# Patient Record
Sex: Female | Born: 1965 | Race: Black or African American | Hispanic: Yes | Smoking: Current every day smoker
Health system: Southern US, Community
[De-identification: ages and names within clinical notes are randomized; demographics above are authoritative.]

## PROBLEM LIST (undated history)

## (undated) DIAGNOSIS — M51369 Other intervertebral disc degeneration, lumbar region without mention of lumbar back pain or lower extremity pain: Secondary | ICD-10-CM

## (undated) DIAGNOSIS — I10 Essential (primary) hypertension: Secondary | ICD-10-CM

## (undated) DIAGNOSIS — R7303 Prediabetes: Secondary | ICD-10-CM

## (undated) DIAGNOSIS — F419 Anxiety disorder, unspecified: Secondary | ICD-10-CM

## (undated) DIAGNOSIS — F319 Bipolar disorder, unspecified: Secondary | ICD-10-CM

## (undated) DIAGNOSIS — F4310 Post-traumatic stress disorder, unspecified: Secondary | ICD-10-CM

## (undated) DIAGNOSIS — F32A Depression, unspecified: Secondary | ICD-10-CM

## (undated) DIAGNOSIS — G8929 Other chronic pain: Secondary | ICD-10-CM

## (undated) DIAGNOSIS — M5136 Other intervertebral disc degeneration, lumbar region: Secondary | ICD-10-CM

## (undated) HISTORY — DX: Depression, unspecified: F32.A

## (undated) HISTORY — DX: Prediabetes: R73.03

## (undated) HISTORY — DX: Other chronic pain: G89.29

## (undated) HISTORY — DX: Bipolar disorder, unspecified: F31.9

## (undated) HISTORY — PX: OTHER SURGICAL HISTORY: SHX169

## (undated) HISTORY — DX: Anxiety disorder, unspecified: F41.9

## (undated) HISTORY — PX: COLONOSCOPY: SHX174

## (undated) HISTORY — PX: CARPAL TUNNEL RELEASE: SHX101

## (undated) HISTORY — PX: PARTIAL HYSTERECTOMY: SHX80

## (undated) HISTORY — DX: Post-traumatic stress disorder, unspecified: F43.10

---

## 2004-12-19 ENCOUNTER — Ambulatory Visit: Payer: Self-pay | Admitting: Family Medicine

## 2005-06-27 ENCOUNTER — Ambulatory Visit (HOSPITAL_COMMUNITY): Admission: RE | Admit: 2005-06-27 | Discharge: 2005-06-27 | Payer: Self-pay | Admitting: General Surgery

## 2007-02-05 ENCOUNTER — Inpatient Hospital Stay (HOSPITAL_COMMUNITY): Admission: EM | Admit: 2007-02-05 | Discharge: 2007-02-07 | Payer: Self-pay | Admitting: Emergency Medicine

## 2007-02-05 ENCOUNTER — Encounter (INDEPENDENT_AMBULATORY_CARE_PROVIDER_SITE_OTHER): Payer: Self-pay | Admitting: Family Medicine

## 2007-02-17 ENCOUNTER — Ambulatory Visit: Payer: Self-pay | Admitting: Family Medicine

## 2007-02-17 DIAGNOSIS — D509 Iron deficiency anemia, unspecified: Secondary | ICD-10-CM | POA: Insufficient documentation

## 2007-02-17 DIAGNOSIS — M79609 Pain in unspecified limb: Secondary | ICD-10-CM | POA: Insufficient documentation

## 2007-02-17 DIAGNOSIS — I10 Essential (primary) hypertension: Secondary | ICD-10-CM | POA: Insufficient documentation

## 2007-02-17 DIAGNOSIS — F329 Major depressive disorder, single episode, unspecified: Secondary | ICD-10-CM | POA: Insufficient documentation

## 2007-02-17 LAB — CONVERTED CEMR LAB: Hemoglobin: 10.2 g/dL

## 2007-02-19 ENCOUNTER — Encounter: Payer: Self-pay | Admitting: Family Medicine

## 2007-03-04 ENCOUNTER — Ambulatory Visit: Payer: Self-pay | Admitting: Family Medicine

## 2007-03-04 DIAGNOSIS — F17200 Nicotine dependence, unspecified, uncomplicated: Secondary | ICD-10-CM | POA: Insufficient documentation

## 2007-03-04 LAB — CONVERTED CEMR LAB
Bilirubin Urine: NEGATIVE
Blood in Urine, dipstick: NEGATIVE
Glucose, Urine, Semiquant: NEGATIVE
Nitrite: NEGATIVE
Protein, U semiquant: 30
Specific Gravity, Urine: >=1.03
Urobilinogen, UA: 0.2
pH: 6

## 2007-03-05 ENCOUNTER — Telehealth (INDEPENDENT_AMBULATORY_CARE_PROVIDER_SITE_OTHER): Payer: Self-pay | Admitting: *Deleted

## 2007-03-05 ENCOUNTER — Encounter (INDEPENDENT_AMBULATORY_CARE_PROVIDER_SITE_OTHER): Payer: Self-pay | Admitting: Family Medicine

## 2007-03-05 LAB — CONVERTED CEMR LAB
ALT: 16 units/L (ref 0–35)
AST: 15 units/L (ref 0–37)
Albumin: 4.6 g/dL (ref 3.5–5.2)
Alkaline Phosphatase: 70 units/L (ref 39–117)
BUN: 10 mg/dL (ref 6–23)
Basophils Absolute: 0 10*3/uL (ref 0.0–0.1)
Basophils Relative: 0 % (ref 0–1)
CO2: 20 meq/L (ref 19–32)
Calcium: 9.5 mg/dL (ref 8.4–10.5)
Chloride: 107 meq/L (ref 96–112)
Cholesterol: 138 mg/dL (ref 0–200)
Creatinine, Ser: 0.89 mg/dL (ref 0.40–1.20)
Eosinophils Absolute: 0 10*3/uL (ref 0.0–0.7)
Eosinophils Relative: 1 % (ref 0–5)
Glucose, Bld: 101 mg/dL — ABNORMAL HIGH (ref 70–99)
HCT: 36.4 % (ref 36.0–46.0)
HDL: 35 mg/dL — ABNORMAL LOW (ref >39)
Hemoglobin: 10.9 g/dL — ABNORMAL LOW (ref 12.0–15.0)
LDL Cholesterol: 93 mg/dL (ref 0–99)
Lymphocytes Relative: 39 % (ref 12–46)
Lymphs Abs: 2 10*3/uL (ref 0.7–4.0)
MCHC: 29.9 g/dL — ABNORMAL LOW (ref 30.0–36.0)
MCV: 68.5 fL — ABNORMAL LOW (ref 78.0–100.0)
Monocytes Absolute: 0.2 10*3/uL (ref 0.1–1.0)
Monocytes Relative: 4 % (ref 3–12)
Neutro Abs: 2.9 10*3/uL (ref 1.7–7.7)
Neutrophils Relative %: 56 % (ref 43–77)
Platelets: 627 10*3/uL — ABNORMAL HIGH (ref 150–400)
Potassium: 3.9 meq/L (ref 3.5–5.3)
RBC: 5.31 M/uL — ABNORMAL HIGH (ref 3.87–5.11)
Sodium: 139 meq/L (ref 135–145)
TSH: 0.452 microintl units/mL (ref 0.350–5.50)
Total Bilirubin: 1 mg/dL (ref 0.3–1.2)
Total CHOL/HDL Ratio: 3.9
Total Protein: 7.5 g/dL (ref 6.0–8.3)
Triglycerides: 48 mg/dL (ref <150)
VLDL: 10 mg/dL (ref 0–40)
WBC: 5.2 10*3/uL (ref 4.0–10.5)

## 2007-03-10 ENCOUNTER — Encounter (INDEPENDENT_AMBULATORY_CARE_PROVIDER_SITE_OTHER): Payer: Self-pay | Admitting: Family Medicine

## 2007-03-10 ENCOUNTER — Encounter (HOSPITAL_COMMUNITY): Admission: RE | Admit: 2007-03-10 | Discharge: 2007-04-09 | Payer: Self-pay | Admitting: Oncology

## 2007-03-10 ENCOUNTER — Ambulatory Visit (HOSPITAL_COMMUNITY): Payer: Self-pay | Admitting: Oncology

## 2007-03-27 ENCOUNTER — Ambulatory Visit (HOSPITAL_COMMUNITY): Admission: RE | Admit: 2007-03-27 | Discharge: 2007-03-27 | Payer: Self-pay | Admitting: Oncology

## 2007-04-02 ENCOUNTER — Ambulatory Visit: Payer: Self-pay | Admitting: Family Medicine

## 2007-04-02 ENCOUNTER — Telehealth (INDEPENDENT_AMBULATORY_CARE_PROVIDER_SITE_OTHER): Payer: Self-pay | Admitting: *Deleted

## 2007-04-02 LAB — CONVERTED CEMR LAB

## 2007-04-08 ENCOUNTER — Encounter (INDEPENDENT_AMBULATORY_CARE_PROVIDER_SITE_OTHER): Payer: Self-pay | Admitting: Family Medicine

## 2007-04-13 ENCOUNTER — Encounter (HOSPITAL_COMMUNITY): Admission: RE | Admit: 2007-04-13 | Discharge: 2007-05-13 | Payer: Self-pay | Admitting: Oncology

## 2007-05-04 ENCOUNTER — Telehealth (INDEPENDENT_AMBULATORY_CARE_PROVIDER_SITE_OTHER): Payer: Self-pay | Admitting: Family Medicine

## 2007-05-04 ENCOUNTER — Emergency Department (HOSPITAL_COMMUNITY): Admission: EM | Admit: 2007-05-04 | Discharge: 2007-05-04 | Payer: Self-pay | Admitting: Emergency Medicine

## 2007-05-07 ENCOUNTER — Ambulatory Visit: Payer: Self-pay | Admitting: Family Medicine

## 2007-05-08 ENCOUNTER — Encounter (INDEPENDENT_AMBULATORY_CARE_PROVIDER_SITE_OTHER): Payer: Self-pay | Admitting: Family Medicine

## 2007-05-13 ENCOUNTER — Inpatient Hospital Stay (HOSPITAL_COMMUNITY): Admission: RE | Admit: 2007-05-13 | Discharge: 2007-05-15 | Payer: Self-pay | Admitting: Obstetrics & Gynecology

## 2007-05-13 ENCOUNTER — Encounter: Payer: Self-pay | Admitting: Obstetrics & Gynecology

## 2007-05-21 ENCOUNTER — Telehealth (INDEPENDENT_AMBULATORY_CARE_PROVIDER_SITE_OTHER): Payer: Self-pay | Admitting: *Deleted

## 2007-05-22 ENCOUNTER — Encounter (HOSPITAL_COMMUNITY): Admission: RE | Admit: 2007-05-22 | Discharge: 2007-06-21 | Payer: Self-pay | Admitting: Oncology

## 2007-05-22 ENCOUNTER — Ambulatory Visit (HOSPITAL_COMMUNITY): Payer: Self-pay | Admitting: Oncology

## 2007-05-25 ENCOUNTER — Encounter (INDEPENDENT_AMBULATORY_CARE_PROVIDER_SITE_OTHER): Payer: Self-pay | Admitting: Family Medicine

## 2007-06-04 ENCOUNTER — Emergency Department (HOSPITAL_COMMUNITY): Admission: EM | Admit: 2007-06-04 | Discharge: 2007-06-05 | Payer: Self-pay | Admitting: Emergency Medicine

## 2007-06-05 ENCOUNTER — Ambulatory Visit: Payer: Self-pay | Admitting: Family Medicine

## 2007-06-05 DIAGNOSIS — F410 Panic disorder [episodic paroxysmal anxiety] without agoraphobia: Secondary | ICD-10-CM | POA: Insufficient documentation

## 2007-06-08 ENCOUNTER — Telehealth (INDEPENDENT_AMBULATORY_CARE_PROVIDER_SITE_OTHER): Payer: Self-pay | Admitting: Family Medicine

## 2007-06-15 ENCOUNTER — Telehealth (INDEPENDENT_AMBULATORY_CARE_PROVIDER_SITE_OTHER): Payer: Self-pay | Admitting: *Deleted

## 2007-06-15 ENCOUNTER — Ambulatory Visit: Payer: Self-pay | Admitting: Family Medicine

## 2007-06-26 ENCOUNTER — Encounter
Admission: RE | Admit: 2007-06-26 | Discharge: 2007-07-01 | Payer: Self-pay | Admitting: Physical Medicine and Rehabilitation

## 2007-06-29 ENCOUNTER — Ambulatory Visit: Payer: Self-pay | Admitting: Family Medicine

## 2007-06-29 DIAGNOSIS — F1210 Cannabis abuse, uncomplicated: Secondary | ICD-10-CM | POA: Insufficient documentation

## 2007-06-30 ENCOUNTER — Encounter (INDEPENDENT_AMBULATORY_CARE_PROVIDER_SITE_OTHER): Payer: Self-pay | Admitting: Family Medicine

## 2007-06-30 ENCOUNTER — Telehealth (INDEPENDENT_AMBULATORY_CARE_PROVIDER_SITE_OTHER): Payer: Self-pay | Admitting: *Deleted

## 2007-06-30 LAB — CONVERTED CEMR LAB
BUN: 11 mg/dL (ref 6–23)
CO2: 20 meq/L (ref 19–32)
Calcium: 9 mg/dL (ref 8.4–10.5)
Chloride: 111 meq/L (ref 96–112)
Creatinine, Ser: 0.81 mg/dL (ref 0.40–1.20)
Glucose, Bld: 92 mg/dL (ref 70–99)
Potassium: 3.7 meq/L (ref 3.5–5.3)
Sodium: 141 meq/L (ref 135–145)

## 2007-07-01 ENCOUNTER — Encounter (INDEPENDENT_AMBULATORY_CARE_PROVIDER_SITE_OTHER): Payer: Self-pay | Admitting: Family Medicine

## 2007-07-01 ENCOUNTER — Telehealth (INDEPENDENT_AMBULATORY_CARE_PROVIDER_SITE_OTHER): Payer: Self-pay | Admitting: Family Medicine

## 2007-07-01 ENCOUNTER — Ambulatory Visit (HOSPITAL_COMMUNITY): Admission: RE | Admit: 2007-07-01 | Discharge: 2007-07-01 | Payer: Self-pay | Admitting: Obstetrics and Gynecology

## 2007-07-01 ENCOUNTER — Ambulatory Visit: Payer: Self-pay | Admitting: Physical Medicine and Rehabilitation

## 2007-07-09 ENCOUNTER — Encounter (INDEPENDENT_AMBULATORY_CARE_PROVIDER_SITE_OTHER): Payer: Self-pay | Admitting: Family Medicine

## 2007-07-10 ENCOUNTER — Telehealth (INDEPENDENT_AMBULATORY_CARE_PROVIDER_SITE_OTHER): Payer: Self-pay | Admitting: Family Medicine

## 2007-08-18 ENCOUNTER — Encounter (INDEPENDENT_AMBULATORY_CARE_PROVIDER_SITE_OTHER): Payer: Self-pay | Admitting: Family Medicine

## 2008-04-02 ENCOUNTER — Emergency Department (HOSPITAL_COMMUNITY): Admission: EM | Admit: 2008-04-02 | Discharge: 2008-04-02 | Payer: Self-pay | Admitting: Emergency Medicine

## 2008-04-11 ENCOUNTER — Ambulatory Visit (HOSPITAL_BASED_OUTPATIENT_CLINIC_OR_DEPARTMENT_OTHER): Admission: RE | Admit: 2008-04-11 | Discharge: 2008-04-11 | Payer: Self-pay | Admitting: Otolaryngology

## 2008-05-23 ENCOUNTER — Ambulatory Visit (HOSPITAL_COMMUNITY): Payer: Self-pay | Admitting: Oncology

## 2008-10-06 ENCOUNTER — Inpatient Hospital Stay (HOSPITAL_COMMUNITY): Admission: RE | Admit: 2008-10-06 | Discharge: 2008-10-07 | Payer: Self-pay | Admitting: Orthopaedic Surgery

## 2008-11-08 ENCOUNTER — Encounter (HOSPITAL_COMMUNITY): Admission: RE | Admit: 2008-11-08 | Discharge: 2008-11-17 | Payer: Self-pay | Admitting: Orthopaedic Surgery

## 2008-11-22 ENCOUNTER — Encounter (HOSPITAL_COMMUNITY): Admission: RE | Admit: 2008-11-22 | Discharge: 2008-12-22 | Payer: Self-pay | Admitting: Orthopaedic Surgery

## 2008-12-06 ENCOUNTER — Emergency Department (HOSPITAL_COMMUNITY): Admission: EM | Admit: 2008-12-06 | Discharge: 2008-12-06 | Payer: Self-pay | Admitting: Emergency Medicine

## 2008-12-23 ENCOUNTER — Encounter (HOSPITAL_COMMUNITY): Admission: RE | Admit: 2008-12-23 | Discharge: 2009-01-22 | Payer: Self-pay | Admitting: Orthopaedic Surgery

## 2009-01-24 ENCOUNTER — Ambulatory Visit (HOSPITAL_COMMUNITY): Admission: RE | Admit: 2009-01-24 | Discharge: 2009-01-24 | Payer: Self-pay

## 2009-01-24 ENCOUNTER — Encounter (HOSPITAL_COMMUNITY): Admission: RE | Admit: 2009-01-24 | Discharge: 2009-02-17 | Payer: Self-pay | Admitting: Orthopaedic Surgery

## 2009-02-21 ENCOUNTER — Encounter (HOSPITAL_COMMUNITY): Admission: RE | Admit: 2009-02-21 | Discharge: 2009-03-23 | Payer: Self-pay | Admitting: Orthopaedic Surgery

## 2009-03-12 ENCOUNTER — Emergency Department (HOSPITAL_COMMUNITY): Admission: EM | Admit: 2009-03-12 | Discharge: 2009-03-12 | Payer: Self-pay | Admitting: Emergency Medicine

## 2009-03-28 ENCOUNTER — Encounter (HOSPITAL_COMMUNITY): Admission: RE | Admit: 2009-03-28 | Discharge: 2009-04-27 | Payer: Self-pay | Admitting: Orthopaedic Surgery

## 2009-04-28 ENCOUNTER — Encounter (HOSPITAL_COMMUNITY): Admission: RE | Admit: 2009-04-28 | Discharge: 2009-05-28 | Payer: Self-pay | Admitting: Orthopaedic Surgery

## 2009-07-26 ENCOUNTER — Ambulatory Visit (HOSPITAL_COMMUNITY)
Admission: RE | Admit: 2009-07-26 | Discharge: 2009-07-26 | Payer: Self-pay | Admitting: Physical Medicine and Rehabilitation

## 2009-08-07 ENCOUNTER — Emergency Department (HOSPITAL_COMMUNITY): Admission: EM | Admit: 2009-08-07 | Discharge: 2009-08-07 | Payer: Self-pay | Admitting: Emergency Medicine

## 2009-09-17 ENCOUNTER — Emergency Department (HOSPITAL_COMMUNITY): Admission: EM | Admit: 2009-09-17 | Discharge: 2009-09-17 | Payer: Self-pay | Admitting: Emergency Medicine

## 2009-11-28 ENCOUNTER — Encounter
Admission: RE | Admit: 2009-11-28 | Discharge: 2009-11-28 | Payer: Self-pay | Admitting: Physical Medicine and Rehabilitation

## 2010-03-20 NOTE — Letter (Signed)
Summary: Letter lab/xray  Letter lab/xray   Imported By: Curtis Sites 07/02/2007 09:47:33  _____________________________________________________________________  External Attachment:    Type:   Image     Comment:   External Document

## 2010-03-20 NOTE — Progress Notes (Signed)
Summary: PRESCRIPTION  Phone Note Call from Patient   Caller: Patient Summary of Call: PATIENT HAS LOST HER PRESCRIPTION FOR LABETALOL HCL 400MG , SHE SAID HE SWITCHED HER TO 400MG  HER LAST VISIT. SHE SAID YOU COULD CALL IT INTO [facility] [place] THERE NUMBER IS [id number].  HER NUMBER IS [id number]. Initial call taken by: Curtis Sites,  May 21, 2007 11:24 AM  Follow-up for Phone Call        script called into pharmacy. Follow-up by: Sherilyn Banker,  May 21, 2007 11:45 AM
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
## 2010-03-20 NOTE — Progress Notes (Signed)
Summary: Going to [place], not sure when will be back  Phone Note Call from Patient   Reason for Call: Talk to Nurse Summary of Call: patient called asking for Selena Batten,  I advised her that she wasn't here and she said she needed to cancel her appt for June 30th and any future appts because she was in [place]. Her mother is in the hospital and might need to go to a nursing home and didn't know when she would be coming back home. She said she had enough medication to last her but she was going to see her other dr and see if he would give her a Rx for her Exforge because we only gave her samples of it. I advised her that she would need to let us know when she gets back so I can request that note from that dr so we can have it for when she follows up with dr Erby Pian. Initial call taken by: Donneta Romberg,  Jul 10, 2007 9:05 AM  Follow-up for Phone Call        Noted Follow-up by: Franchot Heidelberg MD,  Jul 10, 2007 2:13 PM
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
## 2010-03-20 NOTE — Letter (Signed)
Summary: Discharge Summary  Discharge Summary   Imported By: Donneta Romberg 02/20/2007 15:39:31  _____________________________________________________________________  External Attachment:    Type:   Image     Comment:   External Document

## 2010-03-20 NOTE — Consult Note (Signed)
Summary: Pain Clinic  Pain Clinic   Imported By: Altamease Oiler 07/29/2007 10:47:17  _____________________________________________________________________  External Attachment:    Type:   Image     Comment:   External Document
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
## 2010-03-20 NOTE — Progress Notes (Signed)
Summary: called to check on pt  Phone Note Outgoing Call   Summary of Call: Caled to check on pt. She states that she is feeling much better and she was very appreciative that we worked her in last fri. She will keep her appt on Apr 27th. Initial call taken by: Sherilyn Banker,  June 08, 2007 10:32 AM  Follow-up for Phone Call        Noted. Follow-up by: Franchot Heidelberg MD,  June 08, 2007 10:38 AM
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
## 2010-03-20 NOTE — Progress Notes (Signed)
Summary: [facility] referral  Phone Note Outgoing Call   Call placed by: Sonny Dandy,  June 15, 2007 11:08 AM Summary of Call: records faxed to [facility], message left for patient to return call on answering machine .................................................................Marland KitchenMarland KitchenSonny Dandy  June 15, 2007 11:09 AM  referral info given to patient Sonny Dandy  June 17, 2007 11:19 AM  appointment set for 08/04/07 at 1200pm [facility]  June 18, 2007 8:04 AM  Initial call taken by: Sonny Dandy,  June 18, 2007 8:04 AM

## 2010-03-20 NOTE — Letter (Signed)
Summary: rpc chart  rpc chart   Imported By: Curtis Sites 11/22/2009 17:00:15  _____________________________________________________________________  External Attachment:    Type:   Image     Comment:   External Document
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
## 2010-03-20 NOTE — Letter (Signed)
Summary: Med list  [facility]  [address]   [place], [place] [address]   Phone: [phone]  Fax: [phone]    Patient Information  For [name]         Your current medications include:  1)  EXFORGE 10-320 MG  TABS (AMLODIPINE BESYLATE-VALSARTAN) One daily 2)  LABETALOL HCL 200 MG  TABS (LABETALOL HCL) Two two times a day 3)  KLONOPIN 1 MG  TABS (CLONAZEPAM) two times a day 4)  ZOLOFT 50 MG  TABS (SERTRALINE HCL) One at bedtime 5)  NEURONTIN 600 MG  TABS (GABAPENTIN) three times a day     Please contact us if any concerns.  Thank you    CF

## 2010-03-20 NOTE — Letter (Signed)
[facility]   Imported By: Curtis Sites 06/05/2007 16:32:17  _____________________________________________________________________  External Attachment:    Type:   Image     Comment:   External Document

## 2010-03-20 NOTE — Progress Notes (Signed)
Summary: Cancer Center Note  Cancer Center Note   Imported By: Lutricia Horsfall 03/19/2007 15:50:06  _____________________________________________________________________  External Attachment:    Type:   Image     Comment:   External Document
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
## 2010-03-20 NOTE — Letter (Signed)
Summary: dss  dss   Imported By: Curtis Sites 07/29/2007 13:37:27  _____________________________________________________________________  External Attachment:    Type:   Image     Comment:   External Document

## 2010-03-20 NOTE — Progress Notes (Signed)
Summary: Pain Clinic referral  Phone Note Outgoing Call   Call placed by: Sonny Dandy,  April 02, 2007 9:52 AM Summary of Call: records faxed to [facility] in [place], patient notified. .................................................................Marland KitchenMarland KitchenBritt Bottom Long  April 02, 2007 9:53 AM  checked with pain clinic and no appointment has been set as yet .................................................................Marland KitchenMarland KitchenSonny Dandy  April 09, 2007 3:42 PM   called and still in the review process! .................................................................Marland KitchenMarland KitchenBritt Bottom Long  April 14, 2007 1:18 PM  called and asked about referral and it is still in review! .................................................................Marland KitchenMarland KitchenSonny Dandy  April 23, 2007 3:44 PM   Follow-up for Phone Call        recieved appointment for patient 04/29/07 at 830am, patient notified. Follow-up by: Sonny Dandy,  April 24, 2007 1:07 PM

## 2010-03-20 NOTE — Letter (Signed)
Summary: [facility]  [facility]   Imported By: Curtis Sites 06/09/2007 11:48:19  _____________________________________________________________________  External Attachment:    Type:   Image     Comment:   External Document

## 2010-03-20 NOTE — Progress Notes (Signed)
Summary: 06/29/07 lab results letter mailed  Phone Note Outgoing Call   Call placed by: Sonny Dandy,  Jun 30, 2007 8:49 AM Summary of Call: message left for patient to return call on answering machine Sonny Dandy  Jun 30, 2007 8:49 AM  called, no answer, letter mailed Sonny Dandy  Jul 01, 2007 8:02 AM  Initial call taken by: Sonny Dandy,  Jul 01, 2007 8:02 AM

## 2010-03-23 NOTE — Letter (Signed)
Summary: Second Missed Appointment  Second Missed Appointment   Imported By: Lutricia Horsfall 09/03/2007 15:28:30  _____________________________________________________________________  External Attachment:    Type:   Image     Comment:   External Document

## 2010-05-05 LAB — URINALYSIS, ROUTINE W REFLEX MICROSCOPIC
Bilirubin Urine: NEGATIVE
Glucose, UA: NEGATIVE mg/dL
Hgb urine dipstick: NEGATIVE
Ketones, ur: NEGATIVE mg/dL
Nitrite: NEGATIVE
Protein, ur: NEGATIVE mg/dL
Specific Gravity, Urine: 1.02 (ref 1.005–1.030)
Urobilinogen, UA: 0.2 mg/dL (ref 0.0–1.0)
pH: 7 (ref 5.0–8.0)

## 2010-05-05 LAB — BASIC METABOLIC PANEL
BUN: 10 mg/dL (ref 6–23)
CO2: 25 mEq/L (ref 19–32)
Calcium: 9.3 mg/dL (ref 8.4–10.5)
Chloride: 107 mEq/L (ref 96–112)
Creatinine, Ser: 0.72 mg/dL (ref 0.4–1.2)
GFR calc Af Amer: >60 mL/min (ref >60)
GFR calc non Af Amer: >60 mL/min (ref >60)
Glucose, Bld: 115 mg/dL — ABNORMAL HIGH (ref 70–99)
Potassium: 4.1 mEq/L (ref 3.5–5.1)
Sodium: 136 mEq/L (ref 135–145)

## 2010-05-05 LAB — CBC
HCT: 42.3 % (ref 36.0–46.0)
Hemoglobin: 13.8 g/dL (ref 12.0–15.0)
MCH: 28.2 pg (ref 26.0–34.0)
MCHC: 32.7 g/dL (ref 30.0–36.0)
MCV: 86.2 fL (ref 78.0–100.0)
Platelets: 383 10*3/uL (ref 150–400)
RBC: 4.9 MIL/uL (ref 3.87–5.11)
RDW: 15.8 % — ABNORMAL HIGH (ref 11.5–15.5)
WBC: 4.9 10*3/uL (ref 4.0–10.5)

## 2010-05-05 LAB — DIFFERENTIAL
Basophils Absolute: 0 10*3/uL (ref 0.0–0.1)
Basophils Relative: 0 % (ref 0–1)
Eosinophils Absolute: 0 10*3/uL (ref 0.0–0.7)
Eosinophils Relative: 1 % (ref 0–5)
Lymphocytes Relative: 32 % (ref 12–46)
Lymphs Abs: 1.6 10*3/uL (ref 0.7–4.0)
Monocytes Absolute: 0.3 10*3/uL (ref 0.1–1.0)
Monocytes Relative: 6 % (ref 3–12)
Neutro Abs: 3 10*3/uL (ref 1.7–7.7)
Neutrophils Relative %: 61 % (ref 43–77)

## 2010-05-06 LAB — COMPREHENSIVE METABOLIC PANEL
ALT: 24 U/L (ref 0–35)
AST: 19 U/L (ref 0–37)
Albumin: 4.2 g/dL (ref 3.5–5.2)
Alkaline Phosphatase: 73 U/L (ref 39–117)
BUN: 7 mg/dL (ref 6–23)
CO2: 23 mEq/L (ref 19–32)
Calcium: 8.8 mg/dL (ref 8.4–10.5)
Chloride: 103 mEq/L (ref 96–112)
Creatinine, Ser: 0.75 mg/dL (ref 0.4–1.2)
GFR calc Af Amer: >60 mL/min (ref >60)
GFR calc non Af Amer: >60 mL/min (ref >60)
Glucose, Bld: 111 mg/dL — ABNORMAL HIGH (ref 70–99)
Potassium: 3.6 mEq/L (ref 3.5–5.1)
Sodium: 137 mEq/L (ref 135–145)
Total Bilirubin: 0.6 mg/dL (ref 0.3–1.2)
Total Protein: 7.4 g/dL (ref 6.0–8.3)

## 2010-05-06 LAB — CBC
HCT: 44.8 % (ref 36.0–46.0)
HCT: 47.4 % — ABNORMAL HIGH (ref 36.0–46.0)
Hemoglobin: 14.3 g/dL (ref 12.0–15.0)
Hemoglobin: 15.5 g/dL — ABNORMAL HIGH (ref 12.0–15.0)
MCHC: 32 g/dL (ref 30.0–36.0)
MCHC: 32.7 g/dL (ref 30.0–36.0)
MCV: 84.8 fL (ref 78.0–100.0)
MCV: 86.5 fL (ref 78.0–100.0)
Platelets: 346 10*3/uL (ref 150–400)
Platelets: 361 10*3/uL (ref 150–400)
RBC: 5.28 MIL/uL — ABNORMAL HIGH (ref 3.87–5.11)
RBC: 5.48 MIL/uL — ABNORMAL HIGH (ref 3.87–5.11)
RDW: 16.4 % — ABNORMAL HIGH (ref 11.5–15.5)
RDW: 15 % (ref 11.5–15.5)
WBC: 4.1 10*3/uL (ref 4.0–10.5)
WBC: 4.3 10*3/uL (ref 4.0–10.5)

## 2010-05-06 LAB — URINALYSIS, ROUTINE W REFLEX MICROSCOPIC
Bilirubin Urine: NEGATIVE
Glucose, UA: NEGATIVE mg/dL
Glucose, UA: NEGATIVE mg/dL
Hgb urine dipstick: NEGATIVE
Hgb urine dipstick: NEGATIVE
Ketones, ur: NEGATIVE mg/dL
Nitrite: NEGATIVE
Nitrite: NEGATIVE
Protein, ur: NEGATIVE mg/dL
Protein, ur: NEGATIVE mg/dL
Specific Gravity, Urine: 1.025 (ref 1.005–1.030)
Specific Gravity, Urine: >1.03 — ABNORMAL HIGH (ref 1.005–1.030)
Urobilinogen, UA: 0.2 mg/dL (ref 0.0–1.0)
Urobilinogen, UA: 1 mg/dL (ref 0.0–1.0)
pH: 5.5 (ref 5.0–8.0)
pH: 5.5 (ref 5.0–8.0)

## 2010-05-06 LAB — POCT CARDIAC MARKERS
CKMB, poc: <1 ng/mL — ABNORMAL LOW (ref 1.0–8.0)
Myoglobin, poc: 139 ng/mL (ref 12–200)
Troponin i, poc: <0.05 ng/mL (ref 0.00–0.09)

## 2010-05-06 LAB — DIFFERENTIAL
Basophils Absolute: 0 10*3/uL (ref 0.0–0.1)
Basophils Absolute: 0 10*3/uL (ref 0.0–0.1)
Basophils Relative: 0 % (ref 0–1)
Basophils Relative: 1 % (ref 0–1)
Eosinophils Absolute: 0 10*3/uL (ref 0.0–0.7)
Eosinophils Absolute: 0 10*3/uL (ref 0.0–0.7)
Eosinophils Relative: 1 % (ref 0–5)
Eosinophils Relative: 0 % (ref 0–5)
Lymphocytes Relative: 46 % (ref 12–46)
Lymphocytes Relative: 50 % — ABNORMAL HIGH (ref 12–46)
Lymphs Abs: 1.9 10*3/uL (ref 0.7–4.0)
Lymphs Abs: 2.1 10*3/uL (ref 0.7–4.0)
Monocytes Absolute: 0.2 10*3/uL (ref 0.1–1.0)
Monocytes Absolute: 0.3 10*3/uL (ref 0.1–1.0)
Monocytes Relative: 5 % (ref 3–12)
Monocytes Relative: 8 % (ref 3–12)
Neutro Abs: 1.9 10*3/uL (ref 1.7–7.7)
Neutro Abs: 1.8 10*3/uL (ref 1.7–7.7)
Neutrophils Relative %: 47 % (ref 43–77)
Neutrophils Relative %: 42 % — ABNORMAL LOW (ref 43–77)

## 2010-05-06 LAB — BASIC METABOLIC PANEL
BUN: 25 mg/dL — ABNORMAL HIGH (ref 6–23)
CO2: 28 mEq/L (ref 19–32)
Calcium: 9.9 mg/dL (ref 8.4–10.5)
Chloride: 98 mEq/L (ref 96–112)
Creatinine, Ser: 1.64 mg/dL — ABNORMAL HIGH (ref 0.4–1.2)
GFR calc Af Amer: 41 mL/min — ABNORMAL LOW (ref >60)
GFR calc non Af Amer: 34 mL/min — ABNORMAL LOW (ref >60)
Glucose, Bld: 110 mg/dL — ABNORMAL HIGH (ref 70–99)
Potassium: 3.4 mEq/L — ABNORMAL LOW (ref 3.5–5.1)
Sodium: 138 mEq/L (ref 135–145)

## 2010-05-24 LAB — URINALYSIS, ROUTINE W REFLEX MICROSCOPIC
Bilirubin Urine: NEGATIVE
Glucose, UA: NEGATIVE mg/dL
Hgb urine dipstick: NEGATIVE
Ketones, ur: NEGATIVE mg/dL
Nitrite: NEGATIVE
Protein, ur: NEGATIVE mg/dL
Specific Gravity, Urine: 1.015 (ref 1.005–1.030)
Urobilinogen, UA: 0.2 mg/dL (ref 0.0–1.0)
pH: 7.5 (ref 5.0–8.0)

## 2010-05-24 LAB — CBC
HCT: 41.1 % (ref 36.0–46.0)
Hemoglobin: 13.7 g/dL (ref 12.0–15.0)
MCHC: 33.4 g/dL (ref 30.0–36.0)
MCV: 86.3 fL (ref 78.0–100.0)
Platelets: 357 10*3/uL (ref 150–400)
RBC: 4.76 MIL/uL (ref 3.87–5.11)
RDW: 15.3 % (ref 11.5–15.5)
WBC: 4.3 10*3/uL (ref 4.0–10.5)

## 2010-05-24 LAB — WET PREP, GENITAL
Trich, Wet Prep: NONE SEEN
WBC, Wet Prep HPF POC: NONE SEEN
Yeast Wet Prep HPF POC: NONE SEEN

## 2010-05-24 LAB — BASIC METABOLIC PANEL
BUN: 7 mg/dL (ref 6–23)
CO2: 27 mEq/L (ref 19–32)
Calcium: 9.1 mg/dL (ref 8.4–10.5)
Chloride: 104 mEq/L (ref 96–112)
Creatinine, Ser: 0.67 mg/dL (ref 0.4–1.2)
GFR calc Af Amer: >60 mL/min (ref >60)
GFR calc non Af Amer: >60 mL/min (ref >60)
Glucose, Bld: 102 mg/dL — ABNORMAL HIGH (ref 70–99)
Potassium: 3.5 mEq/L (ref 3.5–5.1)
Sodium: 136 mEq/L (ref 135–145)

## 2010-05-24 LAB — DIFFERENTIAL
Basophils Absolute: 0 10*3/uL (ref 0.0–0.1)
Basophils Relative: 0 % (ref 0–1)
Eosinophils Absolute: 0 10*3/uL (ref 0.0–0.7)
Eosinophils Relative: 1 % (ref 0–5)
Lymphocytes Relative: 44 % (ref 12–46)
Lymphs Abs: 1.9 10*3/uL (ref 0.7–4.0)
Monocytes Absolute: 0.3 10*3/uL (ref 0.1–1.0)
Monocytes Relative: 6 % (ref 3–12)
Neutro Abs: 2.1 10*3/uL (ref 1.7–7.7)
Neutrophils Relative %: 49 % (ref 43–77)

## 2010-05-26 LAB — DIFFERENTIAL
Basophils Absolute: 0 10*3/uL (ref 0.0–0.1)
Basophils Relative: 0 % (ref 0–1)
Eosinophils Absolute: 0 10*3/uL (ref 0.0–0.7)
Eosinophils Relative: 1 % (ref 0–5)
Lymphocytes Relative: 32 % (ref 12–46)
Lymphs Abs: 1.3 10*3/uL (ref 0.7–4.0)
Monocytes Absolute: 0.3 10*3/uL (ref 0.1–1.0)
Monocytes Relative: 6 % (ref 3–12)
Neutro Abs: 2.4 10*3/uL (ref 1.7–7.7)
Neutrophils Relative %: 60 % (ref 43–77)

## 2010-05-26 LAB — COMPREHENSIVE METABOLIC PANEL
ALT: 26 U/L (ref 0–35)
AST: 26 U/L (ref 0–37)
Albumin: 3.9 g/dL (ref 3.5–5.2)
Alkaline Phosphatase: 74 U/L (ref 39–117)
BUN: 15 mg/dL (ref 6–23)
CO2: 26 mEq/L (ref 19–32)
Calcium: 9.1 mg/dL (ref 8.4–10.5)
Chloride: 106 mEq/L (ref 96–112)
Creatinine, Ser: 0.78 mg/dL (ref 0.4–1.2)
GFR calc Af Amer: >60 mL/min (ref >60)
GFR calc non Af Amer: >60 mL/min (ref >60)
Glucose, Bld: 114 mg/dL — ABNORMAL HIGH (ref 70–99)
Potassium: 3.9 mEq/L (ref 3.5–5.1)
Sodium: 137 mEq/L (ref 135–145)
Total Bilirubin: 0.7 mg/dL (ref 0.3–1.2)
Total Protein: 6.4 g/dL (ref 6.0–8.3)

## 2010-05-26 LAB — URINALYSIS, ROUTINE W REFLEX MICROSCOPIC
Bilirubin Urine: NEGATIVE
Glucose, UA: NEGATIVE mg/dL
Hgb urine dipstick: NEGATIVE
Ketones, ur: NEGATIVE mg/dL
Nitrite: NEGATIVE
Protein, ur: NEGATIVE mg/dL
Specific Gravity, Urine: 1.03 (ref 1.005–1.030)
Urobilinogen, UA: 0.2 mg/dL (ref 0.0–1.0)
pH: 6 (ref 5.0–8.0)

## 2010-05-26 LAB — CBC
HCT: 39.5 % (ref 36.0–46.0)
Hemoglobin: 13.3 g/dL (ref 12.0–15.0)
MCHC: 33.7 g/dL (ref 30.0–36.0)
MCV: 87.7 fL (ref 78.0–100.0)
Platelets: 276 10*3/uL (ref 150–400)
RBC: 4.51 MIL/uL (ref 3.87–5.11)
RDW: 16.2 % — ABNORMAL HIGH (ref 11.5–15.5)
WBC: 4.1 10*3/uL (ref 4.0–10.5)

## 2010-06-05 LAB — POCT I-STAT, CHEM 8
BUN: 13 mg/dL (ref 6–23)
Calcium, Ion: 1.03 mmol/L — ABNORMAL LOW (ref 1.12–1.32)
Chloride: 109 mEq/L (ref 96–112)
Creatinine, Ser: 0.8 mg/dL (ref 0.4–1.2)
Glucose, Bld: 120 mg/dL — ABNORMAL HIGH (ref 70–99)
HCT: 46 % (ref 36.0–46.0)
Hemoglobin: 15.6 g/dL — ABNORMAL HIGH (ref 12.0–15.0)
Potassium: 3.8 mEq/L (ref 3.5–5.1)
Sodium: 141 mEq/L (ref 135–145)
TCO2: 22 mmol/L (ref 0–100)

## 2010-06-05 LAB — RAPID STREP SCREEN (MED CTR MEBANE ONLY): Streptococcus, Group A Screen (Direct): NEGATIVE

## 2010-06-06 ENCOUNTER — Emergency Department (HOSPITAL_COMMUNITY): Payer: Medicare Other

## 2010-06-06 ENCOUNTER — Emergency Department (HOSPITAL_COMMUNITY)
Admission: EM | Admit: 2010-06-06 | Discharge: 2010-06-06 | Disposition: A | Discharge status: Home / Self Care | Payer: Medicare Other | Attending: Emergency Medicine | Admitting: Emergency Medicine

## 2010-06-06 ENCOUNTER — Encounter (HOSPITAL_COMMUNITY): Payer: Self-pay | Admitting: Radiology

## 2010-06-06 DIAGNOSIS — IMO0002 Reserved for concepts with insufficient information to code with codable children: Secondary | ICD-10-CM | POA: Insufficient documentation

## 2010-06-06 DIAGNOSIS — I10 Essential (primary) hypertension: Secondary | ICD-10-CM | POA: Insufficient documentation

## 2010-06-06 DIAGNOSIS — G43909 Migraine, unspecified, not intractable, without status migrainosus: Secondary | ICD-10-CM | POA: Insufficient documentation

## 2010-06-06 DIAGNOSIS — J40 Bronchitis, not specified as acute or chronic: Secondary | ICD-10-CM | POA: Insufficient documentation

## 2010-06-06 DIAGNOSIS — F410 Panic disorder [episodic paroxysmal anxiety] without agoraphobia: Secondary | ICD-10-CM | POA: Insufficient documentation

## 2010-06-06 DIAGNOSIS — R0602 Shortness of breath: Secondary | ICD-10-CM | POA: Insufficient documentation

## 2010-06-06 DIAGNOSIS — I739 Peripheral vascular disease, unspecified: Secondary | ICD-10-CM | POA: Insufficient documentation

## 2010-06-06 DIAGNOSIS — G589 Mononeuropathy, unspecified: Secondary | ICD-10-CM | POA: Insufficient documentation

## 2010-06-06 DIAGNOSIS — Z79899 Other long term (current) drug therapy: Secondary | ICD-10-CM | POA: Insufficient documentation

## 2010-06-06 HISTORY — DX: Essential (primary) hypertension: I10
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
## 2010-07-03 NOTE — Discharge Summary (Signed)
[name], [name]                ACCOUNT NO.:  [id number]   MEDICAL RECORD NO.:  [id number]          PATIENT TYPE:  INP   LOCATION:  A303                          FACILITY:  [facility]   PHYSICIAN:  Marcello Moores, MD   DATE OF BIRTH:  08-20-1965   DATE OF ADMISSION:  02/05/2007  DATE OF DISCHARGE:  12/20/2008LH                               DISCHARGE SUMMARY   PRIMARY MEDICAL DOCTOR:  Unassigned.   DISCHARGE DIAGNOSES:  1. Symptomatic anemia, status post transfusion, improved.  2. Macrocytic anemia secondary to heavy irregular periods.  Status      post transfusion and was evaluated by the gynecologist, Dr.      Emelda Fear, and will have follow-up with him in 2 months.  Stool      guaiac negative 2 times.  3. Hypertension, fairly controlled.  4. Low back pain, on medications.  5. Anemia.  She will have follow-up with Dr. Mariel Sleet as well, after      2 days in his specialty clinic.   HOME MEDICATIONS:  She will be discharged with:  1. Norvasc 10 mg p.o. daily.  2. Ferrous sulfate 325 mg p.o. t.i.d.  3. Neurontin 100 mg p.o. b.i.d.  4. Vicodin 1 tablet p.o. every 6 hours p.r.n.   HOSPITAL COURSE:  [name] is a 45 year old female patient who moved  from [place] recently to this area and came with generalized weakness,  dizziness and shortness of breath and was found to have a hemoglobin  level is 6.1 and was admitted with symptomatic anemia.  Gave history of  very heavy irregular periods and stated that she was taking also  injection of iron in [place], and stool guaiac was negative and blood  peripheral smear was not revealing anything and hemoglobin  electrophoresis was sent.  She will follow it with specialty clinic, and  she was evaluated by the gynecologist Dr. Emelda Fear also while in the  hospital, and he gave her Depo Provera. and he will follow her in 2  months in his office.  The patient has got 2 units of blood, and her  hemoglobin now is 8.4 and she is symptomatic  and she is asking to go  home, and she will be discharged to have followup with primary doctor in  the area.  She will be advised, and she will have follow-up with Dr.  Mariel Sleet in his office, and she will have follow-up with Dr. Emelda Fear,  gynecologist, in brain in 2 months as well.   EXAMINATION:  VITAL SIGNS:  Today her vital signs:  Temperature 98,  pulse 84, respiratory rate is 20 and blood pressure is 150 x 88.  HEENT:  She has slight pallor, nonicteric conjunctivae.  CHEST:  She has  good air entry bilaterally.  CVS:  S1-S2 well heard.  ABDOMEN:  Soft in  area of tenderness.  EXTREMITIES:  No pedal edema.  CNS:  She is alert and well-oriented.   LABORATORY:  Her labs today:  Hemoglobin is 8.4 and hematocrit 27.4 and  cardiac enzymes within normal range.  Three serial cardiac enzymes  within normal range.  Urinalysis is negative for any infection.  Iron  studies:  Iron level is less than 1, and total iron binding capacity is  non-calculatable, and ferritin level is less than 1 and 40 and folic  acid within normal range and vitamin B12 also within normal range MCV is  55, otherwise the chemistry was within normal range during admission,  and D-dimer was a little bit elevated, 0.6, and chest angiogram was done  which was negative for PE or any other infection or pulmonary edema.   DISCHARGE/PLAN:  1. She will be discharged with the above medications and with strong      advise to have a have primary care in the area.  2. Follow up with gynecology Dr. Emelda Fear in 2 months.  Told to have      follow-up with Dr. Mariel Sleet in 2 to 3 days in the specialty clinic      and to have followup there, hemoglobin, electrophoresis as well.      She understood, and she is ready to go home.      Marcello Moores, MD  Electronically Signed     MT/MEDQ  D:  02/07/2007  T:  02/08/2007  Job:  [id number]

## 2010-07-03 NOTE — Discharge Summary (Signed)
[name], [name]                ACCOUNT NO.:  [id number]   MEDICAL RECORD NO.:  [id number]          PATIENT TYPE:  INP   LOCATION:  A202                          FACILITY:  [facility]   PHYSICIAN:  J. Darreld Mclean, M.D. DATE OF BIRTH:  May 18, 1965   DATE OF ADMISSION:  10/06/2008  DATE OF DISCHARGE:  08/20/2010LH                               DISCHARGE SUMMARY   DISCHARGE DIAGNOSIS:  Rotator cuff tear right.   PROCEDURE PERFORMED:  Repair of rotator cuff right __________.   DISCHARGE STATUS:  Improved.   PROGNOSIS:  Good.   DISPOSITION:  Home.   DISCHARGE FOLLOWUP:  He will be seen in my office in 10 days to 2 weeks.  Office visit has been set up.   DISCHARGE MEDICATIONS:  1. Percocet 7.5/325 every 4 hours p.r.n. pain.  2. Resume her labetalol 200 mg b.i.d.  3. Norvasc 10 mg daily.  4. Zoloft 100 mg daily.  5. Lorazepam 1 mg daily.  6. Naprosyn 500 mg b.i.d.  7. Soma 350 mg 3 times as needed.  8. Topamax 50 mg as needed.   BRIEF HISTORY:  The patient underwent the above-mentioned procedure.  She tolerated it well.  Pain has been well-controlled.  Her wound looks  good at time of discharge.  Neurovascularly is intact.  She was given  instructions to have the head of the bed slightly elevated or have  several pillows under her.  She is to use her shoulder immobilizer.  Keep the wound dry.  I will see her in the office as scheduled.  If any  difficulties, contact me through the office hospital beeper system.   She did well.  Her labs stayed stable.                                            ______________________________  J. Darreld Mclean, M.D.    JWK/MEDQ  D:  10/07/2008  T:  10/07/2008  Job:  [id number]

## 2010-07-03 NOTE — Discharge Summary (Signed)
[name], [name]                ACCOUNT NO.:  [id number]   MEDICAL RECORD NO.:  [id number]          PATIENT TYPE:  INP   LOCATION:  A311                          FACILITY:  [facility]   PHYSICIAN:  Lazaro Arms, M.D.   DATE OF BIRTH:  22-Dec-1965   DATE OF ADMISSION:  05/13/2007  DATE OF DISCHARGE:  03/27/2009LH                               DISCHARGE SUMMARY   DISCHARGE DIAGNOSES:  1. Status post abdominal hysterectomy.  2. Unremarkable postoperative course.   PROCEDURES:  Abdominal hysterectomy.   Please refer to the history and physical as well as the operative report  details from the hospital.   HOSPITAL COURSE:  The patient was admitted postoperatively.  Her  intraoperative course was remarkable.  She tolerated clear liquids and  then progressed to regular diet.  Her abdominal exam was benign.  Her  incisions were clean, dry, and intact.  Her vital signs were stable, and  she was afebrile throughout the hospital course.  Her postoperative day  #1 hemoglobin was 11.8, hematocrit 35.3, postop day #2 was 11.3 and 33.8  with normal white count.  She was discharged to home on the morning of  postoperative day #2 in good and stable condition to follow up in the  office next week for evaluation.  She was given instruction precautions  for return prior to that time with Percocet and Motrin for pain.      Lazaro Arms, M.D.  Electronically Signed     LHE/MEDQ  D:  06/10/2007  T:  06/10/2007  Job:  [id number]

## 2010-07-03 NOTE — Op Note (Signed)
[name], [name]                ACCOUNT NO.:  [id number]   MEDICAL RECORD NO.:  [id number]          PATIENT TYPE:  INP   LOCATION:  A311                          FACILITY:  [facility]   PHYSICIAN:  Lazaro Arms, M.D.   DATE OF BIRTH:  1966-01-03   DATE OF PROCEDURE:  05/13/2007  DATE OF DISCHARGE:                               OPERATIVE REPORT   PREOPERATIVE DIAGNOSES:  1. Enlarged fibroid uterus, 14-week size.  2. Menometrorrhagia.  3. Dysmenorrhea.   POSTOPERATIVE DIAGNOSES:  1. Enlarged fibroid uterus, 14-week size.  2. Menometrorrhagia.  3. Dysmenorrhea.   PROCEDURE:  Abdominal hysterectomy.   SURGEON:  Lazaro Arms, M.D.   ANESTHESIA:  General endotracheal anesthesia.   FINDINGS:  The patient had a 14-week size fibroid uterus.  Multiple  fibroids were noted.  Her ovaries were normal.   DESCRIPTION OF OPERATION:  The patient was taken to the operating room,  placed in supine position where she underwent general endotracheal  anesthesia.  The vagina was prepped and a Foley catheter was placed.  The abdomen was then prepped and draped in usual sterile fashion.  A  small Pfannenstiel skin incision was made and carried down sharply to  the rectus fascia which was scored in midline, extended laterally.  The  fascia was taken off the muscles superiorly and inferiorly without  difficulty.  The muscles were divided.  The peritoneal cavity was  entered.  An Alexis self-retaining wound retractor was placed into the  peritoneal cavity and the upper abdomen was packed away.  The uterine  cornu were grasped.  The left round ligament was suture ligated and cut.  The utero-ovarian ligament on the left was isolated, clamped, cut and  double suture ligated.  The right round ligament was suture ligated and  cut.  The utero-ovarian ligament on the right was clamped, cut and  double suture ligated.  The vesicouterine serosal flap was created and  pushed down off the lower uterine  segment.  The uterine vessels were  clamped, cut and suture ligated bilaterally.  The uterus was then  amputated for better visualization.  The cardinal ligament was then  clamped, cut and suture ligated down the length of the cervix without  difficulty.  The cervix was quite long and I then incised the anterior  cervix and anterior vagina and pulled the cervix up and did a vaginal  angle closure and then did closure of the vagina with interrupted figure-  of-eight sutures.  There was good hemostasis.  Pelvis was irrigated  vigorously.  There was a small peritoneal bleeder in the area of the  left infundibulopelvic ligament.  A couple of hemoclips were placed and  a 3-0 Monocryl suture was used.  The patient tolerated the procedure  well.  All packs removed.  The wound retractor was removed.  The muscles  and peritoneum reapproximated loosely.  The fascia closed using 0 Vicryl  running.  Subcutaneous tissues made hemostatic and  irrigated.  Skin was closed using skin staples.  The patient tolerated  the procedure well.  She experienced 100 mL blood  loss, taken to the  recovery room in good and stable condition.  All counts correct x3.  She  received Ancef prophylactically.      Lazaro Arms, M.D.  Electronically Signed     LHE/MEDQ  D:  05/13/2007  T:  05/13/2007  Job:  [id number]
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
## 2010-07-03 NOTE — Op Note (Signed)
[name], [name]                ACCOUNT NO.:  [id number]   MEDICAL RECORD NO.:  [id number]          PATIENT TYPE:  INP   LOCATION:  A202                          FACILITY:  [facility]   PHYSICIAN:  J. Darreld Mclean, M.D. DATE OF BIRTH:  07-10-1965   DATE OF PROCEDURE:  10/06/2008  DATE OF DISCHARGE:                               OPERATIVE REPORT   PREOPERATIVE DIAGNOSIS:  Rotator cuff tear, right.   POSTOPERATIVE DIAGNOSIS:  Rotator cuff tear, right.   PROCEDURE:  Open rotator cuff repair on the right shoulder.   ANESTHESIA:  General.   SURGEON:  J. Darreld Mclean, MD   ESTIMATED BLOOD LOSS:  Minimal, less than 50 mL.   DRAINS:  No drains.   INDICATIONS:  The patient is a 45 year old female with pain and  tenderness in the right shoulder.  MRI shows a tear of the anterior  insertion of the supraspinatus tendon on the right shoulder.  The  patient has continued pain, has not improved with conservative  treatment.  She had a history of pain in the right shoulder for several  years before this tear was identified.  The patient has had therapy in  the past with conservative treatment, has had antiinflammatories, and  continues to have pain.  She desires to have a surgery.  I have gone  over the risks and imponderables of the procedure.  She understands.  She understands need for physical therapy afterwards and it may take  several months to get complete motion back.  I have explained the use of  PCA in the hospital and she will stay overnight.  She appears to  understand this.  She asked appropriate questions.   DESCRIPTION OF PROCEDURE:  The patient was seen in the holding area,  right shoulder was identified as correct surgical site.  It was  introduced to her husband.  I made a mark on her right shoulder.  She  brought to the operating room, where she was placed supine on the  operating room table.  She was given general anesthesia.  She was placed  in the semi-barber type  position.  She was prepped and draped in the  usual manner.   I had a generalized time-out.  The operating room team knew each other,  but reintroduced ourselves.  The patient was identified as [name],  and we were doing the right shoulder, which can be an open repair.  All  instrumentation was deemed to be present, properly working, and  available.  She had received a preop antibiotics.  There were no  students at present.   Incision was made in the usual fashion, draining the end of the acromion  and the coracoacromial ligament.  With careful dissection, the deltoid  ligament was exposed up.  The deltoid was then split in lines with its  fibers in the so-called weak spot area.  A tag was placed 5-cm below the  [facility] joint to avoid any possible injury to the axillary nerve and not make  sure the incision do not extend by passes.  Careful dissection, the  coracoacromial ligament was identified.  It was then excised.  Undersurface of the acromion was basically smooth where I did a modified  Neer acromioplasty and removed some bone.  We used a power rasp to get  good smooth contour.  Attention directed to the end of the shoulder  itself and there was a tear as stated on the MRI, partial rupture  supraspinatus at its nearest insertion.  There was good a tag left on  it.  I was able to bring the small amount of retracted tendon back and  forward.  I elected to do a distal primary repair of the tendon,so-  called modified McLaughlin-type repair with #1 Ethibond suture.  Good  repair was obtained.  It was stable.  With the shoulder through range of  motion, repair was maintained and it was intact.  Shoulder was  reinspected.  No new pathology found.  The deltoid reapproximated using  interrupted vertical mattress sutures.  Subcutaneous tissue was  reapproximated using 2-0 plain.  Skin was reapproximated with running  subcuticular 3-0 nylon.  Steri-Strips were applied.  Sterile dressing   was applied.  Cryo cuff was applied.  The patient tolerated the  procedure well.  She went to recovery in good condition.  She was  admitted overnight for pain control.           ______________________________  J. Darreld Mclean, M.D.     JWK/MEDQ  D:  10/06/2008  T:  10/06/2008  Job:  [id number]

## 2010-07-03 NOTE — H&P (Signed)
[name], [name]                ACCOUNT NO.:  [id number]   MEDICAL RECORD NO.:  [id number]          PATIENT TYPE:  INP   LOCATION:  A303                          FACILITY:  [facility]   PHYSICIAN:  Marcello Moores, MD   DATE OF BIRTH:  Aug 06, 1965   DATE OF ADMISSION:  02/05/2007  DATE OF DISCHARGE:  LH                              HISTORY & PHYSICAL   PMD:  Unassigned.  Recently moved from [place] to this area 2 months,  and she stated that she used to have a PMD as well as pain management in  [place].   CHIEF COMPLAINT:  Generalized weakness, shortness of breath of 2 weeks'  duration and associated with the heavy periods.   HISTORY OF PRESENT ILLNESS:  [name] is a 45 year old female patient  with a history of heavy periods, chronic anemia, degenerative joint  disease with chronic back pain, and a history of hypertension, who came  today to emergency room with complaints of shortness of breath,  generalized weakness, and was found to have low hemoglobin and called  for admission.  When I interviewed her, the patient stated that she used  to have chronic anemia and she got multiple transfusions before but they  did not tell her why she was anemic but she used to take also iron  injections, but she denied that anybody told her about sickle cell  disease or any history of thalassemia.  The patient stated that she used  to have high blood pressure for which she was taking labetalol and  Norvasc, but she was off medications for the last 6 months because her  blood pressure was low and remained very low without any medication and  her physician stopped to her medications for high blood pressure and it  remains normal as per the patient, but she used to take pain  medications, hydrocodone for her back pain and degenerative joint  disease.  She stated that she has very heavy periods, which stays for  around 10 days and sometimes she has periods every 2 weeks as per the  patient.  She  admitted that she had generalized arthralgia but no fever,  no chest pain specifically, no cough, no urinary complaints, no  abdominal complaints.   REVIEW OF SYSTEMS:  A 10-point review of systems is as detailed in the  HPI, otherwise noncontributory.   ALLERGIES:  No known drug allergies.   SOCIAL HISTORY:  She smokes a half-pack per day for several years and  she is a social drinker and denied any drug abuse.  She moved 2 months  to [place] from [place], and moved for good to here.   FAMILY HISTORY:  Her niece has sickle cell disease.   PAST MEDICAL HISTORY:  1. Degenerative joint disease.  2. History of hypertension, off medications.  3. Chronic anemia of unclear origin.  4. Heavy periods/menorrhagia.   HOME MEDICATIONS:  1. Hydrocodone 10/650 mg p.o. every 6 hours p.r.n. for pain.  2. Iron injections p.r.n.  3. Labetalol for hypertension, off the medication for the last 6  months.  4. Norvasc for hypertension, off the medication for the last 6 months.   PHYSICAL EXAMINATION:  The patient is lying in the bed without any  cardiopulmonary distress, saturating well on room air.  VITAL SIGNS:  Temperature 97, pulse is 90, respiratory rate 20, blood  pressure 157/94 and saturation 100% on room air.  HEENT:  She has slight pallor.  Nonicteric sclerae.  NECK:  Supple.  CHEST:  She has good air entry bilaterally.  CVS:  S1-S2 well-heard, nonfunctional murmur.  ABDOMEN:  Soft, no tenderness.  Normoactive bowel sounds.  EXTREMITIES:  No pedal or pretibial edema.  CNS:  She is alert and well-oriented.   LABS:  White blood cells 4.9, hemoglobin is 6.1, hematocrit 21.4,  platelet count is 873, and MCV is 55.  On the chemistry, sodium is 138,  potassium is 3.4, chloride is 107, CO2 is 22, glucose is 101, BUN 14,  creatinine is 0.9.  CK-MB is 1.4 and troponin is 0.7.  Pregnancy test is  negative.  Urinalysis is negative.  Stool guaiac once in the emergency  room is also  negative.  D-dimer is 0.66.   ASSESSMENT:  1. Symptomatic anemia.  The patient came with shortness of breath and      generalized weakness and she gave history of heavy periods even      though she could not quantify it, but she stated that she had      periods for more than 7 days and she just finished her last, so      this might be contributing to her symptomatic anemia.  Will admit      her and will do some iron studies and will do also serum      electrophoresis and peripheral blood smear and will transfuse her      with 2 units of blood.  We will consult a gynecologist also to      evaluate her.  2. Menorrhagia probably causing problem #1.  Will do ultrasound of the      reproductive organs to see if there is any myoma or other cause of      bleeding and will consult also a gynecologist to evaluate her.      Other additional management will be as per #1.  3. Chronic anemia which is microcytic with MCV 55, and it was chronic      and she was getting iron injections even though we are not sure      currently we what was the cause.  We will try to rule out      possibility of thalassemia as well as sickle cell disease and if      there is any possibility, we will consult also for further      evaluation a hematologist.  Will continue to send three stool for      Hemoccults to make sure there is not any gastrointestinal bleed.  4. Hypertension.  This patient states that she had a history of      hypertension and she was on medications even though she was off      medications for the last 6 months, which was normal with normal      blood pressure.  Today the blood pressure is on the upper normal      and will monitor.  It if she continues to have high blood pressure,      we will start hypertensive medications.  5. Slightly elevated D-dimer.  The patient came with shortness of      breath.  Even though that is probably part of the symptomatic      anemia, with this elevated D-dimer  will do chest angiography also      to rule out any possibility      of pulmonary embolus and will do echocardiogram and we will monitor      her cardiac enzymes as it was started.  Will put her on ferrous      sulfate p.o. also on top of the transfusion.  Will put her on DVT      and GI prophylaxis as well.      Marcello Moores, MD  Electronically Signed     MT/MEDQ  D:  02/05/2007  T:  02/06/2007  Job:  [id number]

## 2010-07-03 NOTE — Op Note (Signed)
[name], [name]                ACCOUNT NO.:  [id number]   MEDICAL RECORD NO.:  [id number]          PATIENT TYPE:  AMB   LOCATION:  DSC                          FACILITY:  [facility]   PHYSICIAN:  Karol T. Lazarus Salines, M.D. DATE OF BIRTH:  01-31-1966   DATE OF PROCEDURE:  04/11/2008  DATE OF DISCHARGE:                               OPERATIVE REPORT   PREOPERATIVE DIAGNOSIS:  Closed displaced nasal fracture.   POSTOPERATIVE DIAGNOSIS:  Closed displaced nasal fracture.   PROCEDURE PERFORMED:  Closed reduction nasal fracture with  stabilization.   SURGEON:  Gloris Manchester. Lazarus Salines, MD   ANESTHESIA:  General LMA.   BLOOD LOSS:  None.   COMPLICATIONS:  None.   FINDINGS:  Relatively petite nose overall,  high leftward septal  deviation.  Depression of the right nasal bone with ready mobilization.   PROCEDURE:  With the patient having received preoperative Afrin spray,  in a comfortable supine position, general LMA anesthesia was  administered.  The patient was placed in a slight sitting position.  The  nose was inspected and palpated with the findings as described both  externally and internally.  The bone fracture elevator was measured at  the level of the medial canthus, placed under the right nose and with  anterior and right lateral pressure, the displaced nasal bone was  remobilized into a more elevated position.  The  left side was mobilized  slightly for a good configuration of the dorsum, which was mobile but  stable.  A small amount of blood was suctioned from the internal nose.   The nose was cleaned with alcohol and painted with skin prep, then Steri-  Strips were applied in the standard fashion, and finally a petite [place]  splint was applied.  The internal nose was inspected once again,  hemostasis was observed.  As this point, the procedure was completed.  The patient was returned to Anesthesia, awakened, extubated, and  transferred to recovery in stable condition.   COMMENT:   A 45 year old black female stumbled approximately 2 weeks ago  striking her nose and sustaining an -x-ray proven depressed nasal  fracture, hence the indication for today's procedure.  Anticipated  routine postoperative recovery with attention to  ice, elevation, and  analgesia.  We will remove the septal splint in 10 days.  Given low  anticipated risk of postanesthetic or postsurgical complications, I feel  an  outpatient venue was appropriate.      Gloris Manchester. Lazarus Salines, M.D.  Electronically Signed     KTW/MEDQ  D:  04/11/2008  T:  04/12/2008  Job:  [id number]   cc:   Franchot Heidelberg, M.D.
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
## 2010-11-08 LAB — CBC
HCT: 33.1 — ABNORMAL LOW
Hemoglobin: 10.6 — ABNORMAL LOW
MCHC: 32
MCV: 69 — ABNORMAL LOW
Platelets: 378
RBC: 4.79
RDW: 32.6 — ABNORMAL HIGH
WBC: 5.1

## 2010-11-08 LAB — FERRITIN: Ferritin: 6 — ABNORMAL LOW (ref 10–291)

## 2010-11-09 LAB — CBC
HCT: 38.9
Hemoglobin: 12.7
MCHC: 32.6
MCV: 76.7 — ABNORMAL LOW
Platelets: 385
RBC: 5.07
RDW: 32.6 — ABNORMAL HIGH
WBC: 5.4

## 2010-11-09 LAB — FERRITIN: Ferritin: 210 (ref 10–291)

## 2010-11-12 LAB — TYPE AND SCREEN
ABO/RH(D): O POS
Antibody Screen: POSITIVE
DAT, IgG: POSITIVE
Donor AG Type: NEGATIVE
Donor AG Type: NEGATIVE
PT AG Type: NEGATIVE

## 2010-11-12 LAB — DIFFERENTIAL
Basophils Absolute: 0
Basophils Absolute: 0
Basophils Relative: 0
Basophils Relative: 0
Eosinophils Absolute: 0.1
Eosinophils Absolute: 0.1
Eosinophils Relative: 1
Eosinophils Relative: 2
Lymphocytes Relative: 25
Lymphocytes Relative: 29
Lymphs Abs: 1.9
Lymphs Abs: 1.9
Monocytes Absolute: 0.5
Monocytes Absolute: 0.5
Monocytes Relative: 7
Monocytes Relative: 7
Neutro Abs: 3.9
Neutro Abs: 5
Neutrophils Relative %: 61
Neutrophils Relative %: 67

## 2010-11-12 LAB — CBC
HCT: 33.8 — ABNORMAL LOW
HCT: 35.3 — ABNORMAL LOW
HCT: 38.1
Hemoglobin: 11.3 — ABNORMAL LOW
Hemoglobin: 11.8 — ABNORMAL LOW
Hemoglobin: 12.7
MCHC: 33.3
MCHC: 33.4
MCHC: 33.4
MCV: 80.4
MCV: 80.7
MCV: 81.1
Platelets: 310
Platelets: 324
Platelets: 337
RBC: 4.19
RBC: 4.35
RBC: 4.74
RDW: 24.9 — ABNORMAL HIGH
RDW: 25.8 — ABNORMAL HIGH
RDW: 26.6 — ABNORMAL HIGH
WBC: 4.1
WBC: 6.5
WBC: 7.4

## 2010-11-12 LAB — COMPREHENSIVE METABOLIC PANEL
ALT: 24
AST: 20
Albumin: 3.9
Alkaline Phosphatase: 65
BUN: 12
CO2: 25
Calcium: 9.2
Chloride: 108
Creatinine, Ser: 0.87
GFR calc Af Amer: >60
GFR calc non Af Amer: >60
Glucose, Bld: 105 — ABNORMAL HIGH
Potassium: 4.1
Sodium: 138
Total Bilirubin: 0.5
Total Protein: 6.4

## 2010-11-12 LAB — URINALYSIS, ROUTINE W REFLEX MICROSCOPIC
Bilirubin Urine: NEGATIVE
Glucose, UA: NEGATIVE
Ketones, ur: NEGATIVE
Leukocytes, UA: NEGATIVE
Nitrite: NEGATIVE
Protein, ur: NEGATIVE
Specific Gravity, Urine: >1.03 — ABNORMAL HIGH
Urobilinogen, UA: 0.2
pH: 5.5

## 2010-11-12 LAB — URINE MICROSCOPIC-ADD ON

## 2010-11-12 LAB — HCG, QUANTITATIVE, PREGNANCY: hCG, Beta Chain, Quant, S: <2

## 2010-11-13 LAB — DIFFERENTIAL
Basophils Absolute: 0
Basophils Relative: 1
Eosinophils Absolute: 0.1
Eosinophils Relative: 2
Lymphocytes Relative: 23
Lymphs Abs: 1.2
Monocytes Absolute: 0.3
Monocytes Relative: 7
Neutro Abs: 3.6
Neutrophils Relative %: 68

## 2010-11-13 LAB — BASIC METABOLIC PANEL
BUN: 11
CO2: 25
Calcium: 9.4
Chloride: 105
Creatinine, Ser: 0.81
GFR calc Af Amer: >60
GFR calc non Af Amer: >60
Glucose, Bld: 123 — ABNORMAL HIGH
Potassium: 4.1
Sodium: 139

## 2010-11-13 LAB — CBC
HCT: 36
HCT: 35.4 — ABNORMAL LOW
Hemoglobin: 12.2
Hemoglobin: 11.8 — ABNORMAL LOW
MCHC: 34
MCHC: 33.4
MCV: 79.4
MCV: 81.5
Platelets: 457 — ABNORMAL HIGH
Platelets: 419 — ABNORMAL HIGH
RBC: 4.53
RBC: 4.34
RDW: 24.9 — ABNORMAL HIGH
RDW: 23.9 — ABNORMAL HIGH
WBC: 4.8
WBC: 5.3

## 2010-11-13 LAB — FERRITIN: Ferritin: 144 (ref 10–291)

## 2010-11-23 LAB — HEMOGLOBINOPATHY EVALUATION
Hemoglobin Other: 0 (ref 0.0–0.0)
Hgb A2 Quant: 1.2 — ABNORMAL LOW
Hgb A: 98.8 % — ABNORMAL HIGH
Hgb F Quant: 0 (ref 0.0–2.0)
Hgb S Quant: 0 % (ref 0.0–0.0)

## 2010-11-23 LAB — BASIC METABOLIC PANEL
BUN: 14
CO2: 22
Calcium: 9.3
Chloride: 107
Creatinine, Ser: 0.91
GFR calc Af Amer: >60
GFR calc non Af Amer: >60
Glucose, Bld: 101 — ABNORMAL HIGH
Potassium: 3.4 — ABNORMAL LOW
Sodium: 138

## 2010-11-23 LAB — URINE MICROSCOPIC-ADD ON

## 2010-11-23 LAB — VITAMIN B12: Vitamin B-12: 635 (ref 211–911)

## 2010-11-23 LAB — CK TOTAL AND CKMB (NOT AT ARMC)
CK, MB: 1.4
Relative Index: 0.7
Total CK: 189 — ABNORMAL HIGH

## 2010-11-23 LAB — HEMOGLOBIN AND HEMATOCRIT, BLOOD
HCT: 26.4 — ABNORMAL LOW
HCT: 26.5 — ABNORMAL LOW
HCT: 27.4 — ABNORMAL LOW
HCT: 28.2 — ABNORMAL LOW
HCT: 28.9 — ABNORMAL LOW
Hemoglobin: 8.1 — ABNORMAL LOW
Hemoglobin: 8.1 — ABNORMAL LOW
Hemoglobin: 8.4 — ABNORMAL LOW
Hemoglobin: 8.5 — ABNORMAL LOW
Hemoglobin: 8.5 — ABNORMAL LOW

## 2010-11-23 LAB — DIFFERENTIAL
Band Neutrophils: 0
Basophils Relative: 0
Blasts: 0
Eosinophils Relative: 0
Lymphocytes Relative: 40
Metamyelocytes Relative: 0
Monocytes Relative: 5
Myelocytes: 0
Neutrophils Relative %: 55
Promyelocytes Absolute: 0
Smear Review: INCREASED
nRBC: 0

## 2010-11-23 LAB — IRON AND TIBC
Iron: <10 — ABNORMAL LOW
UIBC: 549

## 2010-11-23 LAB — URINALYSIS, ROUTINE W REFLEX MICROSCOPIC
Bilirubin Urine: NEGATIVE
Glucose, UA: NEGATIVE
Ketones, ur: 15 — AB
Leukocytes, UA: NEGATIVE
Nitrite: NEGATIVE
Protein, ur: NEGATIVE
Specific Gravity, Urine: 1.02
Urobilinogen, UA: 2 — ABNORMAL HIGH
pH: 6

## 2010-11-23 LAB — TROPONIN I: Troponin I: 0.05

## 2010-11-23 LAB — CBC
HCT: 21.4 — ABNORMAL LOW
Hemoglobin: 6.1 — CL
MCHC: 28.5 — ABNORMAL LOW
MCV: 55 — ABNORMAL LOW
Platelets: 873 — ABNORMAL HIGH
RBC: 3.88
RDW: 23.6 — ABNORMAL HIGH
WBC: 4.9

## 2010-11-23 LAB — RETICULOCYTES
RBC.: 3.91
Retic Count, Absolute: 121.2
Retic Ct Pct: 3.1

## 2010-11-23 LAB — CARDIAC PANEL(CRET KIN+CKTOT+MB+TROPI)
CK, MB: 1.4
CK, MB: 1.6
Relative Index: 0.8
Relative Index: 0.8
Total CK: 179 — ABNORMAL HIGH
Total CK: 190 — ABNORMAL HIGH
Troponin I: 0.04
Troponin I: 0.05

## 2010-11-23 LAB — SEDIMENTATION RATE: Sed Rate: 5

## 2010-11-23 LAB — PREGNANCY, URINE: Preg Test, Ur: NEGATIVE

## 2010-11-23 LAB — CROSSMATCH
ABO/RH(D): O POS
Antibody Screen: NEGATIVE

## 2010-11-23 LAB — FERRITIN: Ferritin: <1 — ABNORMAL LOW (ref 10–291)

## 2010-11-23 LAB — B-NATRIURETIC PEPTIDE (CONVERTED LAB): Pro B Natriuretic peptide (BNP): <30

## 2010-11-23 LAB — OCCULT BLOOD X 1 CARD TO LAB, STOOL: Fecal Occult Bld: NEGATIVE

## 2010-11-23 LAB — FOLATE: Folate: 10.9

## 2010-11-23 LAB — D-DIMER, QUANTITATIVE: D-Dimer, Quant: 0.66 — ABNORMAL HIGH

## 2010-11-23 LAB — ABO/RH: ABO/RH(D): O POS

## 2011-05-24 ENCOUNTER — Emergency Department (HOSPITAL_COMMUNITY): Payer: Medicare HMO

## 2011-05-24 ENCOUNTER — Emergency Department (HOSPITAL_COMMUNITY)
Admission: EM | Admit: 2011-05-24 | Discharge: 2011-05-24 | Disposition: A | Discharge status: Home / Self Care | Payer: Medicare HMO | Attending: Emergency Medicine | Admitting: Emergency Medicine

## 2011-05-24 ENCOUNTER — Other Ambulatory Visit: Payer: Self-pay

## 2011-05-24 ENCOUNTER — Encounter (HOSPITAL_COMMUNITY): Payer: Self-pay | Admitting: Emergency Medicine

## 2011-05-24 DIAGNOSIS — J40 Bronchitis, not specified as acute or chronic: Secondary | ICD-10-CM | POA: Insufficient documentation

## 2011-05-24 DIAGNOSIS — IMO0001 Reserved for inherently not codable concepts without codable children: Secondary | ICD-10-CM | POA: Insufficient documentation

## 2011-05-24 DIAGNOSIS — R490 Dysphonia: Secondary | ICD-10-CM | POA: Insufficient documentation

## 2011-05-24 DIAGNOSIS — I10 Essential (primary) hypertension: Secondary | ICD-10-CM | POA: Insufficient documentation

## 2011-05-24 DIAGNOSIS — R0602 Shortness of breath: Secondary | ICD-10-CM | POA: Insufficient documentation

## 2011-05-24 DIAGNOSIS — R51 Headache: Secondary | ICD-10-CM | POA: Insufficient documentation

## 2011-05-24 DIAGNOSIS — J3489 Other specified disorders of nose and nasal sinuses: Secondary | ICD-10-CM | POA: Insufficient documentation

## 2011-05-24 DIAGNOSIS — R112 Nausea with vomiting, unspecified: Secondary | ICD-10-CM | POA: Insufficient documentation

## 2011-05-24 DIAGNOSIS — R630 Anorexia: Secondary | ICD-10-CM | POA: Insufficient documentation

## 2011-05-24 DIAGNOSIS — R197 Diarrhea, unspecified: Secondary | ICD-10-CM | POA: Insufficient documentation

## 2011-05-24 DIAGNOSIS — R079 Chest pain, unspecified: Secondary | ICD-10-CM | POA: Insufficient documentation

## 2011-05-24 DIAGNOSIS — R062 Wheezing: Secondary | ICD-10-CM | POA: Insufficient documentation

## 2011-05-24 DIAGNOSIS — J329 Chronic sinusitis, unspecified: Secondary | ICD-10-CM | POA: Insufficient documentation

## 2011-05-24 DIAGNOSIS — R05 Cough: Secondary | ICD-10-CM | POA: Insufficient documentation

## 2011-05-24 DIAGNOSIS — R059 Cough, unspecified: Secondary | ICD-10-CM | POA: Insufficient documentation

## 2011-05-24 DIAGNOSIS — R070 Pain in throat: Secondary | ICD-10-CM | POA: Insufficient documentation

## 2011-05-24 LAB — BASIC METABOLIC PANEL
BUN: 12 mg/dL (ref 6–23)
CO2: 25 mEq/L (ref 19–32)
Calcium: 9.5 mg/dL (ref 8.4–10.5)
Chloride: 103 mEq/L (ref 96–112)
Creatinine, Ser: 0.78 mg/dL (ref 0.50–1.10)
GFR calc Af Amer: >90 mL/min (ref >90)
GFR calc non Af Amer: >90 mL/min (ref >90)
Glucose, Bld: 165 mg/dL — ABNORMAL HIGH (ref 70–99)
Potassium: 3.6 mEq/L (ref 3.5–5.1)
Sodium: 138 mEq/L (ref 135–145)

## 2011-05-24 LAB — CBC
HCT: 42.7 % (ref 36.0–46.0)
Hemoglobin: 14.2 g/dL (ref 12.0–15.0)
MCH: 27.3 pg (ref 26.0–34.0)
MCHC: 33.3 g/dL (ref 30.0–36.0)
MCV: 82.1 fL (ref 78.0–100.0)
Platelets: 382 10*3/uL (ref 150–400)
RBC: 5.2 MIL/uL — ABNORMAL HIGH (ref 3.87–5.11)
RDW: 15.2 % (ref 11.5–15.5)
WBC: 5.3 10*3/uL (ref 4.0–10.5)

## 2011-05-24 LAB — DIFFERENTIAL
Basophils Absolute: 0 10*3/uL (ref 0.0–0.1)
Basophils Relative: 0 % (ref 0–1)
Eosinophils Absolute: 0.1 10*3/uL (ref 0.0–0.7)
Eosinophils Relative: 2 % (ref 0–5)
Lymphocytes Relative: 47 % — ABNORMAL HIGH (ref 12–46)
Lymphs Abs: 2.5 10*3/uL (ref 0.7–4.0)
Monocytes Absolute: 0.5 10*3/uL (ref 0.1–1.0)
Monocytes Relative: 9 % (ref 3–12)
Neutro Abs: 2.2 10*3/uL (ref 1.7–7.7)
Neutrophils Relative %: 42 % — ABNORMAL LOW (ref 43–77)

## 2011-05-24 LAB — POCT I-STAT TROPONIN I: Troponin i, poc: 0 ng/mL (ref 0.00–0.08)

## 2011-05-24 MED ORDER — HYDROCOD POLST-CHLORPHEN POLST 10-8 MG/5ML PO LQCR: 5.0000 mL | Freq: Two times a day (BID) | ORAL | Status: DC | PRN

## 2011-05-24 MED ORDER — IPRATROPIUM BROMIDE 0.02 % IN SOLN
0.5000 mg | Freq: Once | RESPIRATORY_TRACT | Status: AC
  Administered 2011-05-24: 0.5 mg via RESPIRATORY_TRACT
  Filled 2011-05-24: qty 2.5

## 2011-05-24 MED ORDER — AMOXICILLIN 500 MG PO CAPS: 1000.0000 mg | ORAL_CAPSULE | Freq: Two times a day (BID) | ORAL | Status: AC

## 2011-05-24 MED ORDER — ALBUTEROL SULFATE HFA 108 (90 BASE) MCG/ACT IN AERS
2.0000 | INHALATION_SPRAY | RESPIRATORY_TRACT | Status: DC | PRN
  Administered 2011-05-24: 2 via RESPIRATORY_TRACT
  Filled 2011-05-24: qty 6.7

## 2011-05-24 MED ORDER — PREDNISONE 10 MG PO TABS: 20.0000 mg | ORAL_TABLET | Freq: Every day | ORAL | Status: DC

## 2011-05-24 MED ORDER — METHYLPREDNISOLONE SODIUM SUCC 125 MG IJ SOLR
125.0000 mg | Freq: Once | INTRAMUSCULAR | Status: AC
  Administered 2011-05-24: 125 mg via INTRAVENOUS
  Filled 2011-05-24: qty 2

## 2011-05-24 MED ORDER — ALBUTEROL SULFATE (5 MG/ML) 0.5% IN NEBU
2.5000 mg | INHALATION_SOLUTION | Freq: Once | RESPIRATORY_TRACT | Status: AC
  Administered 2011-05-24: 2.5 mg via RESPIRATORY_TRACT
  Filled 2011-05-24: qty 0.5

## 2011-05-24 MED ORDER — DEXTROSE 5 % IV SOLN
1.0000 g | Freq: Once | INTRAVENOUS | Status: AC
  Administered 2011-05-24: 1 g via INTRAVENOUS
  Filled 2011-05-24: qty 10

## 2011-05-24 MED ORDER — KETOROLAC TROMETHAMINE 30 MG/ML IJ SOLN
30.0000 mg | Freq: Once | INTRAMUSCULAR | Status: AC
  Administered 2011-05-24: 30 mg via INTRAVENOUS
  Filled 2011-05-24: qty 1

## 2011-05-24 MED ORDER — ONDANSETRON HCL 4 MG/2ML IJ SOLN
4.0000 mg | Freq: Once | INTRAMUSCULAR | Status: AC
  Administered 2011-05-24: 4 mg via INTRAVENOUS
  Filled 2011-05-24: qty 2

## 2011-05-24 MED ORDER — SODIUM CHLORIDE 0.9 % IV SOLN
Freq: Once | INTRAVENOUS | Status: AC
  Administered 2011-05-24: 19:00:00 via INTRAVENOUS

## 2011-05-24 NOTE — ED Notes (Signed)
Pt c/o chest pain/congestion today. Pt states she was seen by her pcp x 2 days ago and is currently on antibiotics for ear infection.

## 2011-05-24 NOTE — ED Provider Notes (Signed)
History   This chart was scribed for Debra Booze, MD by Charolett Bumpers . The patient was seen in room APA04/[facility]04 and the patient's care was started at 6:35pm.   CSN: [id number]  Arrival date & time 05/24/11  1819   First MD Initiated Contact with Patient 05/24/11 1834      Chief Complaint  Patient presents with  . Chest Pain  . Shortness of Breath    (Consider location/radiation/quality/duration/timing/severity/associated sxs/prior treatment) HPI [name] is a 46 y.o. female who presents to the Emergency Department complaining of constant, moderate chest pain with associated productive cough, nasal congestion, sore throat, n/v/d, decreased appetite, SOB, hoarseness, and body aches for the past two weeks. Patient states that she started feeling sick two weeks ago with ear pain. Patient notes that 2 days ago, she saw her PCP and placed on antibiotics for an ear infection. Patient starts that her productive cough produces brown/yellow phlegm. Patient states nasal drainage is green. Patient rates body aches as an 8.5/10. Patient describes chest pain as throbbing and is made worse with movement and deep breaths. Patient states that her symptoms are made better with rest and laying down. Patient states that she has taking Benadryl and Robitussin with minimal relief. Patient admits to smoking.  PCP: DonDiego.    Past Medical History  Diagnosis Date  . Hypertension     Past Surgical History  Procedure Date  . Arm surgery     No family history on file.  History  Substance Use Topics  . Smoking status: Not on file  . Smokeless tobacco: Not on file  . Alcohol Use:     OB History    Grav Para Term Preterm Abortions TAB SAB Ect Mult Living                  Review of Systems A complete 10 system review of systems was obtained and all systems are negative except as noted in the HPI and PMH.   Allergies  Review of patient's allergies indicates no known  allergies.  Home Medications  No current outpatient prescriptions on file.  BP 184/120  Pulse 86  Temp 98.2 F (36.8 C)  Resp 20  Ht 5' 4.5" (1.638 m)  Wt 245 lb (111.131 kg)  BMI 41.40 kg/m2  SpO2 97%  Physical Exam  Nursing note and vitals reviewed. Constitutional: She is oriented to person, place, and time. She appears well-developed and well-nourished. No distress.  HENT:  Head: Normocephalic and atraumatic.  Right Ear: External ear normal.  Left Ear: External ear normal.  Nose: Nose normal.       Moderate frontal and maxillary tenderness. Moderate pharyngeal erythema. Voice hoarse. Anterior and posterior cervical lymph node tenderness.   Eyes: EOM are normal. Pupils are equal, round, and reactive to light.  Neck: Normal range of motion. Neck supple. No tracheal deviation present.  Cardiovascular: Normal rate, regular rhythm and normal heart sounds.  Exam reveals no gallop and no friction rub.   No murmur heard. Pulmonary/Chest: Effort normal. No respiratory distress. She has wheezes (Diffuse inspiratory and expiratory wheezing). She exhibits tenderness (Moderate ).  Abdominal: Soft. She exhibits no distension. Bowel sounds are decreased. There is no tenderness.  Musculoskeletal: Normal range of motion. She exhibits no edema.  Neurological: She is alert and oriented to person, place, and time. No sensory deficit.  Skin: Skin is warm and dry.  Psychiatric: She has a normal mood and affect. Her behavior is normal.  ED Course  Procedures (including critical care time)  DIAGNOSTIC STUDIES: Oxygen Saturation is 97% on room air, normal by my interpretation.    COORDINATION OF CARE:  1845: Discussed planned course of treatment with the patient, who is agreeable at this time. Will order labs and chest x-ray. 1845: Medication Orders: Ketorolac 30 mg/mL injection 30 mg-once; Ondansetron injection 4 mg-once; Ipratropium nebulizer solution 0.5 mg-once; Albuterol 0.5% nebulizer  solution 2.5 mg-once; 0.9% sodium chloride infusion-once. 1915: Medication Orders: Methylprednisolone sodium succinate 125 mg/2 mL injection 125 mg-once.  Results for orders placed during the hospital encounter of 05/24/11  CBC      Component Value Range   WBC 5.3  4.0 - 10.5 (K/uL)   RBC 5.20 (*) 3.87 - 5.11 (MIL/uL)   Hemoglobin 14.2  12.0 - 15.0 (g/dL)   HCT 40.9  81.1 - 91.4 (%)   MCV 82.1  78.0 - 100.0 (fL)   MCH 27.3  26.0 - 34.0 (pg)   MCHC 33.3  30.0 - 36.0 (g/dL)   RDW 78.2  95.6 - 21.3 (%)   Platelets 382  150 - 400 (K/uL)  DIFFERENTIAL      Component Value Range   Neutrophils Relative 42 (*) 43 - 77 (%)   Neutro Abs 2.2  1.7 - 7.7 (K/uL)   Lymphocytes Relative 47 (*) 12 - 46 (%)   Lymphs Abs 2.5  0.7 - 4.0 (K/uL)   Monocytes Relative 9  3 - 12 (%)   Monocytes Absolute 0.5  0.1 - 1.0 (K/uL)   Eosinophils Relative 2  0 - 5 (%)   Eosinophils Absolute 0.1  0.0 - 0.7 (K/uL)   Basophils Relative 0  0 - 1 (%)   Basophils Absolute 0.0  0.0 - 0.1 (K/uL)  BASIC METABOLIC PANEL      Component Value Range   Sodium 138  135 - 145 (mEq/L)   Potassium 3.6  3.5 - 5.1 (mEq/L)   Chloride 103  96 - 112 (mEq/L)   CO2 25  19 - 32 (mEq/L)   Glucose, Bld 165 (*) 70 - 99 (mg/dL)   BUN 12  6 - 23 (mg/dL)   Creatinine, Ser 0.86  0.50 - 1.10 (mg/dL)   Calcium 9.5  8.4 - 57.8 (mg/dL)   GFR calc non Af Amer >90  >90 (mL/min)   GFR calc Af Amer >90  >90 (mL/min)  POCT I-STAT TROPONIN I      Component Value Range   Troponin i, poc 0.00  0.00 - 0.08 (ng/mL)   Comment 3              Dg Chest 2 View  05/24/2011  *RADIOLOGY REPORT*  Clinical Data: Cough.  Congestion.  Hoarseness.  CHEST - 2 VIEW  Comparison: 06/06/2010  Findings: Airway thickening may reflect bronchitis or reactive airways disease.  No airspace opacity is identified to suggest bacterial pneumonia pattern.  Cardiac and mediastinal contours appear unremarkable.  No pleural effusion identified.  IMPRESSION:  1. Airway thickening  may reflect bronchitis or reactive airways disease.  No airspace opacity is identified to suggest bacterial pneumonia pattern.  Original Report Authenticated By: Dellia Cloud, M.D.    Date: 05/24/2011  Rate: 88  Rhythm: normal sinus rhythm  QRS Axis: left  Intervals: normal  ST/T Wave abnormalities: normal  Conduction Disutrbances:none  Narrative Interpretation: Left axis deviation, probable left atrial enlargement, old anteroseptal myocardial infarction. When compared with ECG of 06/06/2010, no significant changes are seen.  Old EKG  Reviewed: unchanged  After an albuterol and Atrovent nebulizer treatment, there is marked improvement in her lung exam but still residual wheezing. She's given another nebulizer treatments. She will be given an initial dose of Rocephin in the ED.  2100: She feels much better after a second albuterol nebulizer treatment. However, on exam, there is still moderate wheezes. She will be given a third albuterol nebulizer treatment and discharged. She is anxious to go home because her son is getting married tomorrow. She is given an albuterol and inhaler to take home and given prescriptions for Tussionex, amoxicillin, and prednisone.  1. Bronchitis   2. Sinusitis       MDM  Respiratory tract infection with components of sinusitis and bronchitis and possible pneumonia. She has considerable bronchospasm and be treated with steroids and nebulized albuterol and Atrovent. Chest x-ray is being obtained to rule out pneumonia.   I personally performed the services described in this documentation, which was scribed in my presence. The recorded information has been reviewed and considered.         Debra Booze, MD 05/24/11 2200

## 2011-05-24 NOTE — Discharge Instructions (Signed)
Sinusitis  Sinuses are air pockets within the bones of your face. The growth of bacteria within a sinus leads to infection. The infection prevents the sinuses from draining. This infection is called sinusitis.  SYMPTOMS   There will be different areas of pain depending on which sinuses have become infected.   The maxillary sinuses often produce pain beneath the eyes.   Frontal sinusitis may cause pain in the middle of the forehead and above the eyes.  Other problems (symptoms) include:   Toothaches.   Colored, pus-like (purulent) drainage from the nose.   Swelling, warmth, and tenderness over the sinus areas may be signs of infection.  TREATMENT   Sinusitis is most often determined by an exam.X-rays may be taken. If x-rays have been taken, make sure you obtain your results or find out how you are to obtain them. Your caregiver may give you medications (antibiotics). These are medications that will help kill the bacteria causing the infection. You may also be given a medication (decongestant) that helps to reduce sinus swelling.   HOME CARE INSTRUCTIONS    Only take over-the-counter or prescription medicines for pain, discomfort, or fever as directed by your caregiver.   Drink extra fluids. Fluids help thin the mucus so your sinuses can drain more easily.   Applying either moist heat or ice packs to the sinus areas may help relieve discomfort.   Use saline nasal sprays to help moisten your sinuses. The sprays can be found at your local drugstore.  SEEK IMMEDIATE MEDICAL CARE IF:   You have a fever.   You have increasing pain, severe headaches, or toothache.   You have nausea, vomiting, or drowsiness.   You develop unusual swelling around the face or trouble seeing.  MAKE SURE YOU:    Understand these instructions.   Will watch your condition.   Will get help right away if you are not doing well or get worse.  Document Released: 02/04/2005 Document Revised: 01/24/2011 Document Reviewed:  09/03/2006  [employer] Patient Information 2012 [employer]    Bronchitis  Bronchitis is the body's way of reacting to injury and/or infection (inflammation) of the bronchi. Bronchi are the air tubes that extend from the windpipe into the lungs. If the inflammation becomes severe, it may cause shortness of breath.  CAUSES   Inflammation may be caused by:   A virus.   Germs (bacteria).   Dust.   Allergens.   Pollutants and many other irritants.  The cells lining the bronchial tree are covered with tiny hairs (cilia). These constantly beat upward, away from the lungs, toward the mouth. This keeps the lungs free of pollutants. When these cells become too irritated and are unable to do their job, mucus begins to develop. This causes the characteristic cough of bronchitis. The cough clears the lungs when the cilia are unable to do their job. Without either of these protective mechanisms, the mucus would settle in the lungs. Then you would develop pneumonia.  Smoking is a common cause of bronchitis and can contribute to pneumonia. Stopping this habit is the single most important thing you can do to help yourself.  TREATMENT    Your caregiver may prescribe an antibiotic if the cough is caused by bacteria. Also, medicines that open up your airways make it easier to breathe. Your caregiver may also recommend or prescribe an expectorant. It will loosen the mucus to be coughed up. Only take over-the-counter or prescription medicines for pain, discomfort, or fever as directed   getting worse.   Cough suppressants may be prescribed for relief of cough symptoms.   Inhaled medicines may be prescribed to help with symptoms now and to help prevent problems from returning.   For those with recurrent (chronic) bronchitis, there may be a need for steroid medicines.  SEEK IMMEDIATE MEDICAL CARE  IF:   During treatment, you develop more pus-like mucus (purulent sputum).   You have a fever.   Your baby is older than 3 months with a rectal temperature of 102 F (38.9 C) or higher.   Your baby is 67 months old or younger with a rectal temperature of 100.4 F (38 C) or higher.   You become progressively more ill.   You have increased difficulty breathing, wheezing, or shortness of breath.  It is necessary to seek immediate medical care if you are elderly or sick from any other disease. MAKE SURE YOU:   Understand these instructions.   Will watch your condition.   Will get help right away if you are not doing well or get worse.  Document Released: 02/04/2005 Document Revised: 01/24/2011 Document Reviewed: 12/15/2007 [facility] Patient Information 2012 [place], [place].  Amoxicillin capsules or tablets What is this medicine? AMOXICILLIN (a mox i SIL in) is a penicillin antibiotic. It is used to treat certain kinds of bacterial infections. It will not work for colds, flu, or other viral infections. This medicine may be used for other purposes; ask your health care provider or pharmacist if you have questions. What should I tell my health care provider before I take this medicine? They need to know if you have any of these conditions: -asthma -kidney disease -an unusual or allergic reaction to amoxicillin, other penicillins, cephalosporin antibiotics, other medicines, foods, dyes, or preservatives -pregnant or trying to get pregnant -breast-feeding How should I use this medicine? Take this medicine by mouth with a glass of water. Follow the directions on your prescription label. You may take this medicine with food or on an empty stomach. Take your medicine at regular intervals. Do not take your medicine more often than directed. Take all of your medicine as directed even if you think your are better. Do not skip doses or stop your medicine early. Talk to your pediatrician  regarding the use of this medicine in children. While this drug may be prescribed for selected conditions, precautions do apply. Overdosage: If you think you have taken too much of this medicine contact a poison control center or emergency room at once. NOTE: This medicine is only for you. Do not share this medicine with others. What if I miss a dose? If you miss a dose, take it as soon as you can. If it is almost time for your next dose, take only that dose. Do not take double or extra doses. What may interact with this medicine? -amiloride -birth control pills -chloramphenicol -macrolides -probenecid -sulfonamides -tetracyclines This list may not describe all possible interactions. Give your health care provider a list of all the medicines, herbs, non-prescription drugs, or dietary supplements you use. Also tell them if you smoke, drink alcohol, or use illegal drugs. Some items may interact with your medicine. What should I watch for while using this medicine? Tell your doctor or health care professional if your symptoms do not improve in 2 or 3 days. Take all of the doses of your medicine as directed. Do not skip doses or stop your medicine early. If you are diabetic, you may get a false positive result for sugar  in your urine with certain brands of urine tests. Check with your doctor. Do not treat diarrhea with over-the-counter products. Contact your doctor if you have diarrhea that lasts more than 2 days or if the diarrhea is severe and watery. What side effects may I notice from receiving this medicine? Side effects that you should report to your doctor or health care professional as soon as possible: -allergic reactions like skin rash, itching or hives, swelling of the face, lips, or tongue -breathing problems -dark urine -redness, blistering, peeling or loosening of the skin, including inside the mouth -seizures -severe or watery diarrhea -trouble passing urine or change in the  amount of urine -unusual bleeding or bruising -unusually weak or tired -yellowing of the eyes or skin Side effects that usually do not require medical attention (report to your doctor or health care professional if they continue or are bothersome): -dizziness -headache -stomach upset -trouble sleeping This list may not describe all possible side effects. Call your doctor for medical advice about side effects. You may report side effects to FDA at 1-800-FDA-1088. Where should I keep my medicine? Keep out of the reach of children. Store between 68 and 77 degrees F (20 and 25 degrees C). Keep bottle closed tightly. Throw away any unused medicine after the expiration date. NOTE: This sheet is a summary. It may not cover all possible information. If you have questions about this medicine, talk to your doctor, pharmacist, or health care provider.  2012, [employer]. (04/28/2007 2:10:59 PM)  Albuterol inhalation aerosol What is this medicine? ALBUTEROL (al Gaspar Bidding) is a bronchodilator. It helps open up the airways in your lungs to make it easier to breathe. This medicine is used to treat and to prevent bronchospasm. This medicine may be used for other purposes; ask your health care provider or pharmacist if you have questions. What should I tell my health care provider before I take this medicine? They need to know if you have any of the following conditions: -diabetes -heart disease or irregular heartbeat -high blood pressure -pheochromocytoma -seizures -thyroid disease -an unusual or allergic reaction to albuterol, levalbuterol, sulfites, other medicines, foods, dyes, or preservatives -pregnant or trying to get pregnant -breast-feeding How should I use this medicine? This medicine is for inhalation through the mouth. Follow the directions on your prescription label. Take your medicine at regular intervals. Do not use more often than directed. Make sure that you are using  your inhaler correctly. Ask you doctor or health care provider if you have any questions. Use this medicine before you use any other inhaler. Wait 5 minutes or more before between using different inhalers. Talk to your pediatrician regarding the use of this medicine in children. Special care may be needed. Overdosage: If you think you have taken too much of this medicine contact a poison control center or emergency room at once. NOTE: This medicine is only for you. Do not share this medicine with others. What if I miss a dose? If you miss a dose, use it as soon as you can. If it is almost time for your next dose, use only that dose. Do not use double or extra doses. What may interact with this medicine? -anti-infectives like chloroquine and pentamidine -caffeine -cisapride -diuretics -medicines for colds -medicines for depression or for emotional or psychotic conditions -medicines for weight loss including some herbal products -methadone -some antibiotics like clarithromycin, erythromycin, levofloxacin, and linezolid -some heart medicines -steroid hormones like dexamethasone, cortisone, hydrocortisone -theophylline -thyroid hormones  This list may not describe all possible interactions. Give your health care provider a list of all the medicines, herbs, non-prescription drugs, or dietary supplements you use. Also tell them if you smoke, drink alcohol, or use illegal drugs. Some items may interact with your medicine. What should I watch for while using this medicine? Tell your doctor or health care professional if your symptoms do not improve. Do not use extra albuterol. If your asthma or bronchitis gets worse while you are using this medicine, call your doctor right away. If your mouth gets dry try chewing sugarless gum or sucking hard candy. Drink water as directed. What side effects may I notice from receiving this medicine? Side effects that you should report to your doctor or health care  professional as soon as possible: -allergic reactions like skin rash, itching or hives, swelling of the face, lips, or tongue -breathing problems -chest pain -feeling faint or lightheaded, falls -high blood pressure -irregular heartbeat -fever -muscle cramps or weakness -pain, tingling, numbness in the hands or feet -vomiting Side effects that usually do not require medical attention (report to your doctor or health care professional if they continue or are bothersome): -cough -difficulty sleeping -headache -nervousness or trembling -stomach upset -stuffy or runny nose -throat irritation -unusual taste This list may not describe all possible side effects. Call your doctor for medical advice about side effects. You may report side effects to FDA at 1-800-FDA-1088. Where should I keep my medicine? Keep out of the reach of children. Store at room temperature between 15 and 30 degrees C (59 and 86 degrees F). The contents are under pressure and may burst when exposed to heat or flame. Do not freeze. This medicine does not work as well if it is too cold. Throw away any unused medicine after the expiration date. Inhalers need to be thrown away after the labeled number of puffs have been used or by the expiration date; whichever comes first. Ventolin HFA should be thrown away 12 months after removing from foil pouch. Check the instructions that come with your medicine. NOTE: This sheet is a summary. It may not cover all possible information. If you have questions about this medicine, talk to your doctor, pharmacist, or health care provider.  2012, [employer]. (06/22/2010 11:00:52 AM)  Prednisone tablets What is this medicine? PREDNISONE (PRED ni sone) is a corticosteroid. It is commonly used to treat inflammation of the skin, joints, lungs, and other organs. Common conditions treated include asthma, allergies, and arthritis. It is also used for other conditions, such as blood  disorders and diseases of the adrenal glands. This medicine may be used for other purposes; ask your health care provider or pharmacist if you have questions. What should I tell my health care provider before I take this medicine? They need to know if you have any of these conditions: -Cushing's syndrome -diabetes -glaucoma -heart disease -high blood pressure -infection (especially a virus infection such as chickenpox, cold sores, or herpes) -kidney disease -liver disease -mental illness -myasthenia gravis -osteoporosis -seizures -stomach or intestine problems -thyroid disease -an unusual or allergic reaction to lactose, prednisone, other medicines, foods, dyes, or preservatives -pregnant or trying to get pregnant -breast-feeding How should I use this medicine? Take this medicine by mouth with a glass of water. Follow the directions on the prescription label. Take this medicine with food. If you are taking this medicine once a day, take it in the morning. Do not take more medicine than you are told to  take. Do not suddenly stop taking your medicine because you may develop a severe reaction. Your doctor will tell you how much medicine to take. If your doctor wants you to stop the medicine, the dose may be slowly lowered over time to avoid any side effects. Talk to your pediatrician regarding the use of this medicine in children. Special care may be needed. Overdosage: If you think you have taken too much of this medicine contact a poison control center or emergency room at once. NOTE: This medicine is only for you. Do not share this medicine with others. What if I miss a dose? If you miss a dose, take it as soon as you can. If it is almost time for your next dose, talk to your doctor or health care professional. You may need to miss a dose or take an extra dose. Do not take double or extra doses without advice. What may interact with this medicine? Do not take this medicine with any of  the following medications: -metyrapone -mifepristone This medicine may also interact with the following medications: -aminoglutethimide -amphotericin B -aspirin and aspirin-like medicines -barbiturates -certain medicines for diabetes, like glipizide or glyburide -cholestyramine -cholinesterase inhibitors -cyclosporine -digoxin -diuretics -ephedrine -female hormones, like estrogens and birth control pills -isoniazid -ketoconazole -NSAIDS, medicines for pain and inflammation, like ibuprofen or naproxen -phenytoin -rifampin -toxoids -vaccines -warfarin This list may not describe all possible interactions. Give your health care provider a list of all the medicines, herbs, non-prescription drugs, or dietary supplements you use. Also tell them if you smoke, drink alcohol, or use illegal drugs. Some items may interact with your medicine. What should I watch for while using this medicine? Visit your doctor or health care professional for regular checks on your progress. If you are taking this medicine over a prolonged period, carry an identification card with your name and address, the type and dose of your medicine, and your doctor's name and address. This medicine may increase your risk of getting an infection. Tell your doctor or health care professional if you are around anyone with measles or chickenpox, or if you develop sores or blisters that do not heal properly. If you are going to have surgery, tell your doctor or health care professional that you have taken this medicine within the last twelve months. Ask your doctor or health care professional about your diet. You may need to lower the amount of salt you eat. This medicine may affect blood sugar levels. If you have diabetes, check with your doctor or health care professional before you change your diet or the dose of your diabetic medicine. What side effects may I notice from receiving this medicine? Side effects that you should  report to your doctor or health care professional as soon as possible: -allergic reactions like skin rash, itching or hives, swelling of the face, lips, or tongue -changes in emotions or moods -changes in vision -depressed mood -eye pain -fever or chills, cough, sore throat, pain or difficulty passing urine -increased thirst -swelling of ankles, feet Side effects that usually do not require medical attention (report to your doctor or health care professional if they continue or are bothersome): -confusion, excitement, restlessness -headache -nausea, vomiting -skin problems, acne, thin and shiny skin -trouble sleeping -weight gain This list may not describe all possible side effects. Call your doctor for medical advice about side effects. You may report side effects to FDA at 1-800-FDA-1088. Where should I keep my medicine? Keep out of the reach of children.  Store at room temperature between 15 and 30 degrees C (59 and 86 degrees F). Protect from light. Keep container tightly closed. Throw away any unused medicine after the expiration date. NOTE: This sheet is a summary. It may not cover all possible information. If you have questions about this medicine, talk to your doctor, pharmacist, or health care provider.  2012, [employer]. (09/20/2010 10:57:14 AM)  Hydrocodone oral syrup  What is this medicine? HYDROCODONE (hye droe KOE done) is used to help relieve cough. This medicine may be used for other purposes; ask your health care provider or pharmacist if you have questions. What should I tell my health care provider before I take this medicine? They need to know if you have any of these conditions: -brain tumor -drug abuse or addiction -head injury -heart disease -if you frequently drink alcohol-containing drinks -kidney disease -liver disease -lung disease, asthma, or breathing problems -mental problems -an allergic reaction to hydrocodone, other opioid analgesics,  other medicines, foods, dyes, or preservatives -pregnant or trying to get pregnant -breast-feeding How should I use this medicine? Take this medicine by mouth with a full glass of water. Use a specially marked spoon or dropper to measure your dose. Ask your pharmacist if you do not have one. Do not use a household spoon. Follow the directions on the prescription label. Take your medicine at regular intervals. Do not take your medicine more often than directed. Talk to your pediatrician regarding the use of this medicine in children. This medicine is not approved for use in children less than 70 years old. Patients over 63 years old may have a stronger reaction and need a smaller dose. Overdosage: If you think you have taken too much of this medicine contact a poison control center or emergency room at once. NOTE: This medicine is only for you. Do not share this medicine with others. What if I miss a dose? If you miss a dose, take it as soon as you can. If it is almost time for your next dose, take only that dose. Do not take double or extra doses. What may interact with this medicine? -alcohol or medicines that contain alcohol -antihistamines -MAOIs like Carbex, Eldepryl, Marplan, Nardil, and Parnate -medicines for depression, anxiety, or psychotic disturbances -medicines for pain -medicines for sleep -naltrexone -tricyclic antidepressants like amitriptyline, clomipramine, desipramine, doxepin, imipramine, nortriptyline, and protriptyline This list may not describe all possible interactions. Give your health care provider a list of all the medicines, herbs, non-prescription drugs, or dietary supplements you use. Also tell them if you smoke, drink alcohol, or use illegal drugs. Some items may interact with your medicine. What should I watch for while using this medicine? You may develop tolerance to this medicine if you take it for a long time. Tolerance means that you will get less cough relief  with time. Tell your doctor or health care professional if your symptoms do not improve or if they get worse. You may get drowsy or dizzy. Do not drive, use machinery, or do anything that needs mental alertness until you know how this medicine affects you. Do not stand or sit up quickly, especially if you are an older patient. This reduces the risk of dizzy or fainting spells. Alcohol may interfere with the effect of this medicine. Avoid alcoholic drinks. This medicine will cause constipation. Try to have a bowel movement at least every 2 to 3 days. If you do not have a bowel movement for 3 days, call your doctor or  health care professional. What side effects may I notice from receiving this medicine? Side effects that you should report to your doctor or health care professional as soon as possible: -allergic reactions like skin rash, itching or hives, swelling of the face, lips, or tongue -breathing difficulties, wheezing -confusion -light headedness or fainting spells Side effects that usually do not require medical attention (report to your doctor or health care professional if they continue or are bothersome): -dizziness -drowsiness -itching -nausea -vomiting This list may not describe all possible side effects. Call your doctor for medical advice about side effects. You may report side effects to FDA at 1-800-FDA-1088. Where should I keep my medicine? Keep out of the reach of children. This medicine can be abused. Keep your medicine in a safe place to protect it from theft. Do not share this medicine with anyone. Selling or giving away this medicine is dangerous and against the law. Store at room temperature between 15 and 30 degrees C (59 and 86 degrees F). Protect from light. Throw away any unused medicine after the expiration date. NOTE: This sheet is a summary. It may not cover all possible information. If you have questions about this medicine, talk to your doctor, pharmacist, or health  care provider.  2012, [employer]. (06/19/2007 2:42:20 PM)

## 2011-06-22 IMAGING — CT CT ABD-PELV W/ CM
2 of 5 series · 17 of 46 positions shown, 19 images · IV contrast (Omnipaque 300)
Comparison: 12/06/2008

CLINICAL DATA: Abdominal pain.  Diarrhea.

CT ABDOMEN AND PELVIS WITH CONTRAST
TECHNIQUE: Multidetector CT imaging of the abdomen and pelvis was
performed following the standard protocol during bolus
administration of intravenous contrast.
Contrast: 100 ml [id number]-7HH

[Series 2: abd_pel_with 5.0 b40f · axial · 0.84mm/px · z∈[-376,-2]mm · 14 of 87 slices shown, 16 images]
[im 6/87  soft-tissue]
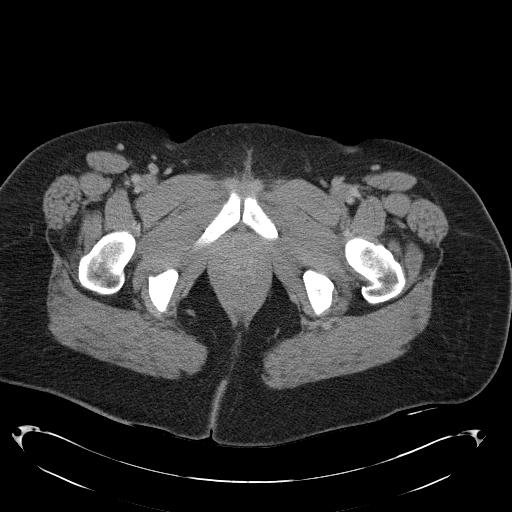
[im 6/87  bone]
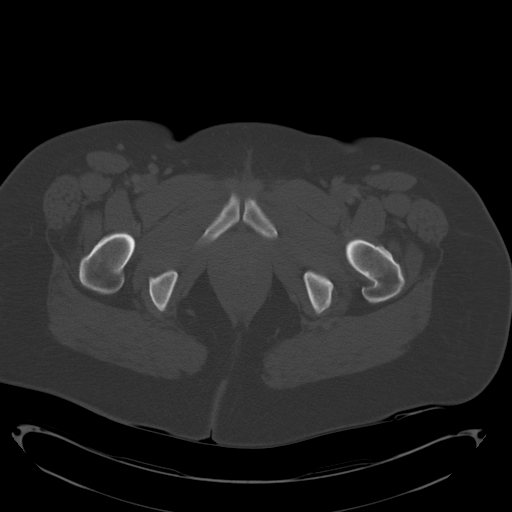
[im 11/87  soft-tissue]
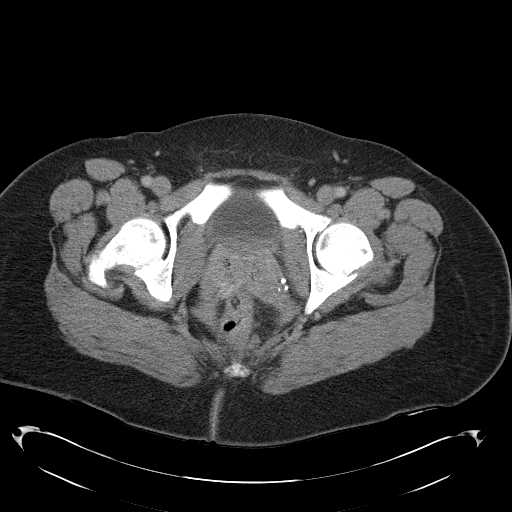
[im 16/87  soft-tissue]
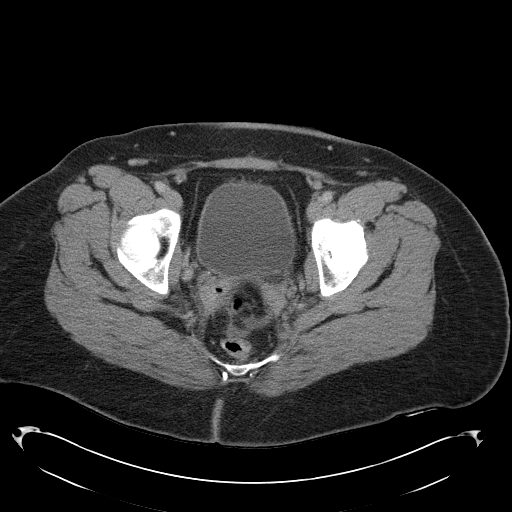
[im 26/87  soft-tissue]
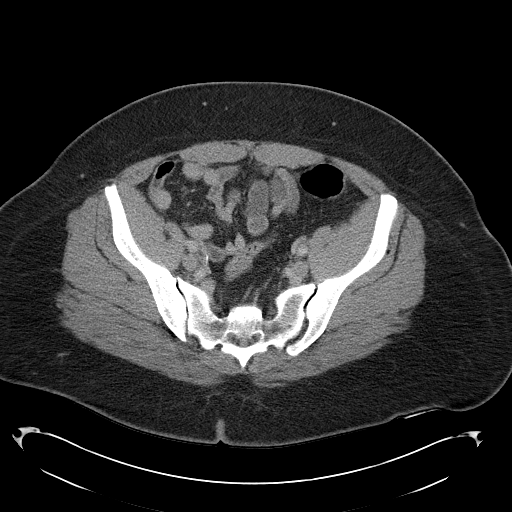
[im 31/87  soft-tissue]
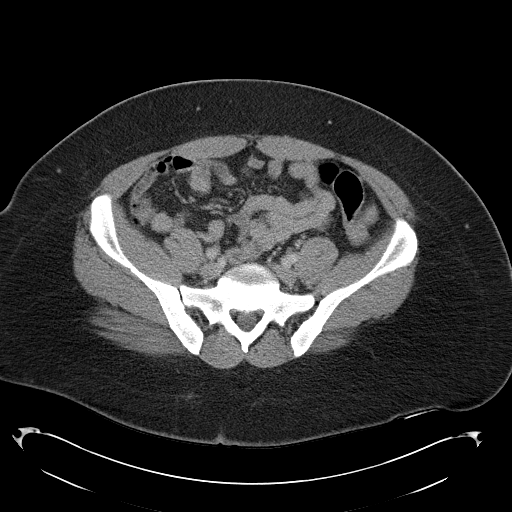
[im 36/87  soft-tissue]
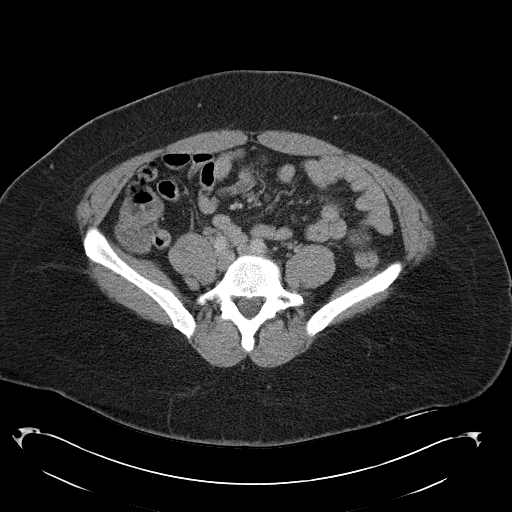
[im 41/87  soft-tissue]
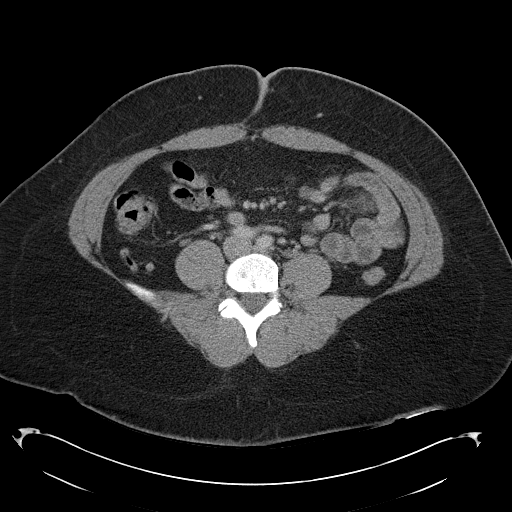
[im 46/87  soft-tissue]
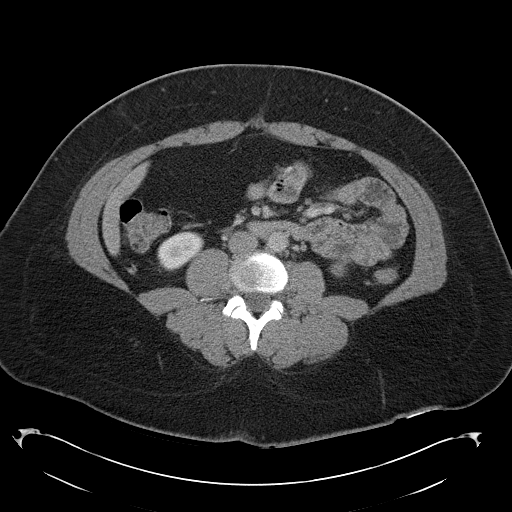
[im 51/87  soft-tissue]
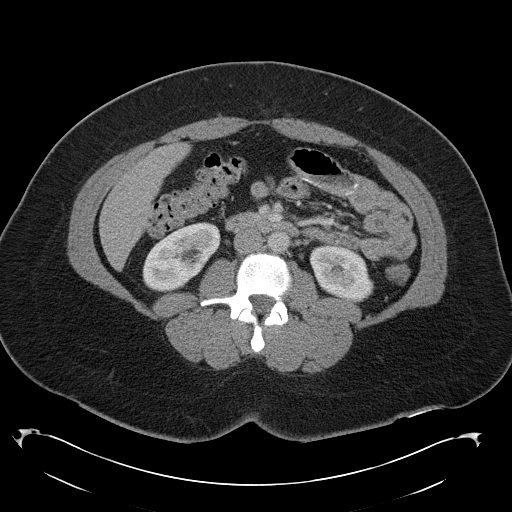
[im 51/87  bone]
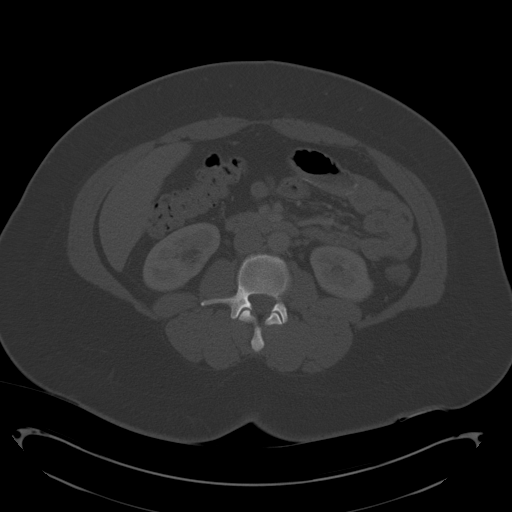
[im 56/87  soft-tissue]
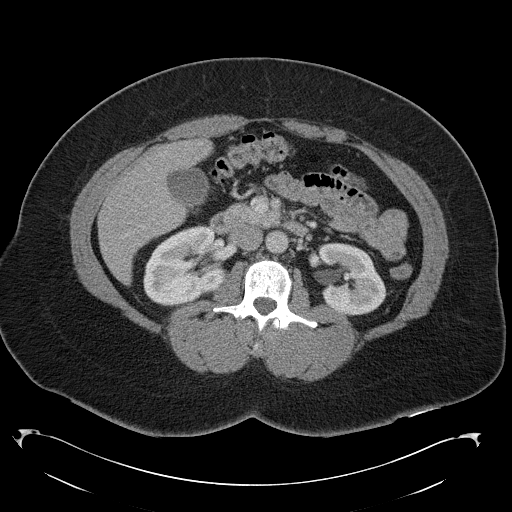
[im 66/87  soft-tissue]
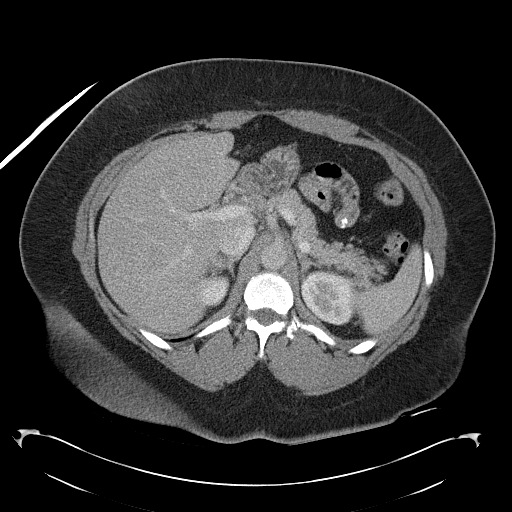
[im 71/87  soft-tissue]
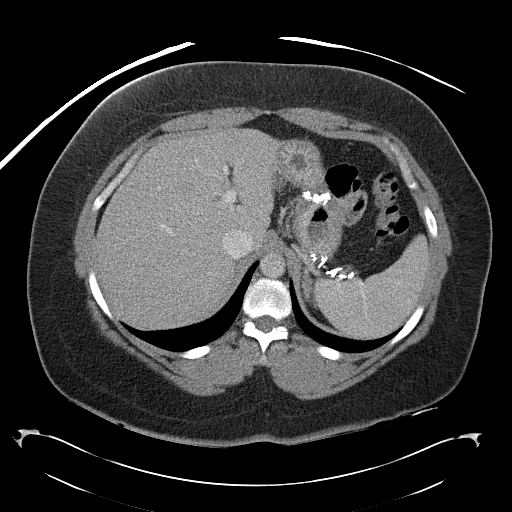
[im 76/87  soft-tissue]
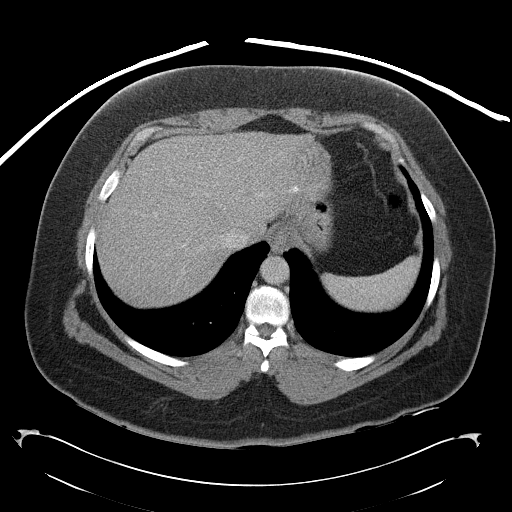
[im 81/87  soft-tissue]
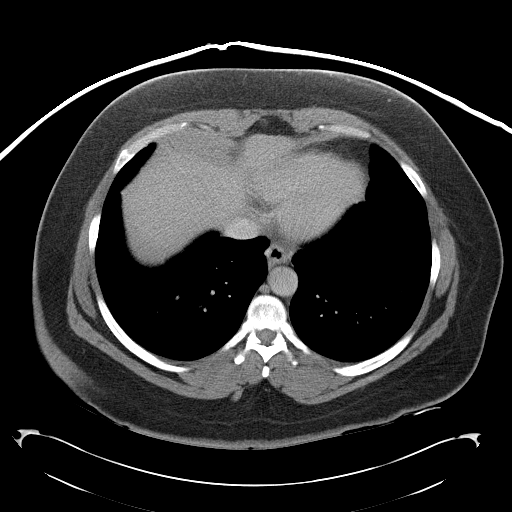

[Series 4: mpr cor post contrast (id) · coronal · 0.88mm/px · 3 of 89 slices shown]
[im 30/89  soft-tissue]
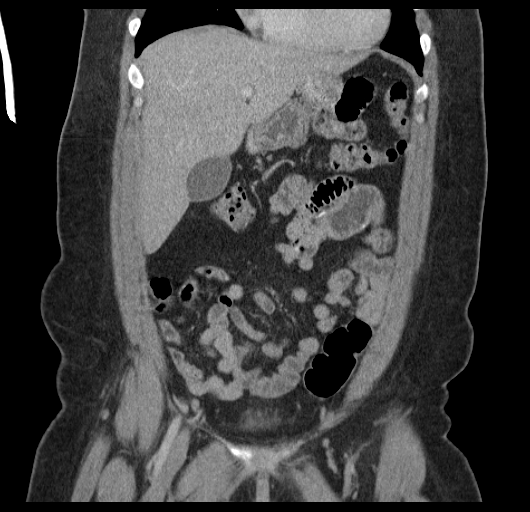
[im 40/89  soft-tissue]
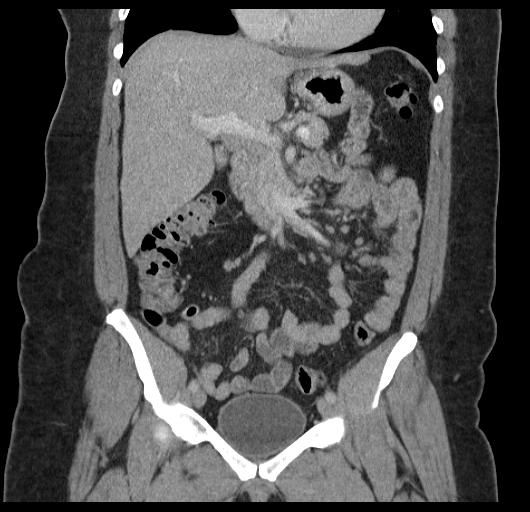
[im 49/89  soft-tissue]
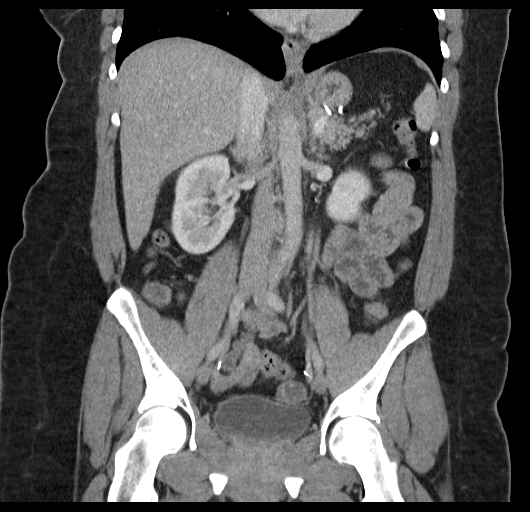

[17 of 46 positions shown; findings below may reference images not displayed]

FINDINGS: Gastric bypass noted. The liver, spleen, pancreas, and
adrenal glands appear unremarkable.

The kidneys appear unremarkable, as do the proximal ureters.

The appendix appears normal. No pathologic retroperitoneal or porta
hepatis adenopathy is identified.

The urinary bladder appears unremarkable.  The uterus is absent.  A
structure likely representing a left ovarian remnant measures 3.8 x
2.8 cm on image 68 of series #2 (formerly 3.8 x 2.3 cm).  This
structure has a cyst or follicle measuring 2.2 x 1.2 cm.  The right
ovary appears normal.

No pathologic pelvic adenopathy is identified.

There is stable trace fluid in the cul-de-sac.
IMPRESSION: 1.  Stable trace free fluid in the cul-de-sac, likely physiologic.
No significant abnormality identified.

## 2011-06-22 IMAGING — US US PELVIS COMPLETE MODIFY
1 series · 14 of 25 positions shown · non-contrast
Comparison: CT abdomen pelvis 03/12/2009 and pelvic ultrasound
02/05/2007

CLINICAL DATA: Nausea, vomiting, diarrhea and abdominal pain.
Evaluate for ovarian torsion.

TRANSABDOMINAL AND TRANSVAGINAL ULTRASOUND OF PELVIS
DOPPLER ULTRASOUND OF OVARIES
TECHNIQUE: Both transabdominal and transvaginal ultrasound
examinations of the pelvis were performed including evaluation of
the uterus, ovaries, adnexal regions, and pelvic cul-de-sac. Color
and duplex Doppler ultrasound was utilized to evaluate blood flow
to the ovaries.

[Series 1: us pelvis complete modify · 0.30mm/px · 14 of 45 slices shown]
[im 1/45]
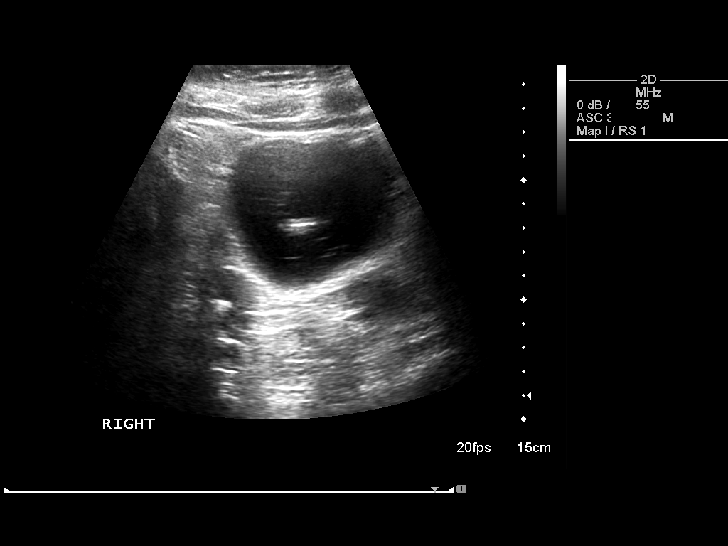
[im 4/45]
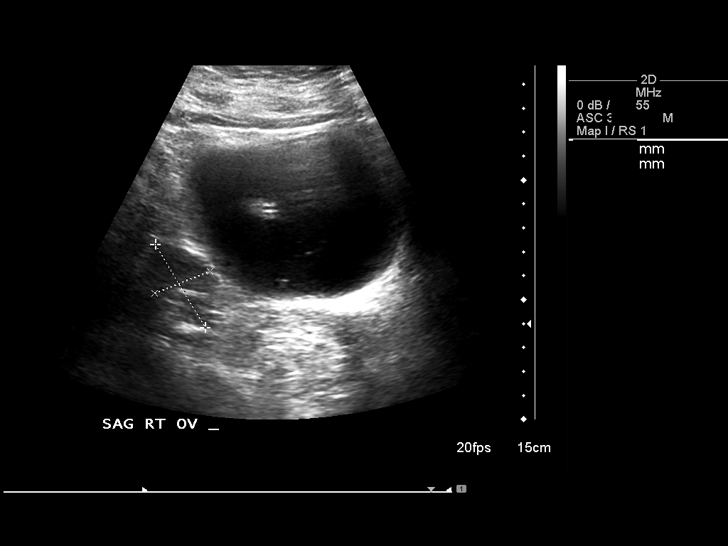
[im 8/45]
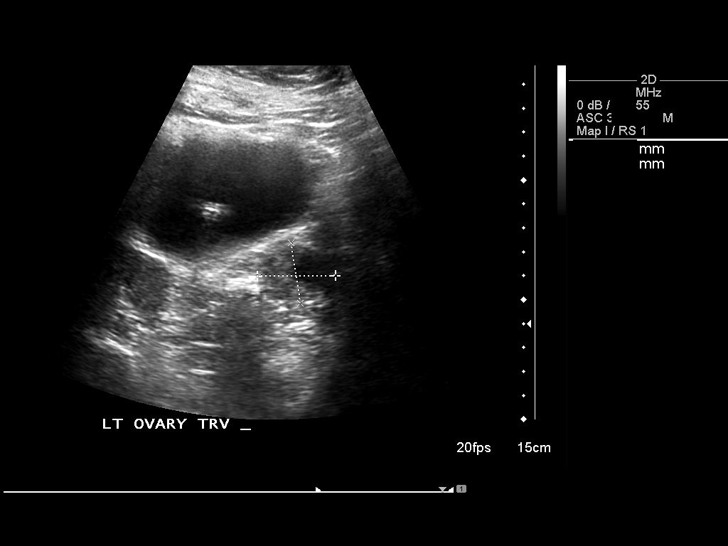
[im 12/45]
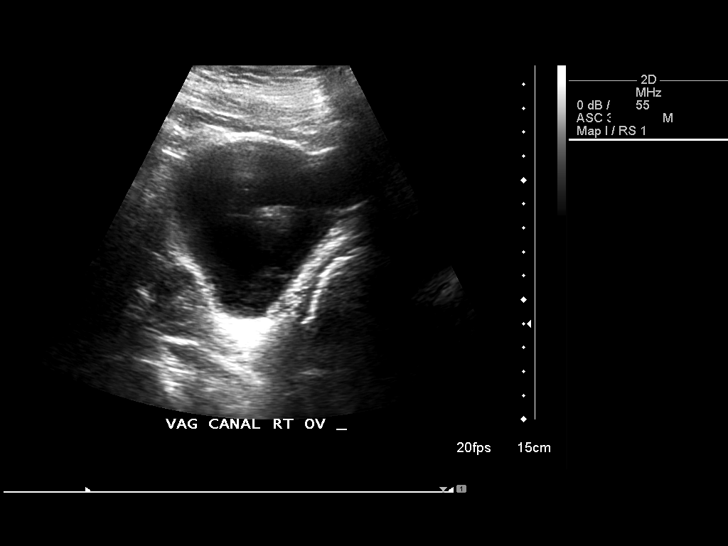
[im 15/45]
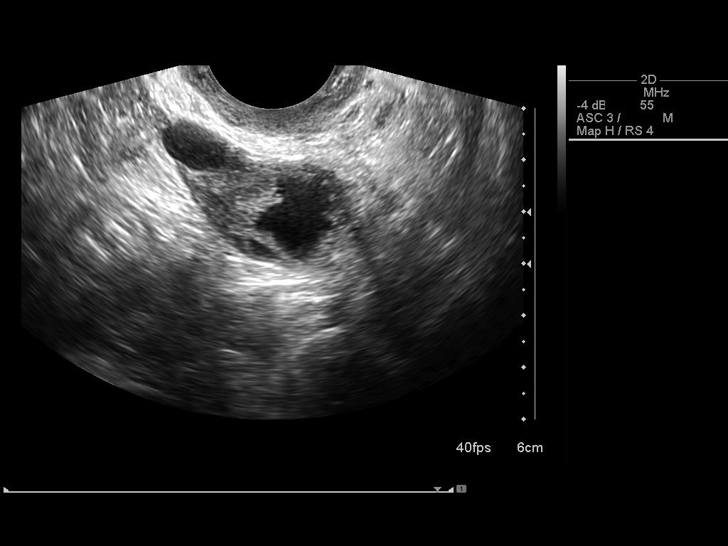
[im 17/45]
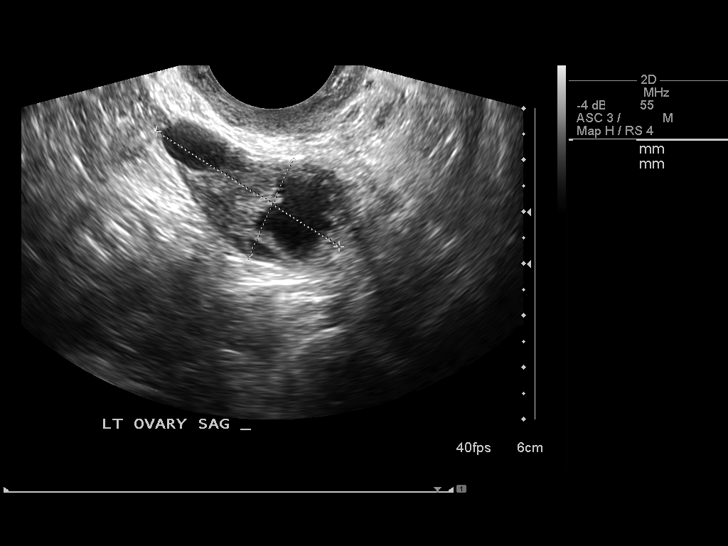
[im 21/45]
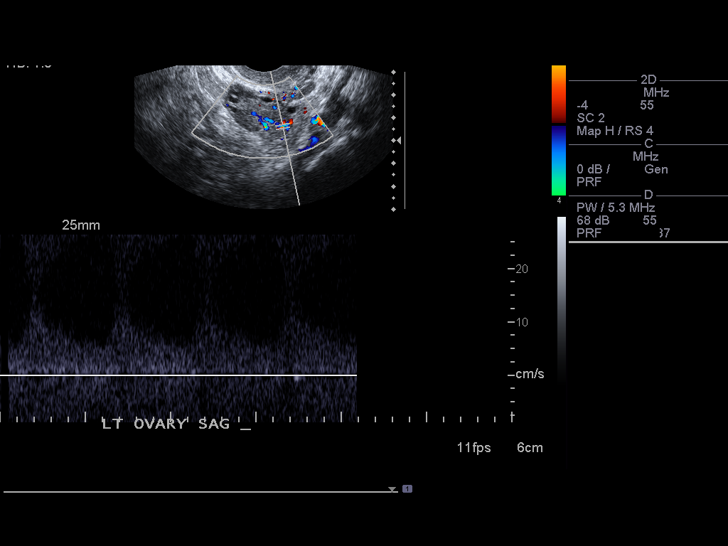
[im 24/45]
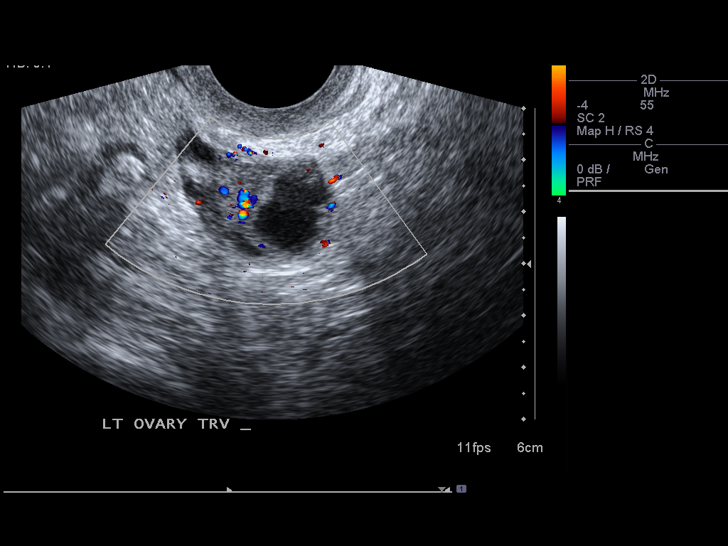
[im 28/45]
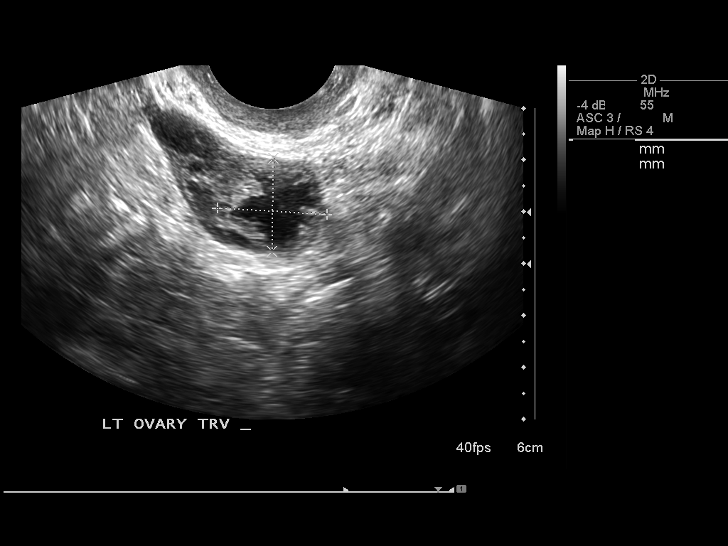
[im 30/45]
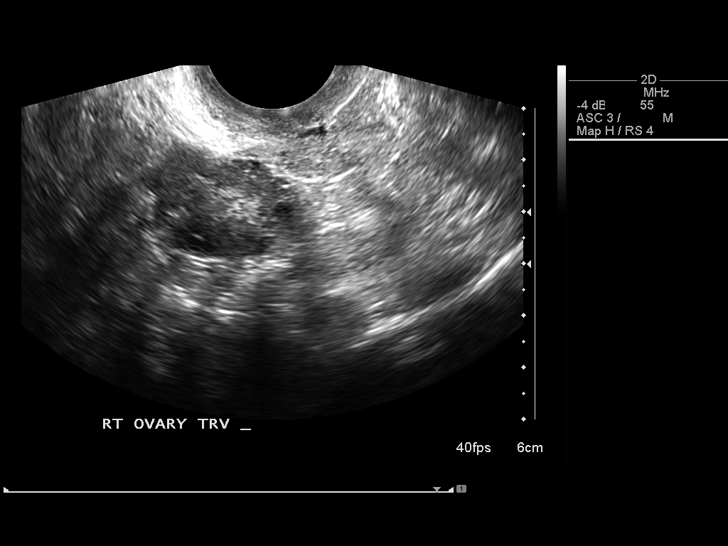
[im 34/45]
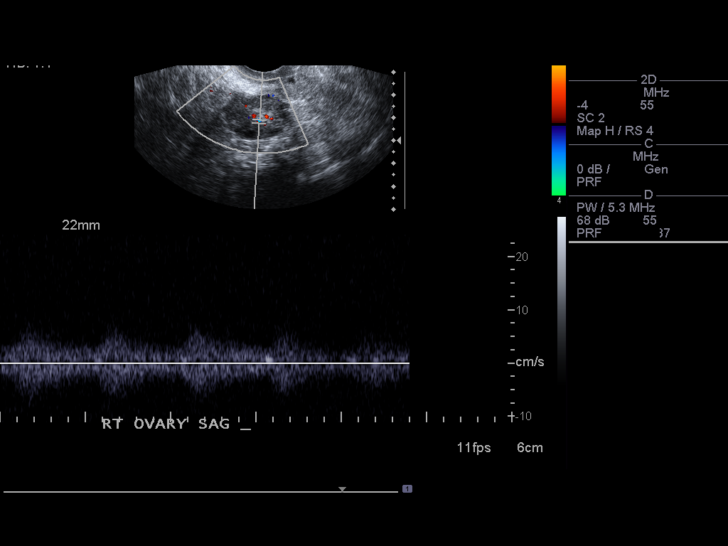
[im 37/45]
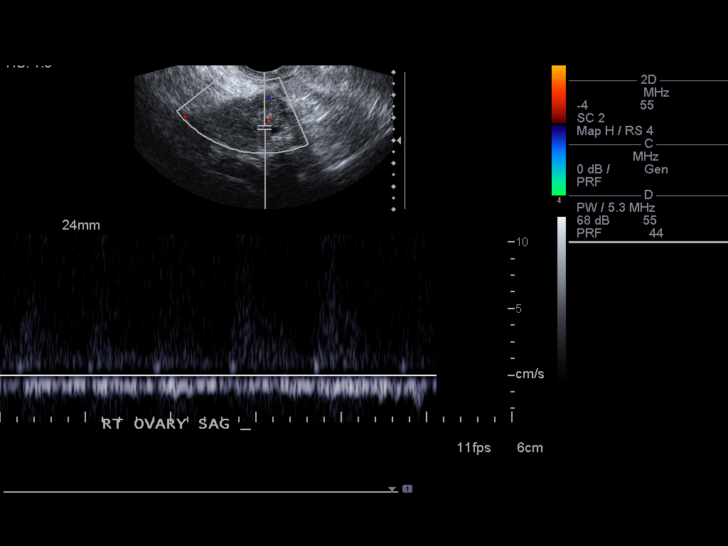
[im 41/45]
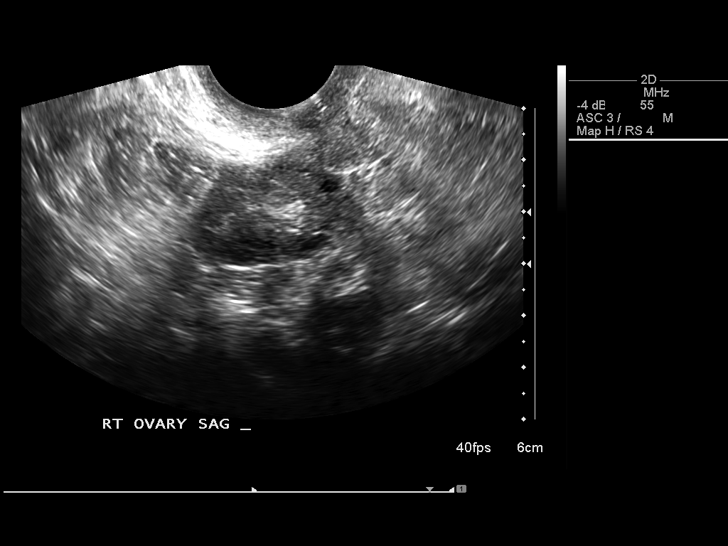
[im 45/45]
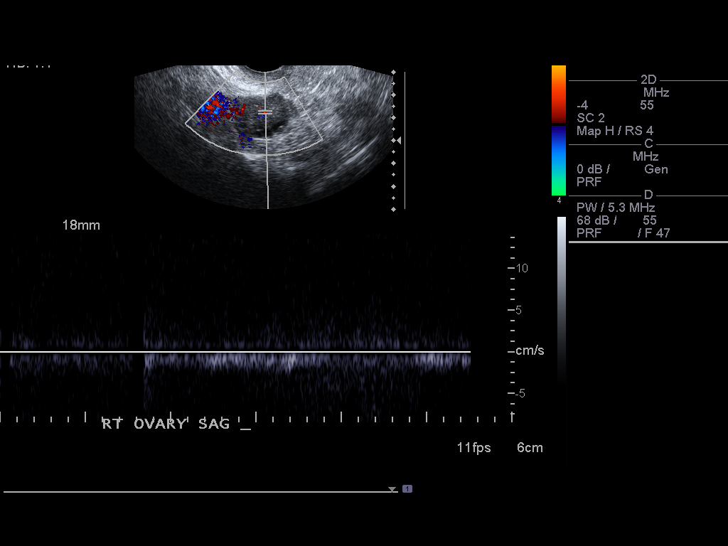

[14 of 25 positions shown; findings below may reference images not displayed]

FINDINGS: Uterus surgically absent.

Right Ovary measures 3 x 3 by 2 x 1 x 2.2 cm.  Arterial and venous
waveforms are identified.

Left Ovary measures 4.2 x 2.2 x 2.6 cm.  Arterial and venous
waveforms are identified.

Other Findings:  No free fluid.
IMPRESSION: No evidence of ovarian torsion or free fluid.

## 2011-08-12 ENCOUNTER — Other Ambulatory Visit (HOSPITAL_COMMUNITY): Payer: Self-pay | Admitting: Family Medicine

## 2011-08-12 DIAGNOSIS — N6452 Nipple discharge: Secondary | ICD-10-CM

## 2011-08-21 ENCOUNTER — Other Ambulatory Visit (HOSPITAL_COMMUNITY): Payer: Self-pay | Admitting: Family Medicine

## 2011-08-21 ENCOUNTER — Ambulatory Visit (HOSPITAL_COMMUNITY)
Admission: RE | Admit: 2011-08-21 | Discharge: 2011-08-21 | Disposition: A | Discharge status: Home / Self Care | Payer: Medicare HMO | Source: Ambulatory Visit | Attending: Family Medicine | Admitting: Family Medicine

## 2011-08-21 DIAGNOSIS — N6452 Nipple discharge: Secondary | ICD-10-CM

## 2011-08-21 DIAGNOSIS — N6459 Other signs and symptoms in breast: Secondary | ICD-10-CM | POA: Insufficient documentation

## 2011-08-28 ENCOUNTER — Inpatient Hospital Stay (HOSPITAL_COMMUNITY): Admission: RE | Admit: 2011-08-28 | Discharge: 2011-08-28 | Payer: Medicare HMO | Source: Ambulatory Visit

## 2011-08-28 ENCOUNTER — Other Ambulatory Visit (HOSPITAL_COMMUNITY): Payer: Self-pay | Admitting: Family Medicine

## 2011-08-28 ENCOUNTER — Ambulatory Visit (HOSPITAL_COMMUNITY)
Admission: RE | Admit: 2011-08-28 | Discharge: 2011-08-28 | Disposition: A | Discharge status: Home / Self Care | Payer: Medicare HMO | Source: Ambulatory Visit | Attending: Family Medicine | Admitting: Family Medicine

## 2011-08-28 DIAGNOSIS — N6452 Nipple discharge: Secondary | ICD-10-CM

## 2011-08-28 DIAGNOSIS — N6459 Other signs and symptoms in breast: Secondary | ICD-10-CM | POA: Insufficient documentation

## 2011-08-28 NOTE — Progress Notes (Signed)
Pt denies taking any anticoagulants.

## 2011-08-28 NOTE — Progress Notes (Signed)
1205 Ductogram Right Breast performed without difficulty.  Pt sent to mammography for pictures. Tolerated procedure well.

## 2011-11-04 ENCOUNTER — Other Ambulatory Visit (HOSPITAL_COMMUNITY): Payer: Self-pay | Admitting: Family Medicine

## 2011-11-04 ENCOUNTER — Ambulatory Visit (HOSPITAL_COMMUNITY)
Admission: RE | Admit: 2011-11-04 | Discharge: 2011-11-04 | Disposition: A | Discharge status: Home / Self Care | Payer: Medicare HMO | Source: Ambulatory Visit | Attending: Family Medicine | Admitting: Family Medicine

## 2011-11-04 DIAGNOSIS — M25579 Pain in unspecified ankle and joints of unspecified foot: Secondary | ICD-10-CM | POA: Insufficient documentation

## 2011-11-04 DIAGNOSIS — T148XXA Other injury of unspecified body region, initial encounter: Secondary | ICD-10-CM

## 2011-11-04 DIAGNOSIS — M25473 Effusion, unspecified ankle: Secondary | ICD-10-CM | POA: Insufficient documentation

## 2011-11-04 DIAGNOSIS — M25476 Effusion, unspecified foot: Secondary | ICD-10-CM | POA: Insufficient documentation

## 2011-11-04 DIAGNOSIS — S8990XA Unspecified injury of unspecified lower leg, initial encounter: Secondary | ICD-10-CM | POA: Insufficient documentation

## 2011-11-04 DIAGNOSIS — W19XXXA Unspecified fall, initial encounter: Secondary | ICD-10-CM | POA: Insufficient documentation

## 2011-11-15 ENCOUNTER — Other Ambulatory Visit: Payer: Self-pay | Admitting: Otolaryngology

## 2011-11-15 DIAGNOSIS — H9190 Unspecified hearing loss, unspecified ear: Secondary | ICD-10-CM

## 2011-11-25 ENCOUNTER — Ambulatory Visit
Admission: RE | Admit: 2011-11-25 | Discharge: 2011-11-25 | Disposition: A | Discharge status: Home / Self Care | Payer: Medicare HMO | Source: Ambulatory Visit | Attending: Otolaryngology | Admitting: Otolaryngology

## 2011-11-25 DIAGNOSIS — H9190 Unspecified hearing loss, unspecified ear: Secondary | ICD-10-CM

## 2011-11-25 MED ORDER — GADOBENATE DIMEGLUMINE 529 MG/ML IV SOLN
20.0000 mL | Freq: Once | INTRAVENOUS | Status: AC | PRN
  Administered 2011-11-25: 20 mL via INTRAVENOUS

## 2012-11-23 ENCOUNTER — Other Ambulatory Visit (HOSPITAL_COMMUNITY): Payer: Self-pay | Admitting: Family Medicine

## 2012-11-23 ENCOUNTER — Ambulatory Visit (HOSPITAL_COMMUNITY)
Admission: RE | Admit: 2012-11-23 | Discharge: 2012-11-23 | Disposition: A | Discharge status: Home / Self Care | Payer: Medicare Other | Source: Ambulatory Visit | Attending: Family Medicine | Admitting: Family Medicine

## 2012-11-23 DIAGNOSIS — M47817 Spondylosis without myelopathy or radiculopathy, lumbosacral region: Secondary | ICD-10-CM | POA: Insufficient documentation

## 2012-11-23 DIAGNOSIS — M545 Low back pain, unspecified: Secondary | ICD-10-CM | POA: Insufficient documentation

## 2012-11-23 DIAGNOSIS — M5137 Other intervertebral disc degeneration, lumbosacral region: Secondary | ICD-10-CM | POA: Insufficient documentation

## 2012-11-23 DIAGNOSIS — M51379 Other intervertebral disc degeneration, lumbosacral region without mention of lumbar back pain or lower extremity pain: Secondary | ICD-10-CM | POA: Insufficient documentation

## 2013-08-11 ENCOUNTER — Other Ambulatory Visit (HOSPITAL_COMMUNITY): Payer: Self-pay | Admitting: Family Medicine

## 2013-08-11 DIAGNOSIS — Z1231 Encounter for screening mammogram for malignant neoplasm of breast: Secondary | ICD-10-CM

## 2013-08-16 ENCOUNTER — Ambulatory Visit (HOSPITAL_COMMUNITY): Payer: Medicare Other

## 2013-08-19 ENCOUNTER — Ambulatory Visit (HOSPITAL_COMMUNITY)
Admission: RE | Admit: 2013-08-19 | Discharge: 2013-08-19 | Disposition: A | Discharge status: Home / Self Care | Payer: PRIVATE HEALTH INSURANCE | Source: Ambulatory Visit | Attending: Family Medicine | Admitting: Family Medicine

## 2013-08-19 DIAGNOSIS — Z1231 Encounter for screening mammogram for malignant neoplasm of breast: Secondary | ICD-10-CM | POA: Insufficient documentation

## 2013-10-12 ENCOUNTER — Encounter (HOSPITAL_COMMUNITY): Payer: Self-pay | Admitting: Emergency Medicine

## 2013-10-12 ENCOUNTER — Emergency Department (HOSPITAL_COMMUNITY)
Admission: EM | Admit: 2013-10-12 | Discharge: 2013-10-12 | Disposition: A | Discharge status: Home / Self Care | Payer: PRIVATE HEALTH INSURANCE | Attending: Emergency Medicine | Admitting: Emergency Medicine

## 2013-10-12 DIAGNOSIS — M545 Low back pain, unspecified: Secondary | ICD-10-CM | POA: Diagnosis not present

## 2013-10-12 DIAGNOSIS — I10 Essential (primary) hypertension: Secondary | ICD-10-CM | POA: Diagnosis not present

## 2013-10-12 DIAGNOSIS — M549 Dorsalgia, unspecified: Secondary | ICD-10-CM | POA: Diagnosis present

## 2013-10-12 DIAGNOSIS — R52 Pain, unspecified: Secondary | ICD-10-CM | POA: Insufficient documentation

## 2013-10-12 DIAGNOSIS — G8929 Other chronic pain: Secondary | ICD-10-CM | POA: Diagnosis not present

## 2013-10-12 DIAGNOSIS — Z87891 Personal history of nicotine dependence: Secondary | ICD-10-CM | POA: Diagnosis not present

## 2013-10-12 DIAGNOSIS — Z79899 Other long term (current) drug therapy: Secondary | ICD-10-CM | POA: Insufficient documentation

## 2013-10-12 MED ORDER — PREDNISONE 10 MG PO TABS
60.0000 mg | ORAL_TABLET | Freq: Once | ORAL | Status: AC
  Administered 2013-10-12: 60 mg via ORAL
  Filled 2013-10-12 (×2): qty 1

## 2013-10-12 MED ORDER — HYDROCODONE-ACETAMINOPHEN 5-325 MG PO TABS: 1.0000 | ORAL_TABLET | ORAL | Status: DC | PRN

## 2013-10-12 MED ORDER — PREDNISONE 10 MG PO TABS: ORAL_TABLET | ORAL | Status: DC

## 2013-10-12 MED ORDER — CYCLOBENZAPRINE HCL 5 MG PO TABS: 5.0000 mg | ORAL_TABLET | Freq: Three times a day (TID) | ORAL | Status: DC | PRN

## 2013-10-12 MED ORDER — HYDROMORPHONE HCL PF 1 MG/ML IJ SOLN
1.0000 mg | Freq: Once | INTRAMUSCULAR | Status: AC
  Administered 2013-10-12: 1 mg via INTRAMUSCULAR
  Filled 2013-10-12: qty 1

## 2013-10-12 NOTE — Discharge Instructions (Signed)
Back Pain, Adult Back pain is very common. The pain often gets better over time. The cause of back pain is usually not dangerous. Most people can learn to manage their back pain on their own.  HOME CARE   Stay active. Start with short walks on flat ground if you can. Try to walk farther each day.  Do not sit, drive, or stand in one place for more than 30 minutes. Do not stay in bed.  Do not avoid exercise or work. Activity can help your back heal faster.  Be careful when you bend or lift an object. Bend at your knees, keep the object close to you, and do not twist.  Sleep on a firm mattress. Lie on your side, and bend your knees. If you lie on your back, put a pillow under your knees.  Only take medicines as told by your doctor.  Put ice on the injured area.  Put ice in a plastic bag.  Place a towel between your skin and the bag.  Leave the ice on for 15-20 minutes, 03-04 times a day for the first 2 to 3 days. After that, you can switch between ice and heat packs.  Ask your doctor about back exercises or massage.  Avoid feeling anxious or stressed. Find good ways to deal with stress, such as exercise. GET HELP RIGHT AWAY IF:   Your pain does not go away with rest or medicine.  Your pain does not go away in 1 week.  You have new problems.  You do not feel well.  The pain spreads into your legs.  You cannot control when you poop (bowel movement) or pee (urinate).  Your arms or legs feel weak or lose feeling (numbness).  You feel sick to your stomach (nauseous) or throw up (vomit).  You have belly (abdominal) pain.  You feel like you may pass out (faint). MAKE SURE YOU:   Understand these instructions.  Will watch your condition.  Will get help right away if you are not doing well or get worse. Document Released: 07/24/2007 Document Revised: 04/29/2011 Document Reviewed: 06/08/2013 [facility] Patient Information 2015 [place], [place]. This information is not intended  to replace advice given to you by your health care provider. Make sure you discuss any questions you have with your health care provider.   Take your next dose of prednisone tomorrow morning.    Use the the other medicines as directed.  Do not drive within 4 hours of taking hydrocodone as this will make you drowsy.  Avoid lifting,  Bending,  Twisting or any other activity that worsens your pain over the next week. Use a heating pad for 20 minutes 3 times daily. You should get rechecked if your symptoms are not better over the next 5 days,  Or you develop increased pain,  Weakness in your leg(s) or loss of bladder or bowel function - these are symptoms of a worse injury.

## 2013-10-12 NOTE — ED Provider Notes (Signed)
Medical screening examination/treatment/procedure(s) were performed by non-physician practitioner and as supervising physician I was immediately available for consultation/collaboration.   EKG Interpretation None       Raeford Razor, MD 10/12/13 [phone]

## 2013-10-12 NOTE — ED Notes (Signed)
Patient denies pain and is resting comfortably.  

## 2013-10-12 NOTE — ED Notes (Signed)
Pt has chronic back pain due to DDD, unable to get injections for another month.

## 2013-10-12 NOTE — ED Provider Notes (Signed)
CSN: [id number]     Arrival date & time 10/12/13  0825 History   First MD Initiated Contact with Patient 10/12/13 [phone]     Chief Complaint  Patient presents with  . Back Pain     (Consider location/radiation/quality/duration/timing/severity/associated sxs/prior Treatment) The history is provided by the patient.   CHLO… BLEAK is a 48 y.o. female presenting with acute on chronic low back pain which has which has been worsened for the past week.   Patient denies any new injury specifically.  There is radiation into her right buttock.  There has been no weakness or numbness in the lower extremities and no urinary or bowel retention or incontinence.  Her chronic degenerative disc disease is treated by Dr. Eduard Clos with steroid injections, but she is not due for another injection for 2 months.  Patient does not have a history of cancer or IVDU.  She is taking no home medications for pain relief.  She states she often has difficulty getting out of bed because her pain is so severe.  She has had an MRI showing degenerative disc disease with no mention of needed surgical intervention.   Past Medical History  Diagnosis Date  . Hypertension    Past Surgical History  Procedure Laterality Date  . Arm surgery     History reviewed. No pertinent family history. History  Substance Use Topics  . Smoking status: Former Games developer  . Smokeless tobacco: Not on file  . Alcohol Use: No   OB History   Grav Para Term Preterm Abortions TAB SAB Ect Mult Living                 Review of Systems  Constitutional: Negative for fever.  Respiratory: Negative for shortness of breath.   Cardiovascular: Negative for chest pain and leg swelling.  Gastrointestinal: Negative for abdominal pain, constipation and abdominal distention.  Genitourinary: Negative for dysuria, urgency, frequency, flank pain and difficulty urinating.  Musculoskeletal: Positive for back pain. Negative for gait problem and joint swelling.   Skin: Negative for rash.  Neurological: Negative for weakness and numbness.      Allergies  Review of patient's allergies indicates no known allergies.  Home Medications   Prior to Admission medications   Medication Sig Start Date End Date Taking? Authorizing Provider  amLODipine (NORVASC) 10 MG tablet Take 10 mg by mouth daily.   Yes Historical Provider, MD  cloNIDine (CATAPRES) 0.2 MG tablet Take 0.2 mg by mouth 2 (two) times daily.   Yes Historical Provider, MD  labetalol (NORMODYNE) 200 MG tablet Take 200 mg by mouth 2 (two) times daily.   Yes Historical Provider, MD  LORazepam (ATIVAN) 1 MG tablet Take 1 mg by mouth at bedtime as needed.   Yes Historical Provider, MD  PARoxetine (PAXIL) 20 MG tablet Take 20 mg by mouth every morning.   Yes Historical Provider, MD  pregabalin (LYRICA) 150 MG capsule Take 150 mg by mouth 2 (two) times daily.   Yes Historical Provider, MD  topiramate (TOPAMAX) 200 MG tablet Take 200 mg by mouth at bedtime.   Yes Historical Provider, MD  gabapentin (NEURONTIN) 300 MG capsule Take 900 mg by mouth 3 (three) times daily.     Historical Provider, MD   BP 158/102  Pulse 70  Temp(Src) 98.5 F (36.9 C) (Oral)  Resp 18  Ht 5\' 4"  (1.626 m)  Wt 230 lb (104.327 kg)  BMI 39.46 kg/m2  SpO2 96% Physical Exam  Nursing note and vitals  reviewed. Constitutional: She appears well-developed and well-nourished.  HENT:  Head: Normocephalic.  Eyes: Conjunctivae are normal.  Neck: Normal range of motion. Neck supple.  Cardiovascular: Normal rate and intact distal pulses.   Pedal pulses normal.  Pulmonary/Chest: Effort normal.  Abdominal: Soft. Bowel sounds are normal. She exhibits no distension and no mass. There is no tenderness.  Musculoskeletal: Normal range of motion. She exhibits no edema.       Right shoulder: She exhibits normal pulse and normal strength.       Lumbar back: She exhibits tenderness. She exhibits no swelling, no edema and no spasm.   Tender to palpation along right paralumbar soft tissue with radiation into right buttock  Neurological: She is alert. She has normal strength. She displays no atrophy and no tremor. No sensory deficit. Gait normal.  Reflex Scores:      Patellar reflexes are 2+ on the right side and 2+ on the left side.      Achilles reflexes are 2+ on the right side and 2+ on the left side. No strength deficit noted in hip and knee flexor and extensor muscle groups.  Ankle flexion and extension intact.  Skin: Skin is warm and dry.  Psychiatric: She has a normal mood and affect.    ED Course  Procedures (including critical care time) Labs Review Labs Reviewed - No data to display  Imaging Review No results found.   EKG Interpretation None      MDM   Final diagnoses:  Chronic lumbar pain    Patient was given Dilaudid 1 mg IM, prednisone 60 mg by mouth.  She will be prescribed hydrocodone, six-day prednisone taper and Flexeril.  Encouraged heating pad to lower back.  Followup with Dr. Eduard Clos or PCP when necessary.  No neuro deficit on exam or by history to suggest emergent or surgical presentation.  Also discussed worsened sx that should prompt immediate re-evaluation including distal weakness, bowel/bladder retention/incontinence.          Burgess Amor, PA-C 10/12/13 [phone]

## 2014-01-30 ENCOUNTER — Encounter (HOSPITAL_COMMUNITY): Payer: Self-pay | Admitting: Emergency Medicine

## 2014-01-30 ENCOUNTER — Emergency Department (HOSPITAL_COMMUNITY): Payer: PRIVATE HEALTH INSURANCE

## 2014-01-30 ENCOUNTER — Emergency Department (HOSPITAL_COMMUNITY)
Admission: EM | Admit: 2014-01-30 | Discharge: 2014-01-30 | Disposition: A | Discharge status: Home / Self Care | Payer: PRIVATE HEALTH INSURANCE | Attending: Emergency Medicine | Admitting: Emergency Medicine

## 2014-01-30 DIAGNOSIS — R0602 Shortness of breath: Secondary | ICD-10-CM | POA: Insufficient documentation

## 2014-01-30 DIAGNOSIS — Z8739 Personal history of other diseases of the musculoskeletal system and connective tissue: Secondary | ICD-10-CM | POA: Insufficient documentation

## 2014-01-30 DIAGNOSIS — R079 Chest pain, unspecified: Secondary | ICD-10-CM | POA: Diagnosis present

## 2014-01-30 DIAGNOSIS — Z720 Tobacco use: Secondary | ICD-10-CM | POA: Insufficient documentation

## 2014-01-30 DIAGNOSIS — Z79899 Other long term (current) drug therapy: Secondary | ICD-10-CM | POA: Diagnosis not present

## 2014-01-30 DIAGNOSIS — R0789 Other chest pain: Secondary | ICD-10-CM

## 2014-01-30 DIAGNOSIS — I10 Essential (primary) hypertension: Secondary | ICD-10-CM | POA: Diagnosis not present

## 2014-01-30 DIAGNOSIS — Z7952 Long term (current) use of systemic steroids: Secondary | ICD-10-CM | POA: Diagnosis not present

## 2014-01-30 HISTORY — DX: Other intervertebral disc degeneration, lumbar region: M51.36

## 2014-01-30 HISTORY — DX: Other intervertebral disc degeneration, lumbar region without mention of lumbar back pain or lower extremity pain: M51.369

## 2014-01-30 LAB — CBC WITH DIFFERENTIAL/PLATELET
Basophils Absolute: 0 10*3/uL (ref 0.0–0.1)
Basophils Relative: 0 % (ref 0–1)
Eosinophils Absolute: 0.1 10*3/uL (ref 0.0–0.7)
Eosinophils Relative: 2 % (ref 0–5)
HCT: 40 % (ref 36.0–46.0)
Hemoglobin: 13 g/dL (ref 12.0–15.0)
Lymphocytes Relative: 40 % (ref 12–46)
Lymphs Abs: 1.8 10*3/uL (ref 0.7–4.0)
MCH: 27.4 pg (ref 26.0–34.0)
MCHC: 32.5 g/dL (ref 30.0–36.0)
MCV: 84.4 fL (ref 78.0–100.0)
Monocytes Absolute: 0.3 10*3/uL (ref 0.1–1.0)
Monocytes Relative: 6 % (ref 3–12)
Neutro Abs: 2.4 10*3/uL (ref 1.7–7.7)
Neutrophils Relative %: 52 % (ref 43–77)
Platelets: 416 10*3/uL — ABNORMAL HIGH (ref 150–400)
RBC: 4.74 MIL/uL (ref 3.87–5.11)
RDW: 16.6 % — ABNORMAL HIGH (ref 11.5–15.5)
WBC: 4.5 10*3/uL (ref 4.0–10.5)

## 2014-01-30 LAB — COMPREHENSIVE METABOLIC PANEL
ALT: 30 U/L (ref 0–35)
AST: 24 U/L (ref 0–37)
Albumin: 3.7 g/dL (ref 3.5–5.2)
Alkaline Phosphatase: 93 U/L (ref 39–117)
Anion gap: 14 (ref 5–15)
BUN: 9 mg/dL (ref 6–23)
CO2: 22 mEq/L (ref 19–32)
Calcium: 9 mg/dL (ref 8.4–10.5)
Chloride: 104 mEq/L (ref 96–112)
Creatinine, Ser: 0.97 mg/dL (ref 0.50–1.10)
GFR calc Af Amer: 78 mL/min — ABNORMAL LOW (ref >90)
GFR calc non Af Amer: 67 mL/min — ABNORMAL LOW (ref >90)
Glucose, Bld: 108 mg/dL — ABNORMAL HIGH (ref 70–99)
Potassium: 3.6 mEq/L — ABNORMAL LOW (ref 3.7–5.3)
Sodium: 140 mEq/L (ref 137–147)
Total Bilirubin: 0.4 mg/dL (ref 0.3–1.2)
Total Protein: 6.6 g/dL (ref 6.0–8.3)

## 2014-01-30 LAB — TROPONIN I: Troponin I: <0.3 ng/mL (ref <0.30)

## 2014-01-30 MED ORDER — KETOROLAC TROMETHAMINE 30 MG/ML IJ SOLN
30.0000 mg | Freq: Once | INTRAMUSCULAR | Status: AC
  Administered 2014-01-30: 30 mg via INTRAVENOUS

## 2014-01-30 MED ORDER — NAPROXEN 500 MG PO TABS: 500.0000 mg | ORAL_TABLET | Freq: Two times a day (BID) | ORAL | Status: DC

## 2014-01-30 MED ORDER — LORAZEPAM 2 MG/ML IJ SOLN
0.5000 mg | Freq: Once | INTRAMUSCULAR | Status: AC
  Administered 2014-01-30: 0.5 mg via INTRAVENOUS

## 2014-01-30 MED ORDER — LORAZEPAM 2 MG/ML IJ SOLN
INTRAMUSCULAR | Status: AC
  Filled 2014-01-30: qty 1

## 2014-01-30 MED ORDER — SODIUM CHLORIDE 0.9 % IV SOLN
INTRAVENOUS | Status: DC
  Administered 2014-01-30: 50 mL via INTRAVENOUS

## 2014-01-30 MED ORDER — METHOCARBAMOL 500 MG PO TABS: 500.0000 mg | ORAL_TABLET | Freq: Two times a day (BID) | ORAL | Status: DC

## 2014-01-30 MED ORDER — KETOROLAC TROMETHAMINE 30 MG/ML IJ SOLN
INTRAMUSCULAR | Status: AC
  Filled 2014-01-30: qty 1

## 2014-01-30 NOTE — ED Provider Notes (Signed)
CSN: [id number]     Arrival date & time 01/30/14  1840 History  This chart was scribed for Toy BakerAnthony T Jadene Stemmer, MD by Milly JakobJohn Lee Graves, ED Scribe. The patient was seen in room APA03/APA03. Patient's care was started at 7:05 PM.    Chief Complaint  Patient presents with  . Chest Pain   The history is provided by the patient. No language interpreter was used.    HPI Comments: [name] is a 48 y.o. female with a history of HTN who presents to the Emergency Department complaining of constant, sharp, tingling, left sided, chest pain which began 1 hour ago while she was at church. She states that the pain began when she was seated, and is exacerbated by palpation. She reports associated diaphoresis and mild SOB. She denise symptoms like this in the past or taking any medication for this. She reports that her legs have been swelling more than usual for the past few days. She denies fever or cough. Sx persistent and nothing makes them better, no tx used pta  Past Medical History  Diagnosis Date  . Hypertension   . DDD (degenerative disc disease), lumbar    Past Surgical History  Procedure Laterality Date  . Arm surgery    . Carpal tunnel release Right   . Partial hysterectomy     Family History  Problem Relation Age of Onset  . Cancer Other   . Diabetes Other   . Stroke Other    History  Substance Use Topics  . Smoking status: Current Every Day Smoker -- 0.50 packs/day for 32 years    Types: Cigarettes  . Smokeless tobacco: Never Used  . Alcohol Use: No   OB History    Gravida Para Term Preterm AB TAB SAB Ectopic Multiple Living   8 4 4  4  3 1  4      Review of Systems  Respiratory: Positive for shortness of breath.   Cardiovascular: Positive for chest pain.  All other systems reviewed and are negative.   Allergies  Adhesive  Home Medications   Prior to Admission medications   Medication Sig Start Date End Date Taking? Authorizing Provider  amLODipine (NORVASC) 10 MG  tablet Take 10 mg by mouth daily.    Historical Provider, MD  chlorpheniramine-HYDROcodone (TUSSIONEX PENNKINETIC ER) 10-8 MG/5ML LQCR Take 5 mLs by mouth every 12 (twelve) hours as needed. 05/24/11   Dione Boozeavid Glick, MD  cloNIDine (CATAPRES) 0.2 MG tablet Take 0.2 mg by mouth 2 (two) times daily.    Historical Provider, MD  cyclobenzaprine (FLEXERIL) 5 MG tablet Take 1 tablet (5 mg total) by mouth 3 (three) times daily as needed for muscle spasms. 10/12/13   Burgess AmorJulie Idol, PA-C  Dextromethorphan-Guaifenesin (ROBITUSSIN DM PO) Take 5-10 mLs by mouth daily as needed. FOR COUGH    Historical Provider, MD  diphenhydrAMINE (BENADRYL) 25 mg capsule Take 25 mg by mouth every 6 (six) hours as needed. FOR ALLERGY AND COLD RELIEF    Historical Provider, MD  gabapentin (NEURONTIN) 300 MG capsule Take 900 mg by mouth 3 (three) times daily.     Historical Provider, MD  HYDROcodone-acetaminophen (NORCO/VICODIN) 5-325 MG per tablet Take 1 tablet by mouth every 4 (four) hours as needed. 10/12/13   Burgess AmorJulie Idol, PA-C  labetalol (NORMODYNE) 200 MG tablet Take 200 mg by mouth 2 (two) times daily.    Historical Provider, MD  LORazepam (ATIVAN) 1 MG tablet Take 1 mg by mouth at bedtime as needed.  Historical Provider, MD  PARoxetine (PAXIL) 20 MG tablet Take 20 mg by mouth every morning.    Historical Provider, MD  predniSONE (DELTASONE) 10 MG tablet Take 2 tablets (20 mg total) by mouth daily. 05/24/11   Dione Booze, MD  predniSONE (DELTASONE) 10 MG tablet 6, 5, 4, 3, 2 then 1 tablet by mouth daily for 6 days total. 10/12/13   Burgess Amor, PA-C  pregabalin (LYRICA) 150 MG capsule Take 150 mg by mouth 2 (two) times daily.    Historical Provider, MD  topiramate (TOPAMAX) 200 MG tablet Take 200 mg by mouth at bedtime.    Historical Provider, MD  varenicline (CHANTIX PAK) 0.5 MG X 11 & 1 MG X 42 tablet Take by mouth 2 (two) times daily. Take one 0.5 mg tablet by mouth once daily for 3 days, then increase to one 0.5 mg tablet twice daily  for 4 days, then increase to one 1 mg tablet twice daily.    Historical Provider, MD   Triage Vitals: BP 138/93 mmHg  Pulse 93  Temp(Src) 98 F (36.7 C) (Oral)  Resp 19  Ht 5\' 4"  (1.626 m)  Wt 215 lb (97.523 kg)  BMI 36.89 kg/m2  SpO2 100% Physical Exam  Constitutional: She is oriented to person, place, and time. She appears well-developed and well-nourished.  Non-toxic appearance. No distress.  HENT:  Head: Normocephalic and atraumatic.  Eyes: Conjunctivae, EOM and lids are normal. Pupils are equal, round, and reactive to light.  Neck: Normal range of motion. Neck supple. No tracheal deviation present. No thyroid mass present.  Cardiovascular: Normal rate, regular rhythm and normal heart sounds.  Exam reveals no gallop.   No murmur heard. Pulmonary/Chest: Effort normal and breath sounds normal. No stridor. No respiratory distress. She has no decreased breath sounds. She has no wheezes. She has no rhonchi. She has no rales. She exhibits tenderness.  Tender to palpation left anterior chest wall, without crepitus  Abdominal: Soft. Normal appearance and bowel sounds are normal. She exhibits no distension. There is no tenderness. There is no rebound and no CVA tenderness.  Musculoskeletal: Normal range of motion. She exhibits no edema or tenderness.  Neurological: She is alert and oriented to person, place, and time. She has normal strength. No cranial nerve deficit or sensory deficit. GCS eye subscore is 4. GCS verbal subscore is 5. GCS motor subscore is 6.  Skin: Skin is warm and dry. No abrasion and no rash noted.  Psychiatric: She has a normal mood and affect. Her speech is normal and behavior is normal.  Nursing note and vitals reviewed.   ED Course  Procedures (including critical care time) DIAGNOSTIC STUDIES: Oxygen Saturation is 100% on room air, normal by my interpretation.    COORDINATION OF CARE: 7:11 PM-Discussed treatment plan which includes CXR, muscle relaxer, and lab  work with pt at bedside and pt agreed to plan.   Labs Review Labs Reviewed  TROPONIN I  CBC WITH DIFFERENTIAL  COMPREHENSIVE METABOLIC PANEL    Imaging Review No results found.   EKG Interpretation   Date/Time:  Sunday January 30 2014 18:48:28 EST Ventricular Rate:  88 PR Interval:  140 QRS Duration: 82 QT Interval:  381 QTC Calculation: 461 R Axis:   16 Text Interpretation:  Sinus rhythm Probable left atrial enlargement  Anteroseptal infarct, old No significant change since last tracing  Confirmed by Masao Junker  MD, Kaitlynne Wenz ([id number]) on 01/30/2014 7:14:24 PM       MDM   Final  diagnoses:  Chest pain    I personally performed the services described in this documentation, which was scribed in my presence. The recorded information has been reviewed and is accurate.   Patient given medications here feels better. Patient with likely chest wall pain. No concern for ACS or pulmonary embolism. Stable for discharge  Toy Baker, MD 01/30/14 2132

## 2014-01-30 NOTE — Discharge Instructions (Signed)
Chest Pain (Nonspecific) °It is often hard to give a specific diagnosis for the cause of chest pain. There is always a chance that your pain could be related to something serious, such as a heart attack or a blood clot in the lungs. You need to follow up with your health care provider for further evaluation. °CAUSES  °· Heartburn. °· Pneumonia or bronchitis. °· Anxiety or stress. °· Inflammation around your heart (pericarditis) or lung (pleuritis or pleurisy). °· A blood clot in the lung. °· A collapsed lung (pneumothorax). It can develop suddenly on its own (spontaneous pneumothorax) or from trauma to the chest. °· Shingles infection (herpes zoster virus). °The chest wall is composed of bones, muscles, and cartilage. Any of these can be the source of the pain. °· The bones can be bruised by injury. °· The muscles or cartilage can be strained by coughing or overwork. °· The cartilage can be affected by inflammation and become sore (costochondritis). °DIAGNOSIS  °Lab tests or other studies may be needed to find the cause of your pain. Your health care provider may have you take a test called an ambulatory electrocardiogram (ECG). An ECG records your heartbeat patterns over a 24-hour period. You may also have other tests, such as: °· Transthoracic echocardiogram (TTE). During echocardiography, sound waves are used to evaluate how blood flows through your heart. °· Transesophageal echocardiogram (TEE). °· Cardiac monitoring. This allows your health care provider to monitor your heart rate and rhythm in real time. °· Holter monitor. This is a portable device that records your heartbeat and can help diagnose heart arrhythmias. It allows your health care provider to track your heart activity for several days, if needed. °· Stress tests by exercise or by giving medicine that makes the heart beat faster. °TREATMENT  °· Treatment depends on what may be causing your chest pain. Treatment may include: °· Acid blockers for  heartburn. °· Anti-inflammatory medicine. °· Pain medicine for inflammatory conditions. °· Antibiotics if an infection is present. °· You may be advised to change lifestyle habits. This includes stopping smoking and avoiding alcohol, caffeine, and chocolate. °· You may be advised to keep your head raised (elevated) when sleeping. This reduces the chance of acid going backward from your stomach into your esophagus. °Most of the time, nonspecific chest pain will improve within 2-3 days with rest and mild pain medicine.  °HOME CARE INSTRUCTIONS  °· If antibiotics were prescribed, take them as directed. Finish them even if you start to feel better. °· For the next few days, avoid physical activities that bring on chest pain. Continue physical activities as directed. °· Do not use any tobacco products, including cigarettes, chewing tobacco, or electronic cigarettes. °· Avoid drinking alcohol. °· Only take medicine as directed by your health care provider. °· Follow your health care provider's suggestions for further testing if your chest pain does not go away. °· Keep any follow-up appointments you made. If you do not go to an appointment, you could develop lasting (chronic) problems with pain. If there is any problem keeping an appointment, call to reschedule. °SEEK MEDICAL CARE IF:  °· Your chest pain does not go away, even after treatment. °· You have a rash with blisters on your chest. °· You have a fever. °SEEK IMMEDIATE MEDICAL CARE IF:  °· You have increased chest pain or pain that spreads to your arm, neck, jaw, back, or abdomen. °· You have shortness of breath. °· You have an increasing cough, or you cough   up blood. °· You have severe back or abdominal pain. °· You feel nauseous or vomit. °· You have severe weakness. °· You faint. °· You have chills. °This is an emergency. Do not wait to see if the pain will go away. Get medical help at once. Call your local emergency services (911 in [place] Do not drive  yourself to the hospital. °MAKE SURE YOU:  °· Understand these instructions. °· Will watch your condition. °· Will get help right away if you are not doing well or get worse. °Document Released: 11/14/2004 Document Revised: 02/09/2013 Document Reviewed: 09/10/2007 °[employer]® Patient Information ©2015 [employer]. This information is not intended to replace advice given to you by your health care provider. Make sure you discuss any questions you have with your health care provider. ° °Chest Wall Pain °Chest wall pain is pain in or around the bones and muscles of your chest. It may take up to 6 weeks to get better. It may take longer if you must stay physically active in your work and activities.  °CAUSES  °Chest wall pain may happen on its own. However, it may be caused by: °· A viral illness like the flu. °· Injury. °· Coughing. °· Exercise. °· Arthritis. °· Fibromyalgia. °· Shingles. °HOME CARE INSTRUCTIONS  °· Avoid overtiring physical activity. Try not to strain or perform activities that cause pain. This includes any activities using your chest or your abdominal and side muscles, especially if heavy weights are used. °· Put ice on the sore area. °¨ Put ice in a plastic bag. °¨ Place a towel between your skin and the bag. °¨ Leave the ice on for 15-20 minutes per hour while awake for the first 2 days. °· Only take over-the-counter or prescription medicines for pain, discomfort, or fever as directed by your caregiver. °SEEK IMMEDIATE MEDICAL CARE IF:  °· Your pain increases, or you are very uncomfortable. °· You have a fever. °· Your chest pain becomes worse. °· You have new, unexplained symptoms. °· You have nausea or vomiting. °· You feel sweaty or lightheaded. °· You have a cough with phlegm (sputum), or you cough up blood. °MAKE SURE YOU:  °· Understand these instructions. °· Will watch your condition. °· Will get help right away if you are not doing well or get worse. °Document Released: 02/04/2005 Document  Revised: 04/29/2011 Document Reviewed: 10/01/2010 °[employer]® Patient Information ©2015 [employer]. This information is not intended to replace advice given to you by your health care provider. Make sure you discuss any questions you have with your health care provider. ° °

## 2014-01-30 NOTE — ED Notes (Addendum)
Patient c/o left side chest pain that radiates into left shoulder. Reports shortness of breath and diaphoresis. Denies any cardiac hx. Per patient pain started while she was singing at church.

## 2014-03-24 LAB — CBC
HCT: 42 % (ref 35.0–47.0)
HGB: 13.4 g/dL (ref 12.0–16.0)
MCH: 27.2 pg (ref 26.0–34.0)
MCHC: 32 g/dL (ref 32.0–36.0)
MCV: 85 fL (ref 80–100)
Platelet: 419 10*3/uL (ref 150–440)
RBC: 4.93 10*6/uL (ref 3.80–5.20)
RDW: 16.5 % — ABNORMAL HIGH (ref 11.5–14.5)
WBC: 4.6 10*3/uL (ref 3.6–11.0)

## 2014-03-24 LAB — COMPREHENSIVE METABOLIC PANEL
Albumin: 3.9 g/dL (ref 3.4–5.0)
Alkaline Phosphatase: 111 U/L (ref 46–116)
Anion Gap: 5 — ABNORMAL LOW (ref 7–16)
BUN: 16 mg/dL (ref 7–18)
Bilirubin,Total: 0.4 mg/dL (ref 0.2–1.0)
Calcium, Total: 8.8 mg/dL (ref 8.5–10.1)
Chloride: 112 mmol/L — ABNORMAL HIGH (ref 98–107)
Co2: 25 mmol/L (ref 21–32)
Creatinine: 0.91 mg/dL (ref 0.60–1.30)
EGFR (African American): >60
EGFR (Non-African Amer.): >60
Glucose: 98 mg/dL (ref 65–99)
Osmolality: 284 (ref 275–301)
Potassium: 3.7 mmol/L (ref 3.5–5.1)
SGOT(AST): 22 U/L (ref 15–37)
SGPT (ALT): 24 U/L (ref 14–63)
Sodium: 142 mmol/L (ref 136–145)
Total Protein: 7.9 g/dL (ref 6.4–8.2)

## 2014-03-24 LAB — URINALYSIS, COMPLETE
Bacteria: NONE SEEN
Bilirubin,UR: NEGATIVE
Blood: NEGATIVE
Glucose,UR: NEGATIVE mg/dL (ref 0–75)
Ketone: NEGATIVE
Leukocyte Esterase: NEGATIVE
Nitrite: NEGATIVE
Ph: 7 (ref 4.5–8.0)
Protein: NEGATIVE
RBC,UR: <1 /HPF (ref 0–5)
Specific Gravity: 1.016 (ref 1.003–1.030)
Squamous Epithelial: <1
WBC UR: NONE SEEN /HPF (ref 0–5)

## 2014-03-24 LAB — DRUG SCREEN, URINE
Amphetamines, Ur Screen: NEGATIVE (ref ?–1000)
Barbiturates, Ur Screen: NEGATIVE (ref ?–200)
Benzodiazepine, Ur Scrn: NEGATIVE (ref ?–200)
Cannabinoid 50 Ng, Ur ~~LOC~~: POSITIVE (ref ?–50)
Cocaine Metabolite,Ur ~~LOC~~: NEGATIVE (ref ?–300)
MDMA (Ecstasy)Ur Screen: POSITIVE (ref ?–500)
Methadone, Ur Screen: NEGATIVE (ref ?–300)
Opiate, Ur Screen: NEGATIVE (ref ?–300)
Phencyclidine (PCP) Ur S: NEGATIVE (ref ?–25)
Tricyclic, Ur Screen: NEGATIVE (ref ?–1000)

## 2014-03-24 LAB — SALICYLATE LEVEL: Salicylates, Serum: 4.4 mg/dL — ABNORMAL HIGH

## 2014-03-24 LAB — ETHANOL: Ethanol: <3 mg/dL

## 2014-03-24 LAB — ACETAMINOPHEN LEVEL: Acetaminophen: <2 ug/mL

## 2014-03-26 ENCOUNTER — Inpatient Hospital Stay: Payer: Self-pay | Admitting: Psychiatry
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
## 2014-07-24 ENCOUNTER — Emergency Department (HOSPITAL_COMMUNITY)
Admission: EM | Admit: 2014-07-24 | Discharge: 2014-07-24 | Disposition: A | Discharge status: Home / Self Care | Payer: Medicare Other | Attending: Emergency Medicine | Admitting: Emergency Medicine

## 2014-07-24 ENCOUNTER — Emergency Department (HOSPITAL_COMMUNITY): Payer: Medicare Other

## 2014-07-24 ENCOUNTER — Encounter (HOSPITAL_COMMUNITY): Payer: Self-pay | Admitting: Emergency Medicine

## 2014-07-24 DIAGNOSIS — R079 Chest pain, unspecified: Secondary | ICD-10-CM | POA: Diagnosis present

## 2014-07-24 DIAGNOSIS — Z8739 Personal history of other diseases of the musculoskeletal system and connective tissue: Secondary | ICD-10-CM | POA: Diagnosis not present

## 2014-07-24 DIAGNOSIS — Z79899 Other long term (current) drug therapy: Secondary | ICD-10-CM | POA: Insufficient documentation

## 2014-07-24 DIAGNOSIS — R0789 Other chest pain: Secondary | ICD-10-CM | POA: Insufficient documentation

## 2014-07-24 DIAGNOSIS — R110 Nausea: Secondary | ICD-10-CM | POA: Diagnosis not present

## 2014-07-24 DIAGNOSIS — I10 Essential (primary) hypertension: Secondary | ICD-10-CM | POA: Insufficient documentation

## 2014-07-24 DIAGNOSIS — Z720 Tobacco use: Secondary | ICD-10-CM | POA: Diagnosis not present

## 2014-07-24 LAB — COMPREHENSIVE METABOLIC PANEL
ALT: 23 U/L (ref 14–54)
AST: 21 U/L (ref 15–41)
Albumin: 3.8 g/dL (ref 3.5–5.0)
Alkaline Phosphatase: 90 U/L (ref 38–126)
Anion gap: 9 (ref 5–15)
BUN: 9 mg/dL (ref 6–20)
CO2: 25 mmol/L (ref 22–32)
Calcium: 8.4 mg/dL — ABNORMAL LOW (ref 8.9–10.3)
Chloride: 105 mmol/L (ref 101–111)
Creatinine, Ser: 0.74 mg/dL (ref 0.44–1.00)
GFR calc Af Amer: >60 mL/min (ref >60)
GFR calc non Af Amer: >60 mL/min (ref >60)
Glucose, Bld: 135 mg/dL — ABNORMAL HIGH (ref 65–99)
Potassium: 3.9 mmol/L (ref 3.5–5.1)
Sodium: 139 mmol/L (ref 135–145)
Total Bilirubin: 0.6 mg/dL (ref 0.3–1.2)
Total Protein: 6.8 g/dL (ref 6.5–8.1)

## 2014-07-24 LAB — D-DIMER, QUANTITATIVE: D-Dimer, Quant: 0.74 ug/mL-FEU — ABNORMAL HIGH (ref 0.00–0.48)

## 2014-07-24 LAB — TROPONIN I
Troponin I: <0.03 ng/mL (ref <0.031)
Troponin I: <0.03 ng/mL (ref <0.031)

## 2014-07-24 LAB — CBC WITH DIFFERENTIAL/PLATELET
Basophils Absolute: 0 10*3/uL (ref 0.0–0.1)
Basophils Relative: 0 % (ref 0–1)
Eosinophils Absolute: 0.1 10*3/uL (ref 0.0–0.7)
Eosinophils Relative: 2 % (ref 0–5)
HCT: 40.8 % (ref 36.0–46.0)
Hemoglobin: 13.4 g/dL (ref 12.0–15.0)
Lymphocytes Relative: 36 % (ref 12–46)
Lymphs Abs: 1.7 10*3/uL (ref 0.7–4.0)
MCH: 27.8 pg (ref 26.0–34.0)
MCHC: 32.8 g/dL (ref 30.0–36.0)
MCV: 84.6 fL (ref 78.0–100.0)
Monocytes Absolute: 0.3 10*3/uL (ref 0.1–1.0)
Monocytes Relative: 8 % (ref 3–12)
Neutro Abs: 2.5 10*3/uL (ref 1.7–7.7)
Neutrophils Relative %: 54 % (ref 43–77)
Platelets: 340 10*3/uL (ref 150–400)
RBC: 4.82 MIL/uL (ref 3.87–5.11)
RDW: 16 % — ABNORMAL HIGH (ref 11.5–15.5)
WBC: 4.6 10*3/uL (ref 4.0–10.5)

## 2014-07-24 LAB — LIPASE, BLOOD: Lipase: 18 U/L — ABNORMAL LOW (ref 22–51)

## 2014-07-24 MED ORDER — MORPHINE SULFATE 4 MG/ML IJ SOLN
4.0000 mg | Freq: Once | INTRAMUSCULAR | Status: AC
  Administered 2014-07-24: 4 mg via INTRAVENOUS
  Filled 2014-07-24: qty 1

## 2014-07-24 MED ORDER — IOHEXOL 350 MG/ML SOLN
100.0000 mL | Freq: Once | INTRAVENOUS | Status: AC | PRN
  Administered 2014-07-24: 100 mL via INTRAVENOUS

## 2014-07-24 MED ORDER — ONDANSETRON HCL 4 MG/2ML IJ SOLN
4.0000 mg | Freq: Once | INTRAMUSCULAR | Status: AC
  Administered 2014-07-24: 4 mg via INTRAVENOUS
  Filled 2014-07-24: qty 2

## 2014-07-24 MED ORDER — LORAZEPAM 2 MG/ML IJ SOLN
1.0000 mg | Freq: Once | INTRAMUSCULAR | Status: AC
  Administered 2014-07-24: 1 mg via INTRAVENOUS
  Filled 2014-07-24: qty 1

## 2014-07-24 MED ORDER — IPRATROPIUM-ALBUTEROL 0.5-2.5 (3) MG/3ML IN SOLN
3.0000 mL | Freq: Once | RESPIRATORY_TRACT | Status: AC
  Administered 2014-07-24: 3 mL via RESPIRATORY_TRACT
  Filled 2014-07-24: qty 3

## 2014-07-24 MED ORDER — KETOROLAC TROMETHAMINE 30 MG/ML IJ SOLN
30.0000 mg | Freq: Once | INTRAMUSCULAR | Status: AC
  Administered 2014-07-24: 30 mg via INTRAVENOUS
  Filled 2014-07-24: qty 1

## 2014-07-24 MED ORDER — NITROGLYCERIN 0.4 MG SL SUBL
0.4000 mg | SUBLINGUAL_TABLET | SUBLINGUAL | Status: DC | PRN
  Administered 2014-07-24 (×3): 0.4 mg via SUBLINGUAL
  Filled 2014-07-24: qty 1

## 2014-07-24 MED ORDER — GI COCKTAIL ~~LOC~~
30.0000 mL | Freq: Once | ORAL | Status: AC
  Administered 2014-07-24: 30 mL via ORAL
  Filled 2014-07-24: qty 30

## 2014-07-24 MED ORDER — SODIUM CHLORIDE 0.9 % IV BOLUS (SEPSIS)
500.0000 mL | Freq: Once | INTRAVENOUS | Status: AC
  Administered 2014-07-24: 500 mL via INTRAVENOUS

## 2014-07-24 MED ORDER — ASPIRIN 81 MG PO CHEW
324.0000 mg | CHEWABLE_TABLET | Freq: Once | ORAL | Status: AC
  Administered 2014-07-24: 324 mg via ORAL
  Filled 2014-07-24: qty 4

## 2014-07-24 MED ORDER — HYDROCODONE-ACETAMINOPHEN 5-325 MG PO TABS: 2.0000 | ORAL_TABLET | ORAL | Status: DC | PRN

## 2014-07-24 NOTE — ED Notes (Signed)
Pt states that she was awoken by chest pain about 0500.  States that she is having pain in back and left arm as well.

## 2014-07-24 NOTE — ED Provider Notes (Signed)
CSN: [id number]     Arrival date & time 07/24/14  0725 History   First MD Initiated Contact with Patient 07/24/14 0725     Chief Complaint  Patient presents with  . Chest Pain     (Consider location/radiation/quality/duration/timing/severity/associated sxs/prior Treatment) HPI Comments: Patient presents to the emergency room for evaluation of chest pain. Patient reports that pain began while sleeping, awakening her head for 5 AM. Patient reports a constant, sharp and severe pain in the center of her chest. It radiates to the back when she breathes. She has had mild nausea, no vomiting. There is no diaphoresis. Patient denies abdominal pain.   Past Medical History  Diagnosis Date  . Hypertension   . DDD (degenerative disc disease), lumbar    Past Surgical History  Procedure Laterality Date  . Arm surgery    . Carpal tunnel release Right   . Partial hysterectomy     Family History  Problem Relation Age of Onset  . Cancer Other   . Diabetes Other   . Stroke Other    History  Substance Use Topics  . Smoking status: Current Every Day Smoker -- 0.50 packs/day for 32 years    Types: Cigarettes  . Smokeless tobacco: Never Used  . Alcohol Use: No   OB History    Gravida Para Term Preterm AB TAB SAB Ectopic Multiple Living   Review of Systems  Cardiovascular: Positive for chest pain.  Gastrointestinal: Positive for nausea.  All other systems reviewed and are negative.     Allergies  Adhesive  Home Medications   Prior to Admission medications   Medication Sig Start Date End Date Taking? Authorizing Provider  albuterol (PROVENTIL HFA;VENTOLIN HFA) 108 (90 BASE) MCG/ACT inhaler Inhale 2 puffs into the lungs every 6 (six) hours as needed for wheezing or shortness of breath.   Yes Historical Provider, MD  amLODipine (NORVASC) 10 MG tablet Take 10 mg by mouth daily.   Yes Historical Provider, MD  buPROPion (WELLBUTRIN XL) 300 MG 24 hr tablet Take 1  tablet by mouth daily. 07/14/14  Yes Historical Provider, MD  carbamazepine (TEGRETOL) 200 MG tablet Take 200 mg by mouth 2 (two) times daily. 06/13/14  Yes Historical Provider, MD  cloNIDine (CATAPRES) 0.2 MG tablet Take 0.2 mg by mouth 2 (two) times daily.   Yes Historical Provider, MD  gabapentin (NEURONTIN) 300 MG capsule Take 900 mg by mouth 3 (three) times daily.    Yes Historical Provider, MD  labetalol (NORMODYNE) 200 MG tablet Take 200 mg by mouth 2 (two) times daily.   Yes Historical Provider, MD  oxybutynin (DITROPAN-XL) 10 MG 24 hr tablet Take 10 mg by mouth daily. 07/01/14  Yes Historical Provider, MD  pregabalin (LYRICA) 150 MG capsule Take 300 mg by mouth 2 (two) times daily.    Yes Historical Provider, MD  QUEtiapine (SEROQUEL) 300 MG tablet Take 300 mg by mouth at bedtime.  06/13/14  Yes Historical Provider, MD  QUEtiapine (SEROQUEL) 50 MG tablet Take 50 mg by mouth 3 (three) times daily. 06/13/14  Yes Historical Provider, MD  chlorpheniramine-HYDROcodone (TUSSIONEX PENNKINETIC ER) 10-8 MG/5ML LQCR Take 5 mLs by mouth every 12 (twelve) hours as needed. Patient not taking: Reported on 01/30/2014 05/24/11   Dione Booze, MD  cyclobenzaprine (FLEXERIL) 5 MG tablet Take 1 tablet (5 mg total) by mouth 3 (three) times daily as needed for muscle spasms. Patient not taking:  Reported on 01/30/2014 10/12/13   Burgess Amor, PA-C  HYDROcodone-acetaminophen (NORCO/VICODIN) 5-325 MG per tablet Take 2 tablets by mouth every 4 (four) hours as needed for moderate pain. 07/24/14   Gilda Crease, MD  methocarbamol (ROBAXIN) 500 MG tablet Take 1 tablet (500 mg total) by mouth 2 (two) times daily. Patient not taking: Reported on 07/24/2014 01/30/14   Lorre Nick, MD  naproxen (NAPROSYN) 500 MG tablet Take 1 tablet (500 mg total) by mouth 2 (two) times daily. Patient not taking: Reported on 07/24/2014 01/30/14   Lorre Nick, MD  predniSONE (DELTASONE) 10 MG tablet Take 2 tablets (20 mg total) by mouth  daily. Patient not taking: Reported on 01/30/2014 05/24/11   Dione Booze, MD  predniSONE (DELTASONE) 10 MG tablet 6, 5, 4, 3, 2 then 1 tablet by mouth daily for 6 days total. Patient not taking: Reported on 01/30/2014 10/12/13   Burgess Amor, PA-C   BP 161/106 mmHg  Pulse 98  Temp(Src) 97.9 F (36.6 C) (Oral)  Resp 22  Ht  (1.626 m)  Wt 245 lb (111.131 kg)  BMI 42.03 kg/m2  SpO2 98% Physical Exam  Constitutional: She is oriented to person, place, and time. She appears well-developed and well-nourished. She appears distressed.  HENT:  Head: Normocephalic and atraumatic.  Right Ear: Hearing normal.  Left Ear: Hearing normal.  Nose: Nose normal.  Mouth/Throat: Oropharynx is clear and moist and mucous membranes are normal.  Eyes: Conjunctivae and EOM are normal. Pupils are equal, round, and reactive to light.  Neck: Normal range of motion. Neck supple.  Cardiovascular: Regular rhythm, S1 normal and S2 normal.  Exam reveals no gallop and no friction rub.   No murmur heard. Pulmonary/Chest: Effort normal and breath sounds normal. No respiratory distress. She exhibits tenderness.    Abdominal: Soft. Normal appearance and bowel sounds are normal. There is no hepatosplenomegaly. There is no tenderness. There is no rebound, no guarding, no tenderness at McBurney's point and negative Murphy's sign. No hernia.  Musculoskeletal: Normal range of motion.  Neurological: She is alert and oriented to person, place, and time. She has normal strength. No cranial nerve deficit or sensory deficit. Coordination normal. GCS eye subscore is 4. GCS verbal subscore is 5. GCS motor subscore is 6.  Skin: Skin is warm, dry and intact. No rash noted. No cyanosis.  Psychiatric: She has a normal mood and affect. Her speech is normal and behavior is normal. Thought content normal.  Nursing note and vitals reviewed.   ED Course  Procedures (including critical care time) Labs Review Labs Reviewed  CBC WITH  DIFFERENTIAL/PLATELET - Abnormal; Notable for the following:    RDW 16.0 (*)    All other components within normal limits  COMPREHENSIVE METABOLIC PANEL - Abnormal; Notable for the following:    Glucose, Bld 135 (*)    Calcium 8.4 (*)    All other components within normal limits  LIPASE, BLOOD - Abnormal; Notable for the following:    Lipase 18 (*)    All other components within normal limits  D-DIMER, QUANTITATIVE (NOT AT [facility]) - Abnormal; Notable for the following:    D-Dimer, Quant 0.74 (*)    All other components within normal limits  TROPONIN I  TROPONIN I    Imaging Review Ct Angio Chest Pe W/cm &/or Wo Cm  07/24/2014   CLINICAL DATA:  Chest pain beginning today at 5 a.m.  EXAM: CT ANGIOGRAPHY CHEST WITH CONTRAST  TECHNIQUE: Multidetector CT imaging of the  chest was performed using the standard protocol during bolus administration of intravenous contrast. Multiplanar CT image reconstructions and MIPs were obtained to evaluate the vascular anatomy.  CONTRAST:  OMNIPAQUE IOHEXOL 350 MG/ML SOLN  COMPARISON:  One-view chest x-ray from the same day.  FINDINGS: Pulmonary arterial opacification is satisfactory. The study is mildly degraded by patient motion. No significant filling defects are present to suggest pulmonary emboli.  The heart size is normal. No significant mediastinal or axillary adenopathy is present. 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 No solid organ lesions are present.  The lung windows demonstrate mild dependent atelectasis bilaterally. No other focal nodule, mass, or airspace disease is present.  Review of the MIP images confirms the above findings.  IMPRESSION: 1. No evidence for pulmonary embolus. 2. Sensitivity is slightly degraded by patient motion. 3. Mild dependent atelectasis without other focal airspace disease.   Electronically Signed   By: Marin Roberts M.D.   On: 07/24/2014  10:53   Dg Chest Port 1 View  07/24/2014   CLINICAL DATA:  Chest pain  EXAM: PORTABLE CHEST - 1 VIEW  COMPARISON:  01/30/2014  FINDINGS: Normal cardiac silhouette. There are low lung volumes. There is mild atelectasis versus airspace disease in the right lower lobe. No pneumothorax.  IMPRESSION: Mild atelectasis versus airspace disease in the right lower lobe. Favor atelectasis.   Electronically Signed   By: Genevive Bi M.D.   On: 07/24/2014 08:17     EKG Interpretation   Date/Time:  Sunday July 24 2014 07:30:09 EDT Ventricular Rate:  86 PR Interval:  150 QRS Duration: 83 QT Interval:  398 QTC Calculation: 476 R Axis:   -5 Text Interpretation:  Sinus rhythm Normal ECG Confirmed by POLLINA  MD,  CHRISTOPHER [phone]) on 07/24/2014 7:31:48 AM      MDM   Final diagnoses:  Chest pain   Patient presents to the emergency department for evaluation of chest pain. Patient reports being awakened from sleep at 5 AM. Patient having a sharp pain in her chest that radiates into her back when she takes a deep breath. She does have a history of asthma. She had decreased breath sounds but no significant wheezing upon arrival. Chest x-ray did not show any evidence of pneumonia. Initial cardiac evaluation was negative. She has a normal EKG, negative troponin. BNP was slightly elevated, CT angiography, however, did not show any evidence of PE or aortic dissection. Second troponin was ordered and is negative. Patient was initially treated with nitroglycerin and had no improvement. Secondary treatment with morphine, GI cocktail, albuterol seem to help. Symptoms likely inflammatory and pulmonary in origin. Patient is felt to be low likelihood for cardiac etiology, despite some cardiac risk factors. With her to trip phone and set are negative, she has felt safe for discharge and follow-up with primary doctor.    Gilda Crease, MD 07/24/14 1330

## 2014-07-24 NOTE — Discharge Instructions (Signed)

## 2014-07-24 NOTE — ED Notes (Signed)
RT called for neb tx.

## 2014-09-28 ENCOUNTER — Emergency Department (HOSPITAL_COMMUNITY): Payer: Medicare Other

## 2014-09-28 ENCOUNTER — Inpatient Hospital Stay (HOSPITAL_COMMUNITY)
Admission: EM | Admit: 2014-09-28 | Discharge: 2014-10-01 | DRG: 191 | Disposition: A | Discharge status: Home / Self Care | Payer: Medicare Other | Attending: Family Medicine | Admitting: Family Medicine

## 2014-09-28 ENCOUNTER — Encounter (HOSPITAL_COMMUNITY): Payer: Self-pay | Admitting: Emergency Medicine

## 2014-09-28 DIAGNOSIS — R0602 Shortness of breath: Secondary | ICD-10-CM | POA: Diagnosis not present

## 2014-09-28 DIAGNOSIS — J441 Chronic obstructive pulmonary disease with (acute) exacerbation: Secondary | ICD-10-CM | POA: Diagnosis not present

## 2014-09-28 DIAGNOSIS — T380X5A Adverse effect of glucocorticoids and synthetic analogues, initial encounter: Secondary | ICD-10-CM | POA: Diagnosis present

## 2014-09-28 DIAGNOSIS — I10 Essential (primary) hypertension: Secondary | ICD-10-CM | POA: Diagnosis present

## 2014-09-28 DIAGNOSIS — M5136 Other intervertebral disc degeneration, lumbar region: Secondary | ICD-10-CM | POA: Diagnosis present

## 2014-09-28 DIAGNOSIS — F17200 Nicotine dependence, unspecified, uncomplicated: Secondary | ICD-10-CM | POA: Diagnosis present

## 2014-09-28 DIAGNOSIS — Z7982 Long term (current) use of aspirin: Secondary | ICD-10-CM

## 2014-09-28 DIAGNOSIS — J209 Acute bronchitis, unspecified: Secondary | ICD-10-CM

## 2014-09-28 DIAGNOSIS — Z833 Family history of diabetes mellitus: Secondary | ICD-10-CM

## 2014-09-28 DIAGNOSIS — Z823 Family history of stroke: Secondary | ICD-10-CM

## 2014-09-28 DIAGNOSIS — R739 Hyperglycemia, unspecified: Secondary | ICD-10-CM | POA: Diagnosis present

## 2014-09-28 DIAGNOSIS — R000 Tachycardia, unspecified: Secondary | ICD-10-CM | POA: Diagnosis present

## 2014-09-28 DIAGNOSIS — F17213 Nicotine dependence, cigarettes, with withdrawal: Secondary | ICD-10-CM | POA: Diagnosis present

## 2014-09-28 DIAGNOSIS — R079 Chest pain, unspecified: Secondary | ICD-10-CM | POA: Diagnosis present

## 2014-09-28 LAB — TROPONIN I
Troponin I: <0.03 ng/mL (ref <0.031)
Troponin I: <0.03 ng/mL (ref <0.031)
Troponin I: <0.03 ng/mL (ref <0.031)

## 2014-09-28 LAB — CBC WITH DIFFERENTIAL/PLATELET
Basophils Absolute: 0 10*3/uL (ref 0.0–0.1)
Basophils Relative: 0 % (ref 0–1)
Eosinophils Absolute: 0.1 10*3/uL (ref 0.0–0.7)
Eosinophils Relative: 1 % (ref 0–5)
HCT: 37.1 % (ref 36.0–46.0)
Hemoglobin: 12.7 g/dL (ref 12.0–15.0)
Lymphocytes Relative: 29 % (ref 12–46)
Lymphs Abs: 2.4 10*3/uL (ref 0.7–4.0)
MCH: 27.5 pg (ref 26.0–34.0)
MCHC: 34.2 g/dL (ref 30.0–36.0)
MCV: 80.5 fL (ref 78.0–100.0)
Monocytes Absolute: 0.5 10*3/uL (ref 0.1–1.0)
Monocytes Relative: 6 % (ref 3–12)
Neutro Abs: 5.4 10*3/uL (ref 1.7–7.7)
Neutrophils Relative %: 64 % (ref 43–77)
Platelets: 339 10*3/uL (ref 150–400)
RBC: 4.61 MIL/uL (ref 3.87–5.11)
RDW: 15.4 % (ref 11.5–15.5)
WBC: 8.5 10*3/uL (ref 4.0–10.5)

## 2014-09-28 LAB — COMPREHENSIVE METABOLIC PANEL
ALT: 29 U/L (ref 14–54)
AST: 27 U/L (ref 15–41)
Albumin: 3.9 g/dL (ref 3.5–5.0)
Alkaline Phosphatase: 85 U/L (ref 38–126)
Anion gap: 9 (ref 5–15)
BUN: 14 mg/dL (ref 6–20)
CO2: 20 mmol/L — ABNORMAL LOW (ref 22–32)
Calcium: 8.6 mg/dL — ABNORMAL LOW (ref 8.9–10.3)
Chloride: 111 mmol/L (ref 101–111)
Creatinine, Ser: 0.89 mg/dL (ref 0.44–1.00)
GFR calc Af Amer: >60 mL/min (ref >60)
GFR calc non Af Amer: >60 mL/min (ref >60)
Glucose, Bld: 127 mg/dL — ABNORMAL HIGH (ref 65–99)
Potassium: 3.5 mmol/L (ref 3.5–5.1)
Sodium: 140 mmol/L (ref 135–145)
Total Bilirubin: 0.8 mg/dL (ref 0.3–1.2)
Total Protein: 7.3 g/dL (ref 6.5–8.1)

## 2014-09-28 LAB — TSH: TSH: 0.719 u[IU]/mL (ref 0.350–4.500)

## 2014-09-28 MED ORDER — LEVOFLOXACIN 750 MG PO TABS
750.0000 mg | ORAL_TABLET | Freq: Every day | ORAL | Status: DC
  Administered 2014-09-28 – 2014-10-01 (×4): 750 mg via ORAL
  Filled 2014-09-28 (×4): qty 1

## 2014-09-28 MED ORDER — ONDANSETRON HCL 4 MG PO TABS
4.0000 mg | ORAL_TABLET | Freq: Four times a day (QID) | ORAL | Status: DC | PRN
Start: 2014-09-28 — End: 2014-10-01

## 2014-09-28 MED ORDER — SODIUM CHLORIDE 0.9 % IJ SOLN
3.0000 mL | Freq: Two times a day (BID) | INTRAMUSCULAR | Status: DC
  Administered 2014-09-28 – 2014-10-01 (×5): 3 mL via INTRAVENOUS

## 2014-09-28 MED ORDER — ONDANSETRON HCL 4 MG/2ML IJ SOLN: 4.0000 mg | Freq: Four times a day (QID) | INTRAMUSCULAR | Status: DC | PRN

## 2014-09-28 MED ORDER — CLONIDINE HCL 0.2 MG PO TABS
0.2000 mg | ORAL_TABLET | Freq: Two times a day (BID) | ORAL | Status: DC
  Administered 2014-09-28 – 2014-10-01 (×7): 0.2 mg via ORAL
  Filled 2014-09-28 (×7): qty 1

## 2014-09-28 MED ORDER — DOXYCYCLINE HYCLATE 100 MG IV SOLR
100.0000 mg | Freq: Once | INTRAVENOUS | Status: AC
  Administered 2014-09-28: 100 mg via INTRAVENOUS
  Filled 2014-09-28: qty 100

## 2014-09-28 MED ORDER — ACETAMINOPHEN 325 MG PO TABS
650.0000 mg | ORAL_TABLET | Freq: Four times a day (QID) | ORAL | Status: DC | PRN
  Administered 2014-09-29 (×2): 650 mg via ORAL
  Filled 2014-09-28 (×2): qty 2

## 2014-09-28 MED ORDER — LABETALOL HCL 200 MG PO TABS
200.0000 mg | ORAL_TABLET | Freq: Two times a day (BID) | ORAL | Status: DC
  Administered 2014-09-28 – 2014-09-29 (×3): 200 mg via ORAL
  Filled 2014-09-28 (×2): qty 1

## 2014-09-28 MED ORDER — ASPIRIN EC 81 MG PO TBEC
81.0000 mg | DELAYED_RELEASE_TABLET | Freq: Every day | ORAL | Status: DC
  Administered 2014-09-29 – 2014-10-01 (×3): 81 mg via ORAL
  Filled 2014-09-28 (×3): qty 1

## 2014-09-28 MED ORDER — IPRATROPIUM BROMIDE 0.02 % IN SOLN
0.5000 mg | Freq: Once | RESPIRATORY_TRACT | Status: AC
Start: 2014-09-28 — End: 2014-09-28
  Administered 2014-09-28: 0.5 mg via RESPIRATORY_TRACT

## 2014-09-28 MED ORDER — SODIUM CHLORIDE 0.9 % IJ SOLN: 3.0000 mL | INTRAMUSCULAR | Status: DC | PRN

## 2014-09-28 MED ORDER — SODIUM CHLORIDE 0.9 % IJ SOLN
3.0000 mL | Freq: Two times a day (BID) | INTRAMUSCULAR | Status: DC
  Administered 2014-09-28 – 2014-09-30 (×3): 3 mL via INTRAVENOUS

## 2014-09-28 MED ORDER — DOXYCYCLINE HYCLATE 100 MG IV SOLR
INTRAVENOUS | Status: AC
  Filled 2014-09-28: qty 100

## 2014-09-28 MED ORDER — POLYETHYLENE GLYCOL 3350 17 G PO PACK
17.0000 g | PACK | Freq: Every day | ORAL | Status: DC | PRN
  Administered 2014-09-30: 17 g via ORAL
  Filled 2014-09-28: qty 1

## 2014-09-28 MED ORDER — METHYLPREDNISOLONE SODIUM SUCC 125 MG IJ SOLR
125.0000 mg | Freq: Once | INTRAMUSCULAR | Status: AC
  Administered 2014-09-28: 125 mg via INTRAVENOUS

## 2014-09-28 MED ORDER — HYDROCODONE-ACETAMINOPHEN 5-325 MG PO TABS
2.0000 | ORAL_TABLET | ORAL | Status: DC | PRN
  Administered 2014-10-01 (×2): 2 via ORAL
  Filled 2014-09-28 (×2): qty 2

## 2014-09-28 MED ORDER — GUAIFENESIN-DM 100-10 MG/5ML PO SYRP: 5.0000 mL | ORAL_SOLUTION | ORAL | Status: DC | PRN

## 2014-09-28 MED ORDER — AMLODIPINE BESYLATE 5 MG PO TABS
10.0000 mg | ORAL_TABLET | Freq: Every day | ORAL | Status: DC
  Administered 2014-09-28 – 2014-10-01 (×4): 10 mg via ORAL
  Filled 2014-09-28 (×4): qty 2

## 2014-09-28 MED ORDER — GABAPENTIN 300 MG PO CAPS
900.0000 mg | ORAL_CAPSULE | Freq: Every day | ORAL | Status: DC | PRN
  Administered 2014-09-29: 900 mg via ORAL
  Filled 2014-09-28: qty 3

## 2014-09-28 MED ORDER — QUETIAPINE FUMARATE 100 MG PO TABS
600.0000 mg | ORAL_TABLET | Freq: Every day | ORAL | Status: DC
Start: 2014-09-28 — End: 2014-10-01
  Administered 2014-09-28 – 2014-09-30 (×3): 600 mg via ORAL
  Filled 2014-09-28 (×3): qty 6

## 2014-09-28 MED ORDER — SODIUM CHLORIDE 0.9 % IV BOLUS (SEPSIS)
1000.0000 mL | Freq: Once | INTRAVENOUS | Status: AC
Start: 2014-09-28 — End: 2014-09-28
  Administered 2014-09-28: 1000 mL via INTRAVENOUS

## 2014-09-28 MED ORDER — METHYLPREDNISOLONE SODIUM SUCC 125 MG IJ SOLR
INTRAMUSCULAR | Status: AC
Start: 2014-09-28 — End: 2014-09-28
  Filled 2014-09-28: qty 2

## 2014-09-28 MED ORDER — PREGABALIN 75 MG PO CAPS
150.0000 mg | ORAL_CAPSULE | Freq: Two times a day (BID) | ORAL | Status: DC
  Administered 2014-09-28 – 2014-10-01 (×7): 150 mg via ORAL
  Filled 2014-09-28 (×7): qty 2

## 2014-09-28 MED ORDER — SODIUM CHLORIDE 0.9 % IV SOLN: 250.0000 mL | INTRAVENOUS | Status: DC | PRN

## 2014-09-28 MED ORDER — NICOTINE 21 MG/24HR TD PT24
21.0000 mg | MEDICATED_PATCH | Freq: Every day | TRANSDERMAL | Status: DC
  Administered 2014-09-28 – 2014-10-01 (×4): 21 mg via TRANSDERMAL
  Filled 2014-09-28 (×4): qty 1

## 2014-09-28 MED ORDER — ALBUTEROL SULFATE (2.5 MG/3ML) 0.083% IN NEBU
INHALATION_SOLUTION | RESPIRATORY_TRACT | Status: AC
  Administered 2014-09-28: 2.5 mg
  Filled 2014-09-28: qty 15

## 2014-09-28 MED ORDER — LORAZEPAM 1 MG PO TABS
1.0000 mg | ORAL_TABLET | Freq: Every day | ORAL | Status: DC
  Administered 2014-09-28 – 2014-09-30 (×3): 1 mg via ORAL
  Filled 2014-09-28 (×3): qty 1

## 2014-09-28 MED ORDER — QUETIAPINE FUMARATE 25 MG PO TABS
50.0000 mg | ORAL_TABLET | Freq: Three times a day (TID) | ORAL | Status: DC
  Administered 2014-09-28 – 2014-10-01 (×13): 50 mg via ORAL
  Filled 2014-09-28 (×13): qty 2

## 2014-09-28 MED ORDER — VARENICLINE TARTRATE 1 MG PO TABS
0.5000 mg | ORAL_TABLET | Freq: Two times a day (BID) | ORAL | Status: DC
  Administered 2014-09-28 – 2014-10-01 (×7): 0.5 mg via ORAL
  Filled 2014-09-28 (×11): qty 1

## 2014-09-28 MED ORDER — ALUM & MAG HYDROXIDE-SIMETH 200-200-20 MG/5ML PO SUSP: 30.0000 mL | Freq: Four times a day (QID) | ORAL | Status: DC | PRN

## 2014-09-28 MED ORDER — IPRATROPIUM-ALBUTEROL 0.5-2.5 (3) MG/3ML IN SOLN
RESPIRATORY_TRACT | Status: AC
  Filled 2014-09-28: qty 3

## 2014-09-28 MED ORDER — METHYLPREDNISOLONE SODIUM SUCC 125 MG IJ SOLR
60.0000 mg | Freq: Four times a day (QID) | INTRAMUSCULAR | Status: DC
  Administered 2014-09-28 – 2014-09-30 (×8): 60 mg via INTRAVENOUS
  Filled 2014-09-28 (×8): qty 2

## 2014-09-28 MED ORDER — SODIUM CHLORIDE 0.9 % IV SOLN
INTRAVENOUS | Status: DC
  Administered 2014-09-28: 05:00:00 via INTRAVENOUS

## 2014-09-28 MED ORDER — MAGNESIUM CITRATE PO SOLN
1.0000 | Freq: Once | ORAL | Status: AC | PRN
  Administered 2014-09-30: 1 via ORAL
  Filled 2014-09-28: qty 296

## 2014-09-28 MED ORDER — ACETAMINOPHEN 650 MG RE SUPP: 650.0000 mg | Freq: Four times a day (QID) | RECTAL | Status: DC | PRN

## 2014-09-28 MED ORDER — ENOXAPARIN SODIUM 40 MG/0.4ML ~~LOC~~ SOLN
40.0000 mg | SUBCUTANEOUS | Status: DC
  Administered 2014-09-28 – 2014-09-30 (×3): 40 mg via SUBCUTANEOUS
  Filled 2014-09-28 (×3): qty 0.4

## 2014-09-28 MED ORDER — ALBUTEROL (5 MG/ML) CONTINUOUS INHALATION SOLN: 15.0000 mg/h | INHALATION_SOLUTION | Freq: Once | RESPIRATORY_TRACT | Status: DC

## 2014-09-28 MED ORDER — LEVALBUTEROL HCL 0.63 MG/3ML IN NEBU
0.6300 mg | INHALATION_SOLUTION | RESPIRATORY_TRACT | Status: DC
  Administered 2014-09-28 – 2014-09-29 (×6): 0.63 mg via RESPIRATORY_TRACT
  Filled 2014-09-28 (×6): qty 3

## 2014-09-28 NOTE — ED Notes (Signed)
AC called for medications. 

## 2014-09-28 NOTE — ED Provider Notes (Signed)
Patient here with increased shortness of breath, has COPD exacerbation based on history and exam. Discussed with Dr. Ardyth Harps who agrees to admit the patient for Dr. Otilio Saber service.  Tilden Fossa, MD 09/28/14 [phone]

## 2014-09-28 NOTE — ED Notes (Signed)
Pt c/o cough and sob.

## 2014-09-28 NOTE — ED Notes (Signed)
Patient ambulated to bathroom with no assistance or difficulty. 
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
## 2014-09-28 NOTE — Progress Notes (Signed)
Initial Nutrition Assessment  DOCUMENTATION CODES:  Morbid obesity  INTERVENTION:  Gave education/encouragement for weight loss/hypertension  NUTRITION DIAGNOSIS:  Inadequate oral intake related to poor appetite as evidenced by patient reporting eating nothing in past 2.5 days.   GOAL:  Patient will meet greater than or equal to 90% of their needs  MONITOR:  PO intake, Labs, Weight trends, I & O's  REASON FOR ASSESSMENT:  Malnutrition Screening Tool    ASSESSMENT:  49 y.o. Female PMHx HTN, , chronic bronchitis, anxiety presents  To ED with worsening shortness of breath and chest pain. Initial evaluation in the ED reveals COPD exacerbation.  Spoke with pt. While I was there she was eating a breakfast sandwich. She states that was the first thing she had in 3 days because of her chest pain. She did not have any appetite.   Her major complaint was as follows: At her Doctors Appointments recently, she saw her weight trending down. From 250 to 240 to 235. She stated the doctor was happy with this. However when she was weighed today she was 244 and this really upset her. She did not know how she could have gained that much weight. She states that she has made some big changes eating/excercise wise since her husband died in front of her from a MI last year. She reports walking now for exercise and no longer doing frozen meals. She uses frozen/fresh vegetables and prepares her own meals. She also says she reads nutrition labels and watches her sodium.   We discussed how hospital weights are never the best weights to use because individuals tend me be holding fluid when they are sick and she had also been given IV fluids.  I also told her that each scale is a little different. I think her weight measurement of 235 is closer to her true weight  I told her that she has to be careful with frozen foods because many items (not just the pre made meals" can contain added sodium. Congratulated her on  reading labels and exercising. Mainly, I helped calm her anxiety surrounding this weight measurement.   Diet Order:  Diet Heart Room service appropriate?: Yes; Fluid consistency:: Thin  Skin:  Reviewed, no issues  Last BM:  8/6  Height:  Ht Readings from Last 1 Encounters:  09/28/14 5\' 4"  (1.626 m)   Weight:  Wt Readings from Last 1 Encounters:  09/28/14 244 lb 4.3 oz (110.8 kg)   Wt Readings from Last 10 Encounters:  09/28/14 244 lb 4.3 oz (110.8 kg)  07/24/14 245 lb (111.131 kg)  01/30/14 215 lb (97.523 kg)  10/12/13 230 lb (104.327 kg)  05/24/11 245 lb (111.131 kg)  06/29/07 237 lb (107.502 kg)  06/15/07 233 lb (105.688 kg)  06/05/07 237 lb (107.502 kg)  05/07/07 241 lb (109.317 kg)  04/02/07 237 lb (107.502 kg)  Admit weight 235.   Ideal Body Weight:  54.54 kg  BMI:  Body mass index is 41.91 kg/(m^2).  Estimated Nutritional Needs:  Kcal:  1400-1500 kcals Protein:  55-65 (1-1.2 g/kg ibw) Fluid:  1.4-1.5 liters  EDUCATION NEEDS:  Education needs addressed  Christophe Louis RD, LDN Nutrition Pager: [phone] 09/28/2014 12:54 PM

## 2014-09-28 NOTE — ED Notes (Signed)
Patient requesting bedside commode. As nurse was bringing commode to patient's room, patient ran from room pushing IV pole to bathroom.

## 2014-09-28 NOTE — ED Notes (Signed)
Patient given meal tray as approved by Hospitalist. Patient sitting upright in bed eating at this time.

## 2014-09-28 NOTE — ED Notes (Addendum)
Patient requesting juice and food. Gave patient grape juice as requested and approved by MD.

## 2014-09-28 NOTE — H&P (Signed)
[facility] History and Physical  [name] WJX:[id number] DOB: [birth year] DOA: 09/28/2014  Referring physician:  PCP: Debra Stalling, MD   Chief Complaint: sob/chest pain  HPI: [name] is a 49 y.o. female has medical history that includes hypertension, degenerative disc disease, chronic bronchitis, anxiety presents emergency department with chief complaint worsening shortness of breath and chest pain. Initial evaluation in the emergency department reveals COPD exacerbation.  She reports 5 days ago she started with mild shortness of breath cough intermittent fever. She states initially she was able to walk without worsening shortness of breath. She took Tylenol for the fever and it improved. She reports her fever was as high as 101. 2 days ago when she was walking she became short of breath and developed chest pain. Pain was located left anterior nonradiating worse with coughing. She has an inhaler at home and she used it without improvement.  Associated symptoms include productive cough subjective fever all herbal wheezing. She denies abdominal pain nausea vomiting headache dizziness syncope or near-syncope. Workup in the emergency department fluids complete blood count that is unremarkable basic metabolic panel unremarkable chest x-ray with mild peribronchial thickening otherwise lungs are clear. Initial troponins negative EKG sinus tachycardia.  In the emergency department she is given Solu-Medrol and albuterol nebulizer. She's also provided with doxycycline.  Time my exam she is afebrile hemodynamically stable and not hypoxic.   Review of Systems:  10 point review of systems complete and all systems are negative as indicated in the history of present illness  Past Medical History  Diagnosis Date  . Hypertension   . DDD (degenerative disc disease), lumbar    Past Surgical History  Procedure Laterality Date  . Arm surgery    . Carpal tunnel release Right    . Partial hysterectomy     Social History:  reports that she has been smoking Cigarettes.  She has a 16 pack-year smoking history. She has never used smokeless tobacco. She reports that she does not drink alcohol or use illicit drugs. She lives alone she is disabled she is independent with ADLs  Allergies  Allergen Reactions  . Adhesive [Tape] Rash    Family History  Problem Relation Age of Onset  . Cancer Other   . Diabetes Other   . Stroke Other      Prior to Admission medications   Medication Sig Start Date End Date Taking? Authorizing Provider  albuterol (PROVENTIL HFA;VENTOLIN HFA) 108 (90 BASE) MCG/ACT inhaler Inhale 2 puffs into the lungs every 6 (six) hours as needed for wheezing or shortness of breath.   Yes Historical Provider, MD  amLODipine (NORVASC) 10 MG tablet Take 10 mg by mouth daily.   Yes Historical Provider, MD  aspirin EC 81 MG tablet Take 81 mg by mouth daily.   Yes Historical Provider, MD  cloNIDine (CATAPRES) 0.2 MG tablet Take 0.2 mg by mouth 2 (two) times daily.   Yes Historical Provider, MD  gabapentin (NEURONTIN) 300 MG capsule Take 900 mg by mouth daily as needed (pain).    Yes Historical Provider, MD  labetalol (NORMODYNE) 200 MG tablet Take 200 mg by mouth 2 (two) times daily.   Yes Historical Provider, MD  LORazepam (ATIVAN) 1 MG tablet Take 1 mg by mouth at bedtime.    Yes Historical Provider, MD  pregabalin (LYRICA) 150 MG capsule Take 150 mg by mouth 2 (two) times daily.    Yes Historical Provider, MD  QUEtiapine (SEROQUEL) 300 MG tablet Take 600  mg by mouth at bedtime.  06/13/14  Yes Historical Provider, MD  QUEtiapine (SEROQUEL) 50 MG tablet Take 50 mg by mouth 4 (four) times daily - after meals and at bedtime.  06/13/14  Yes Historical Provider, MD  HYDROcodone-acetaminophen (NORCO/VICODIN) 5-325 MG per tablet Take 2 tablets by mouth every 4 (four) hours as needed for moderate pain. 07/24/14   Gilda Crease, MD   Physical Exam: Filed Vitals:    09/28/14 0601 09/28/14 0630 09/28/14 0939 09/28/14 0959  BP: 155/96 160/84 176/112 186/111  Pulse:  117  128  Temp:      TempSrc:      Resp: 25 24  19   Height:      Weight:      SpO2:  100% 97% 98%    Wt Readings from Last 3 Encounters:  09/28/14 106.595 kg (235 lb)  07/24/14 111.131 kg (245 lb)  01/30/14 97.523 kg (215 lb)    General:  Appears calm and comfortable Eyes: PERRL, normal lids, irises & conjunctiva ENT: grossly normal hearing, lips & tongue Neck: no LAD, masses or thyromegaly Cardiovascular: Tachycardic, no m/r/g. No LE edema. Respiratory: Mild increased work of breathing with conversation. Breath sounds with fair air flow scattered rhonchi and expiratory wheeze anterior posterior.. Abdomen: soft, ntnd Positive bowel sounds  Skin: no rash or induration seen on limited exam Musculoskeletal: grossly normal tone BUE/BLE Psychiatric: grossly normal mood and affect, speech fluent and appropriate Neurologic: grossly non-focal.          Labs on Admission:  Basic Metabolic Panel:  Recent Labs Lab 09/28/14 0530  NA 140  K 3.5  CL 111  CO2 20*  GLUCOSE 127*  BUN 14  CREATININE 0.89  CALCIUM 8.6*   Liver Function Tests:  Recent Labs Lab 09/28/14 0530  AST 27  ALT 29  ALKPHOS 85  BILITOT 0.8  PROT 7.3  ALBUMIN 3.9   No results for input(s): LIPASE, AMYLASE in the last 168 hours. No results for input(s): AMMONIA in the last 168 hours. CBC:  Recent Labs Lab 09/28/14 0450  WBC 8.5  NEUTROABS 5.4  HGB 12.7  HCT 37.1  MCV 80.5  PLT 339   Cardiac Enzymes:  Recent Labs Lab 09/28/14 0530  TROPONINI <0.03    BNP (last 3 results) No results for input(s): BNP in the last 8760 hours.  ProBNP (last 3 results) No results for input(s): PROBNP in the last 8760 hours.  CBG: No results for input(s): GLUCAP in the last 168 hours.  Radiological Exams on Admission: Dg Chest 2 View  09/28/2014   CLINICAL DATA:  Acute onset of dyspnea on  exertion. Initial encounter.  EXAM: CHEST  2 VIEW  COMPARISON:  Chest radiograph and CTA of the chest performed 07/24/2014  FINDINGS: The lungs are well-aerated. Mild peribronchial thickening is noted. There is no evidence of focal opacification, pleural effusion or pneumothorax.  The heart is normal in size; the mediastinal contour is within normal limits. No acute osseous abnormalities are seen.  IMPRESSION: Mild peribronchial thickening noted; lungs otherwise clear.   Electronically Signed   By: Roanna Raider M.D.   On: 09/28/2014 06:01    EKG:   Assessment/Plan Principal Problem:   COPD with acute exacerbation: Likely bronchitis. Will admit to telemetry. Will provide nebulizers sterile within a biotics. Monitor oxygen saturation level provide oxygen supplementation as indicated. She has not been hypoxic during her time in the emergency department. Active Problems:   Chest pain: Likely pleuritic and related  to frequent coughing. It is reproducible on exam. EKG without acute changes. Initial troponin negative. Will cycle troponin. Monitor on telemetry    Hyperglycemia: Likely related to sterile it. Will plan a hemoglobin A1c. No history of diabetes    Tachycardia: Related to nebulizers. Will change to Xopenex.    TOBACCO ABUSE: Cessation counseling offered    Essential hypertension: Controlled. Home medications include amlodipine Catapres labetalol. I will continue these   Code Status: full DVT Prophylaxis: Family Communication:  Disposition Plan: home hopefully tomorrow  Time spent: 65 minutes  [facility] Pager [phone]

## 2014-09-28 NOTE — ED Notes (Signed)
Pt sleeping while getting a breathing treatment.

## 2014-09-28 NOTE — ED Notes (Signed)
Pt ambulated well, with no assistance needed. o2-99%  Hr-131

## 2014-09-29 LAB — BASIC METABOLIC PANEL
Anion gap: 8 (ref 5–15)
BUN: 14 mg/dL (ref 6–20)
CO2: 24 mmol/L (ref 22–32)
Calcium: 9.7 mg/dL (ref 8.9–10.3)
Chloride: 109 mmol/L (ref 101–111)
Creatinine, Ser: 0.91 mg/dL (ref 0.44–1.00)
GFR calc Af Amer: >60 mL/min (ref >60)
GFR calc non Af Amer: >60 mL/min (ref >60)
Glucose, Bld: 170 mg/dL — ABNORMAL HIGH (ref 65–99)
Potassium: 4.3 mmol/L (ref 3.5–5.1)
Sodium: 141 mmol/L (ref 135–145)

## 2014-09-29 LAB — CBC
HCT: 37.3 % (ref 36.0–46.0)
Hemoglobin: 12.4 g/dL (ref 12.0–15.0)
MCH: 27.3 pg (ref 26.0–34.0)
MCHC: 33.2 g/dL (ref 30.0–36.0)
MCV: 82 fL (ref 78.0–100.0)
Platelets: 384 10*3/uL (ref 150–400)
RBC: 4.55 MIL/uL (ref 3.87–5.11)
RDW: 15.6 % — ABNORMAL HIGH (ref 11.5–15.5)
WBC: 10.7 10*3/uL — ABNORMAL HIGH (ref 4.0–10.5)

## 2014-09-29 LAB — GLUCOSE, CAPILLARY: Glucose-Capillary: 271 mg/dL — ABNORMAL HIGH (ref 65–99)

## 2014-09-29 LAB — HEMOGLOBIN A1C
Hgb A1c MFr Bld: 6.3 % — ABNORMAL HIGH (ref 4.8–5.6)
Mean Plasma Glucose: 134 mg/dL

## 2014-09-29 MED ORDER — AEROCHAMBER Z-STAT PLUS/MEDIUM MISC
1.0000 | Freq: Once | Status: DC
  Filled 2014-09-29: qty 1

## 2014-09-29 MED ORDER — LEVALBUTEROL HCL 0.63 MG/3ML IN NEBU
0.6300 mg | INHALATION_SOLUTION | RESPIRATORY_TRACT | Status: DC | PRN
  Administered 2014-09-29 – 2014-10-01 (×4): 0.63 mg via RESPIRATORY_TRACT
  Filled 2014-09-29 (×3): qty 3

## 2014-09-29 MED ORDER — ZOLPIDEM TARTRATE 5 MG PO TABS
5.0000 mg | ORAL_TABLET | Freq: Every evening | ORAL | Status: DC | PRN
  Administered 2014-09-29: 5 mg via ORAL
  Filled 2014-09-29 (×2): qty 1

## 2014-09-29 MED ORDER — LABETALOL HCL 200 MG PO TABS
300.0000 mg | ORAL_TABLET | Freq: Two times a day (BID) | ORAL | Status: DC
  Administered 2014-09-29 – 2014-10-01 (×4): 300 mg via ORAL
  Filled 2014-09-29 (×4): qty 2

## 2014-09-29 MED ORDER — LEVALBUTEROL HCL 0.63 MG/3ML IN NEBU
0.6300 mg | INHALATION_SOLUTION | Freq: Four times a day (QID) | RESPIRATORY_TRACT | Status: DC
  Administered 2014-09-29 – 2014-10-01 (×8): 0.63 mg via RESPIRATORY_TRACT
  Filled 2014-09-29 (×9): qty 3

## 2014-09-29 NOTE — Progress Notes (Signed)
Patient much,calmer on nicotine dermal patch. Lungs show moderate improvement in his return x-ray wheezing still with scattered rhonchi will continue IV Solu-Medrol likewise on Chantix and nicotine patch blood pressure still elevated [name] [id number] DOB: December 27, 1965 DOA: 09/28/2014 PCP: Debra Stalling, MD             Physical Exam: Blood pressure 166/99, pulse 99, temperature 98.2 F (36.8 C), temperature source Oral, resp. rate 20, height 5\' 4"  (1.626 m), weight 244 lb 4.3 oz (110.8 kg), SpO2 100 %. lungs show scattered coarse rhonchi mild/moderate to mild intrauterine external wheezing bilaterally no rales appreciated. Heart regular rhythm no*when I he feels rubs   Investigations:  No results found for this or any previous visit (from the past 240 hour(s)).   Basic Metabolic Panel:  Recent Labs  12/13/83 0530 09/29/14 0651  NA 140 141  K 3.5 4.3  CL 111 109  CO2 20* 24  GLUCOSE 127* 170*  BUN 14 14  CREATININE 0.89 0.91  CALCIUM 8.6* 9.7   Liver Function Tests:  Recent Labs  09/28/14 0530  AST 27  ALT 29  ALKPHOS 85  BILITOT 0.8  PROT 7.3  ALBUMIN 3.9     CBC:  Recent Labs  09/28/14 0450 09/29/14 0651  WBC 8.5 10.7*  NEUTROABS 5.4  --   HGB 12.7 12.4  HCT 37.1 37.3  MCV 80.5 82.0  PLT 339 384    Dg Chest 2 View  09/28/2014   CLINICAL DATA:  Acute onset of dyspnea on exertion. Initial encounter.  EXAM: CHEST  2 VIEW  COMPARISON:  Chest radiograph and CTA of the chest performed 07/24/2014  FINDINGS: The lungs are well-aerated. Mild peribronchial thickening is noted. There is no evidence of focal opacification, pleural effusion or pneumothorax.  The heart is normal in size; the mediastinal contour is within normal limits. No acute osseous abnormalities are seen.  IMPRESSION: Mild peribronchial thickening noted; lungs otherwise clear.   Electronically Signed   By: Roanna Raider M.D.   On: 09/28/2014 06:01       Medications:  Impression:  Principal Problem:   COPD with acute exacerbation Active Problems:   TOBACCO ABUSE   Essential hypertension   Chest pain   Hyperglycemia   Tachycardia   COPD exacerbation     Plan: Increase antihypertensives therapy continues Chantix IV Solu-Medrol and nebulizer therapy  Consultants:    Procedures   Antibiotics:                   Code Status: Full   Family Communication:  Spoke at length with patient  Disposition Plan see plan above  Time spent: 30 minutes   LOS: 1 day   [name] M   09/29/2014, 12:44 PM

## 2014-09-30 DIAGNOSIS — T380X5A Adverse effect of glucocorticoids and synthetic analogues, initial encounter: Secondary | ICD-10-CM | POA: Diagnosis present

## 2014-09-30 DIAGNOSIS — J441 Chronic obstructive pulmonary disease with (acute) exacerbation: Secondary | ICD-10-CM | POA: Diagnosis present

## 2014-09-30 DIAGNOSIS — M5136 Other intervertebral disc degeneration, lumbar region: Secondary | ICD-10-CM | POA: Diagnosis present

## 2014-09-30 DIAGNOSIS — R739 Hyperglycemia, unspecified: Secondary | ICD-10-CM | POA: Diagnosis present

## 2014-09-30 DIAGNOSIS — R000 Tachycardia, unspecified: Secondary | ICD-10-CM | POA: Diagnosis present

## 2014-09-30 DIAGNOSIS — R0602 Shortness of breath: Secondary | ICD-10-CM | POA: Diagnosis present

## 2014-09-30 DIAGNOSIS — Z833 Family history of diabetes mellitus: Secondary | ICD-10-CM | POA: Diagnosis not present

## 2014-09-30 DIAGNOSIS — Z823 Family history of stroke: Secondary | ICD-10-CM | POA: Diagnosis not present

## 2014-09-30 DIAGNOSIS — F17213 Nicotine dependence, cigarettes, with withdrawal: Secondary | ICD-10-CM | POA: Diagnosis present

## 2014-09-30 DIAGNOSIS — Z7982 Long term (current) use of aspirin: Secondary | ICD-10-CM | POA: Diagnosis not present

## 2014-09-30 DIAGNOSIS — I10 Essential (primary) hypertension: Secondary | ICD-10-CM | POA: Diagnosis present

## 2014-09-30 LAB — GLUCOSE, CAPILLARY: Glucose-Capillary: 258 mg/dL — ABNORMAL HIGH (ref 65–99)

## 2014-09-30 MED ORDER — GABAPENTIN 300 MG PO CAPS
900.0000 mg | ORAL_CAPSULE | Freq: Three times a day (TID) | ORAL | Status: DC
  Administered 2014-09-30 – 2014-10-01 (×4): 900 mg via ORAL
  Filled 2014-09-30 (×4): qty 3

## 2014-09-30 MED ORDER — METHYLPREDNISOLONE SODIUM SUCC 125 MG IJ SOLR
80.0000 mg | Freq: Four times a day (QID) | INTRAMUSCULAR | Status: DC
  Administered 2014-09-30 – 2014-10-01 (×5): 80 mg via INTRAVENOUS
  Filled 2014-09-30 (×5): qty 2

## 2014-09-30 NOTE — Progress Notes (Signed)
64 Spoke with Dr.DonDiego regarding patient's request for her Gabapentin to be ordered like she takes at home (per psychiatrist order). New order given for Gabapentin 900mg  PO TID for pain. Patient made aware.

## 2014-09-30 NOTE — Progress Notes (Signed)
Patient states that she takes gabapentin 900mg  three times a day. Per med rec it only states she takes 900mg  daily as needed. MD notified and stated that the dosage was too high and he would not order any more gabapentin at this time. Patient notified and she became upset and stated she will get her son to bring the medicine from home and take it. Supervisor notified. Will continue to monitor.

## 2014-09-30 NOTE — Progress Notes (Signed)
Kynadee Mcglathery OVA:[id number] DOB: 1965/04/05 DOA: 09/28/2014 PCP: Isabella Stalling, MD             Physical Exam: Blood pressure 162/87, pulse 102, temperature 98.5 F (36.9 C), temperature source Oral, resp. rate 20, height 5\' 4"  (1.626 m), weight 244 lb 4.3 oz (110.8 kg), SpO2 98 %. no JVD no carotid bruits no thyromegaly lungs show mild to moderate expiratory wheeze scattered rhonchi no rales appreciable heart regular rhythm no S3-S4 no heaves thrills rubs abdomen soft nontender bowel sounds normoactive   Investigations:  No results found for this or any previous visit (from the past 240 hour(s)).   Basic Metabolic Panel:  Recent Labs  04/59/97 0530 09/29/14 0651  NA 140 141  K 3.5 4.3  CL 111 109  CO2 20* 24  GLUCOSE 127* 170*  BUN 14 14  CREATININE 0.89 0.91  CALCIUM 8.6* 9.7   Liver Function Tests:  Recent Labs  09/28/14 0530  AST 27  ALT 29  ALKPHOS 85  BILITOT 0.8  PROT 7.3  ALBUMIN 3.9     CBC:  Recent Labs  09/28/14 0450 09/29/14 0651  WBC 8.5 10.7*  NEUTROABS 5.4  --   HGB 12.7 12.4  HCT 37.1 37.3  MCV 80.5 82.0  PLT 339 384    No results found.    Medications:   Impression:  Principal Problem:   COPD with acute exacerbation Active Problems:   TOBACCO ABUSE   Essential hypertension   Chest pain   Hyperglycemia   Tachycardia   COPD exacerbation     Plan: Decrease Solu-Medrol to 80 IV every 8 hours continue nebulizer therapy  Consultants:   Procedures   Antibiotics: Levaquin 750 daily                  Code Status: Full  Family Communication:    Disposition Plan see plan above  Time spent: 30 minutes   LOS: 2 days   Aadam Zhen M   09/30/2014, 10:59 AM

## 2014-09-30 NOTE — Care Management Important Message (Signed)
Important Message  Patient Details  Name: [name] MRN: [id number] Date of Birth: 04-14-1965   Medicare Important Message Given:  Yes-second notification given    Malcolm Metro, RN 09/30/2014, 2:56 PM

## 2014-09-30 NOTE — Progress Notes (Signed)
Patient reported that she took 900mg  gabapentin from home.

## 2014-09-30 NOTE — Progress Notes (Signed)
1335 Nursing secretary reported that security noted patient outside facility asking people for cigarettes. Patient educated by this Clinical research associate of the facility/campus non-smoking rule and that we do not allow people to smoke while admitted on the floor. Patient understood and said it wouldn't happen again.

## 2014-10-01 MED ORDER — AEROCHAMBER Z-STAT PLUS/MEDIUM MISC: Status: DC

## 2014-10-01 MED ORDER — ALBUTEROL SULFATE HFA 108 (90 BASE) MCG/ACT IN AERS: 2.0000 | INHALATION_SPRAY | Freq: Four times a day (QID) | RESPIRATORY_TRACT | Status: DC | PRN

## 2014-10-01 MED ORDER — PREDNISONE 20 MG PO TABS: ORAL_TABLET | ORAL | Status: DC

## 2014-10-01 MED ORDER — VARENICLINE TARTRATE 0.5 MG PO TABS: 0.5000 mg | ORAL_TABLET | Freq: Two times a day (BID) | ORAL | Status: DC

## 2014-10-01 MED ORDER — NICOTINE 21 MG/24HR TD PT24: 21.0000 mg | MEDICATED_PATCH | Freq: Every day | TRANSDERMAL | Status: DC

## 2014-10-01 NOTE — Discharge Summary (Signed)
Physician Discharge Summary  [name] [id number] DOB: 1965-09-19 DOA: 09/28/2014  PCP: Isabella Stalling, MD  Admit date: 09/28/2014 Discharge date: 10/01/2014   Recommendations for Outpatient Follow-up:  Patient will follow my office in 2-3 days time to assess airway reactivity and bronchospasm she is to take all medicines to avoid cigarettes and to take Chantix 1 tablet twice a day as well as her nicotine patch one a day Discharge Diagnoses:  Principal Problem:   COPD with acute exacerbation Active Problems:   TOBACCO ABUSE   Essential hypertension   Chest pain   Hyperglycemia   Tachycardia   COPD exacerbation   Discharge Condition: Stable and improving  Filed Weights   09/28/14 0433 09/28/14 1142  Weight: 235 lb (106.595 kg) 244 lb 4.3 oz (110.8 kg)    History of present illness:  Patient with a history of one pack per day smoking was admitted with the exacerbation of dyspnea chronic bronchitis were noted to infiltrate on chest x-ray she was treated with smoking cessation is intravenous Solu-Medrol for 3 days time aggressive nebulizer therapy she had some uncontrolled hypertension as well as some manic behavior which is acute on chronic she was given a nicotine patch (probably withdrawal from cigarettes and laced on Chantix as well she continued to improve 3 today. Was initially discharged on Pro Air inhaler and prednisone 20 mg daily for 10 days as well as takes twice a day  Hospital Course:  See history of present illness  Procedures:    Consultations:    Discharge Instructions     Medication List    STOP taking these medications        pregabalin 150 MG capsule  Commonly known as:  LYRICA      TAKE these medications        aerochamber Z-Stat Plus/medium inhaler  Use with each nebulizer     albuterol 108 (90 BASE) MCG/ACT inhaler  Commonly known as:  PROVENTIL HFA;VENTOLIN HFA  Inhale 2 puffs into the lungs every 6 (six) hours as  needed for wheezing or shortness of breath.     albuterol 108 (90 BASE) MCG/ACT inhaler  Commonly known as:  PROVENTIL HFA;VENTOLIN HFA  Inhale 2 puffs into the lungs every 6 (six) hours as needed for wheezing or shortness of breath.     amLODipine 10 MG tablet  Commonly known as:  NORVASC  Take 10 mg by mouth daily.     aspirin EC 81 MG tablet  Take 81 mg by mouth daily.     cloNIDine 0.2 MG tablet  Commonly known as:  CATAPRES  Take 0.2 mg by mouth 2 (two) times daily.     gabapentin 300 MG capsule  Commonly known as:  NEURONTIN  Take 900 mg by mouth daily as needed (pain).     HYDROcodone-acetaminophen 5-325 MG per tablet  Commonly known as:  NORCO/VICODIN  Take 2 tablets by mouth every 4 (four) hours as needed for moderate pain.     labetalol 200 MG tablet  Commonly known as:  NORMODYNE  Take 200 mg by mouth 2 (two) times daily.     LORazepam 1 MG tablet  Commonly known as:  ATIVAN  Take 1 mg by mouth at bedtime.     nicotine 21 mg/24hr patch  Commonly known as:  NICODERM CQ - dosed in mg/24 hours  Place 1 patch (21 mg total) onto the skin daily.     predniSONE 20 MG tablet  Commonly known  as:  DELTASONE  One tab P.O. Daily for ten days. Then stop.     QUEtiapine 300 MG tablet  Commonly known as:  SEROQUEL  Take 600 mg by mouth at bedtime.     QUEtiapine 50 MG tablet  Commonly known as:  SEROQUEL  Take 50 mg by mouth 4 (four) times daily - after meals and at bedtime.     varenicline 0.5 MG tablet  Commonly known as:  CHANTIX  Take 1 tablet (0.5 mg total) by mouth 2 (two) times daily.       Allergies  Allergen Reactions  . Adhesive [Tape] Rash      The results of significant diagnostics from this hospitalization (including imaging, microbiology, ancillary and laboratory) are listed below for reference.    Significant Diagnostic Studies: Dg Chest 2 View  09/28/2014   CLINICAL DATA:  Acute onset of dyspnea on exertion. Initial encounter.  EXAM: CHEST   2 VIEW  COMPARISON:  Chest radiograph and CTA of the chest performed 07/24/2014  FINDINGS: The lungs are well-aerated. Mild peribronchial thickening is noted. There is no evidence of focal opacification, pleural effusion or pneumothorax.  The heart is normal in size; the mediastinal contour is within normal limits. No acute osseous abnormalities are seen.  IMPRESSION: Mild peribronchial thickening noted; lungs otherwise clear.   Electronically Signed   By: Roanna Raider M.D.   On: 09/28/2014 06:01    Microbiology: No results found for this or any previous visit (from the past 240 hour(s)).   Labs: Basic Metabolic Panel:  Recent Labs Lab 09/28/14 0530 09/29/14 0651  NA 140 141  K 3.5 4.3  CL 111 109  CO2 20* 24  GLUCOSE 127* 170*  BUN 14 14  CREATININE 0.89 0.91  CALCIUM 8.6* 9.7   Liver Function Tests:  Recent Labs Lab 09/28/14 0530  AST 27  ALT 29  ALKPHOS 85  BILITOT 0.8  PROT 7.3  ALBUMIN 3.9   No results for input(s): LIPASE, AMYLASE in the last 168 hours. No results for input(s): AMMONIA in the last 168 hours. CBC:  Recent Labs Lab 09/28/14 0450 09/29/14 0651  WBC 8.5 10.7*  NEUTROABS 5.4  --   HGB 12.7 12.4  HCT 37.1 37.3  MCV 80.5 82.0  PLT 339 384   Cardiac Enzymes:  Recent Labs Lab 09/28/14 0530 09/28/14 1342 09/28/14 2205  TROPONINI <0.03 <0.03 <0.03   BNP: BNP (last 3 results) No results for input(s): BNP in the last 8760 hours.  ProBNP (last 3 results) No results for input(s): PROBNP in the last 8760 hours.  CBG:  Recent Labs Lab 09/29/14 2228 09/30/14 2156  GLUCAP 271* 258*       Signed:  Chris Cripps M  [facility] Pager: [phone] 10/01/2014, 10:28 AM

## 2014-10-01 NOTE — Progress Notes (Signed)
64 Spoke with Dr.DonDiego who seen patient this morning. Order given to d/c home today.

## 2014-12-17 ENCOUNTER — Emergency Department (HOSPITAL_COMMUNITY)
Admission: EM | Admit: 2014-12-17 | Discharge: 2014-12-17 | Disposition: A | Discharge status: Home / Self Care | Payer: Medicare Other | Attending: Emergency Medicine | Admitting: Emergency Medicine

## 2014-12-17 ENCOUNTER — Emergency Department (HOSPITAL_COMMUNITY): Payer: Medicare Other

## 2014-12-17 ENCOUNTER — Encounter (HOSPITAL_COMMUNITY): Payer: Self-pay | Admitting: Emergency Medicine

## 2014-12-17 DIAGNOSIS — Z720 Tobacco use: Secondary | ICD-10-CM | POA: Insufficient documentation

## 2014-12-17 DIAGNOSIS — Z7982 Long term (current) use of aspirin: Secondary | ICD-10-CM | POA: Diagnosis not present

## 2014-12-17 DIAGNOSIS — R197 Diarrhea, unspecified: Secondary | ICD-10-CM | POA: Insufficient documentation

## 2014-12-17 DIAGNOSIS — I10 Essential (primary) hypertension: Secondary | ICD-10-CM | POA: Diagnosis not present

## 2014-12-17 DIAGNOSIS — R112 Nausea with vomiting, unspecified: Secondary | ICD-10-CM | POA: Diagnosis not present

## 2014-12-17 DIAGNOSIS — R51 Headache: Secondary | ICD-10-CM | POA: Diagnosis present

## 2014-12-17 DIAGNOSIS — Z79899 Other long term (current) drug therapy: Secondary | ICD-10-CM | POA: Diagnosis not present

## 2014-12-17 DIAGNOSIS — R519 Headache, unspecified: Secondary | ICD-10-CM

## 2014-12-17 DIAGNOSIS — Z3202 Encounter for pregnancy test, result negative: Secondary | ICD-10-CM | POA: Diagnosis not present

## 2014-12-17 DIAGNOSIS — Z8739 Personal history of other diseases of the musculoskeletal system and connective tissue: Secondary | ICD-10-CM | POA: Diagnosis not present

## 2014-12-17 LAB — URINE MICROSCOPIC-ADD ON

## 2014-12-17 LAB — URINALYSIS, ROUTINE W REFLEX MICROSCOPIC
Bilirubin Urine: NEGATIVE
Glucose, UA: NEGATIVE mg/dL
Hgb urine dipstick: NEGATIVE
Ketones, ur: NEGATIVE mg/dL
Nitrite: NEGATIVE
Protein, ur: NEGATIVE mg/dL
Specific Gravity, Urine: 1.01 (ref 1.005–1.030)
Urobilinogen, UA: 0.2 mg/dL (ref 0.0–1.0)
pH: 6.5 (ref 5.0–8.0)

## 2014-12-17 LAB — CBC WITH DIFFERENTIAL/PLATELET
Basophils Absolute: 0 10*3/uL (ref 0.0–0.1)
Basophils Relative: 1 %
Eosinophils Absolute: 0 10*3/uL (ref 0.0–0.7)
Eosinophils Relative: 1 %
HCT: 39.1 % (ref 36.0–46.0)
Hemoglobin: 12.8 g/dL (ref 12.0–15.0)
Lymphocytes Relative: 42 %
Lymphs Abs: 1.9 10*3/uL (ref 0.7–4.0)
MCH: 27.1 pg (ref 26.0–34.0)
MCHC: 32.7 g/dL (ref 30.0–36.0)
MCV: 82.8 fL (ref 78.0–100.0)
Monocytes Absolute: 0.2 10*3/uL (ref 0.1–1.0)
Monocytes Relative: 5 %
Neutro Abs: 2.3 10*3/uL (ref 1.7–7.7)
Neutrophils Relative %: 51 %
Platelets: 374 10*3/uL (ref 150–400)
RBC: 4.72 MIL/uL (ref 3.87–5.11)
RDW: 16.5 % — ABNORMAL HIGH (ref 11.5–15.5)
WBC: 4.4 10*3/uL (ref 4.0–10.5)

## 2014-12-17 LAB — COMPREHENSIVE METABOLIC PANEL
ALT: 21 U/L (ref 14–54)
AST: 16 U/L (ref 15–41)
Albumin: 4.2 g/dL (ref 3.5–5.0)
Alkaline Phosphatase: 86 U/L (ref 38–126)
Anion gap: 6 (ref 5–15)
BUN: 14 mg/dL (ref 6–20)
CO2: 19 mmol/L — ABNORMAL LOW (ref 22–32)
Calcium: 8.9 mg/dL (ref 8.9–10.3)
Chloride: 112 mmol/L — ABNORMAL HIGH (ref 101–111)
Creatinine, Ser: 0.8 mg/dL (ref 0.44–1.00)
GFR calc Af Amer: >60 mL/min (ref >60)
GFR calc non Af Amer: >60 mL/min (ref >60)
Glucose, Bld: 134 mg/dL — ABNORMAL HIGH (ref 65–99)
Potassium: 3.8 mmol/L (ref 3.5–5.1)
Sodium: 137 mmol/L (ref 135–145)
Total Bilirubin: 0.8 mg/dL (ref 0.3–1.2)
Total Protein: 7.2 g/dL (ref 6.5–8.1)

## 2014-12-17 LAB — PREGNANCY, URINE: Preg Test, Ur: NEGATIVE

## 2014-12-17 MED ORDER — ACETAMINOPHEN 500 MG PO TABS
1000.0000 mg | ORAL_TABLET | Freq: Once | ORAL | Status: AC
  Administered 2014-12-17: 1000 mg via ORAL
  Filled 2014-12-17: qty 2

## 2014-12-17 MED ORDER — SODIUM CHLORIDE 0.9 % IV BOLUS (SEPSIS)
1000.0000 mL | Freq: Once | INTRAVENOUS | Status: AC
  Administered 2014-12-17: 1000 mL via INTRAVENOUS

## 2014-12-17 MED ORDER — KETOROLAC TROMETHAMINE 30 MG/ML IJ SOLN
30.0000 mg | Freq: Once | INTRAMUSCULAR | Status: AC
  Administered 2014-12-17: 30 mg via INTRAVENOUS
  Filled 2014-12-17: qty 1

## 2014-12-17 MED ORDER — METOCLOPRAMIDE HCL 5 MG/ML IJ SOLN
10.0000 mg | Freq: Once | INTRAMUSCULAR | Status: AC
  Administered 2014-12-17: 10 mg via INTRAVENOUS
  Filled 2014-12-17: qty 2

## 2014-12-17 MED ORDER — PROMETHAZINE HCL 25 MG PO TABS: 25.0000 mg | ORAL_TABLET | Freq: Four times a day (QID) | ORAL | Status: DC | PRN

## 2014-12-17 MED ORDER — PROMETHAZINE HCL 25 MG/ML IJ SOLN
25.0000 mg | Freq: Once | INTRAMUSCULAR | Status: AC
  Administered 2014-12-17: 25 mg via INTRAVENOUS
  Filled 2014-12-17: qty 1

## 2014-12-17 NOTE — Discharge Instructions (Signed)
Please follow-up with your primary care provider next week as scheduled. Return for worsening symptoms including vomiting unable to keep down food or fluids, confusion, chest pain or difficulty breathing, worsening pain, or any other symptoms concerning to you.   Hypertension Hypertension is another name for high blood pressure. High blood pressure forces your heart to work harder to pump blood. A blood pressure reading has two numbers, which includes a higher number over a lower number (example: 110/72). HOME CARE   Have your blood pressure rechecked by your doctor.  Only take medicine as told by your doctor. Follow the directions carefully. The medicine does not work as well if you skip doses. Skipping doses also puts you at risk for problems.  Do not smoke.  Monitor your blood pressure at home as told by your doctor. GET HELP IF:  You think you are having a reaction to the medicine you are taking.  You have repeat headaches or feel dizzy.  You have puffiness (swelling) in your ankles.  You have trouble with your vision. GET HELP RIGHT AWAY IF:   You get a very bad headache and are confused.  You feel weak, numb, or faint.  You get chest or belly (abdominal) pain.  You throw up (vomit).  You cannot breathe very well. MAKE SURE YOU:   Understand these instructions.  Will watch your condition.  Will get help right away if you are not doing well or get worse.   This information is not intended to replace advice given to you by your health care provider. Make sure you discuss any questions you have with your health care provider.   Document Released: 07/24/2007 Document Revised: 02/09/2013 Document Reviewed: 11/27/2012 Elsevier Interactive Patient Education 2016 [employer].  Migraine Headache A migraine headache is very bad, throbbing pain on one or both sides of your head. Talk to your doctor about what things may bring on (trigger) your migraine headaches. HOME  CARE  Only take medicines as told by your doctor.  Lie down in a dark, quiet room when you have a migraine.  Keep a journal to find out if certain things bring on migraine headaches. For example, write down:  What you eat and drink.  How much sleep you get.  Any change to your diet or medicines.  Lessen how much alcohol you drink.  Quit smoking if you smoke.  Get enough sleep.  Lessen any stress in your life.  Keep lights dim if bright lights bother you or make your migraines worse. GET HELP RIGHT AWAY IF:   Your migraine becomes really bad.  You have a fever.  You have a stiff neck.  You have trouble seeing.  Your muscles are weak, or you lose muscle control.  You lose your balance or have trouble walking.  You feel like you will pass out (faint), or you pass out.  You have really bad symptoms that are different than your first symptoms. MAKE SURE YOU:   Understand these instructions.  Will watch your condition.  Will get help right away if you are not doing well or get worse.   This information is not intended to replace advice given to you by your health care provider. Make sure you discuss any questions you have with your health care provider.   Document Released: 11/14/2007 Document Revised: 04/29/2011 Document Reviewed: 10/12/2012 Elsevier Interactive Patient Education 2016 [employer]  Nausea and Vomiting Nausea means you feel sick to your stomach. Throwing up (vomiting) is  a reflex where stomach contents come out of your mouth. HOME CARE   Take medicine as told by your doctor.  Do not force yourself to eat. However, you do need to drink fluids.  If you feel like eating, eat a normal diet as told by your doctor.  Eat rice, wheat, potatoes, bread, lean meats, yogurt, fruits, and vegetables.  Avoid high-fat foods.  Drink enough fluids to keep your pee (urine) clear or pale yellow.  Ask your doctor how to replace body fluid losses  (rehydrate). Signs of body fluid loss (dehydration) include:  Feeling very thirsty.  Dry lips and mouth.  Feeling dizzy.  Dark pee.  Peeing less than normal.  Feeling confused.  Fast breathing or heart rate. GET HELP RIGHT AWAY IF:   You have blood in your throw up.  You have black or bloody poop (stool).  You have a bad headache or stiff neck.  You feel confused.  You have bad belly (abdominal) pain.  You have chest pain or trouble breathing.  You do not pee at least once every 8 hours.  You have cold, clammy skin.  You keep throwing up after 24 to 48 hours.  You have a fever. MAKE SURE YOU:   Understand these instructions.  Will watch your condition.  Will get help right away if you are not doing well or get worse.   This information is not intended to replace advice given to you by your health care provider. Make sure you discuss any questions you have with your health care provider.   Document Released: 07/24/2007 Document Revised: 04/29/2011 Document Reviewed: 07/06/2010 Elsevier Interactive Patient Education 2016 [employer]  Diarrhea Diarrhea is frequent loose and watery bowel movements. It can cause you to feel weak and dehydrated. Dehydration can cause you to become tired and thirsty, have a dry mouth, and have decreased urination that often is dark yellow. Diarrhea is a sign of another problem, most often an infection that will not last long. In most cases, diarrhea typically lasts 2-3 days. However, it can last longer if it is a sign of something more serious. It is important to treat your diarrhea as directed by your caregiver to lessen or prevent future episodes of diarrhea. CAUSES  Some common causes include:  Gastrointestinal infections caused by viruses, bacteria, or parasites.  Food poisoning or food allergies.  Certain medicines, such as antibiotics, chemotherapy, and laxatives.  Artificial sweeteners and fructose.  Digestive  disorders. HOME CARE INSTRUCTIONS  Ensure adequate fluid intake (hydration): Have 1 cup (8 oz) of fluid for each diarrhea episode. Avoid fluids that contain simple sugars or sports drinks, fruit juices, whole milk products, and sodas. Your urine should be clear or pale yellow if you are drinking enough fluids. Hydrate with an oral rehydration solution that you can purchase at pharmacies, retail stores, and online. You can prepare an oral rehydration solution at home by mixing the following ingredients together:   - tsp table salt.   tsp baking soda.   tsp salt substitute containing potassium chloride.  1  tablespoons sugar.  1 L (34 oz) of water.  Certain foods and beverages may increase the speed at which food moves through the gastrointestinal (GI) tract. These foods and beverages should be avoided and include:  Caffeinated and alcoholic beverages.  High-fiber foods, such as raw fruits and vegetables, nuts, seeds, and whole grain breads and cereals.  Foods and beverages sweetened with sugar alcohols, such as xylitol, sorbitol, and mannitol.  Some foods may be well tolerated and may help thicken stool including:  Starchy foods, such as rice, toast, pasta, low-sugar cereal, oatmeal, grits, baked potatoes, crackers, and bagels.  Bananas.  Applesauce.  Add probiotic-rich foods to help increase healthy bacteria in the GI tract, such as yogurt and fermented milk products.  Wash your hands well after each diarrhea episode.  Only take over-the-counter or prescription medicines as directed by your caregiver.  Take a warm bath to relieve any burning or pain from frequent diarrhea episodes. SEEK IMMEDIATE MEDICAL CARE IF:   You are unable to keep fluids down.  You have persistent vomiting.  You have blood in your stool, or your stools are black and tarry.  You do not urinate in 6-8 hours, or there is only a small amount of very dark urine.  You have abdominal pain that  increases or localizes.  You have weakness, dizziness, confusion, or light-headedness.  You have a severe headache.  Your diarrhea gets worse or does not get better.  You have a fever or persistent symptoms for more than 2-3 days.  You have a fever and your symptoms suddenly get worse. MAKE SURE YOU:   Understand these instructions.  Will watch your condition.  Will get help right away if you are not doing well or get worse.   This information is not intended to replace advice given to you by your health care provider. Make sure you discuss any questions you have with your health care provider.   Document Released: 01/25/2002 Document Revised: 02/25/2014 Document Reviewed: 10/13/2011 Elsevier Interactive Patient Education [employer]

## 2014-12-17 NOTE — ED Notes (Signed)
Pt states she had x3 episodes of emesis in the past 2 days.

## 2014-12-17 NOTE — ED Notes (Signed)
MD at bedside. 

## 2014-12-17 NOTE — ED Notes (Addendum)
Pt comes stating she has been running high blood pressure. Last one was 219/121 taken this morning. Pt states she has been having HA with pressure in the back of her head and dizziness. Pt states she has been taking her HTN medication as prescribed.

## 2014-12-17 NOTE — ED Provider Notes (Signed)
CSN: [id number]     Arrival date & time 12/17/14  4098 History   First MD Initiated Contact with Patient 12/17/14 1010     Chief Complaint  Patient presents with  . Hypertension     (Consider location/radiation/quality/duration/timing/severity/associated sxs/prior Treatment) HPI 49 year old female who presents with headache and a little elevated blood pressures. History of hypertension on multiple agents. States that she thinks her blood pressures typically poorly controlled, and has had associating headaches whenever her blood pressure is elevated. Also states history of migraine headaches. States that for the past week she has been having daily atypical headaches that are not similar to prior headaches. Headaches initially started off as mild, but has gradually worsened over the course of the week, and she currently states that her headache is 11 out of 10 in severity. HA sharp in nature, behind the eyes, and radiates to back of the head.  She has had multiple episodes of nausea and vomiting with photophobia over past 2 days. Over the past week she has also had diarrhea, multiple times per day. Denies any associating abdominal pain, fevers or chills. Has not had any head trauma, denies that headache is worse in the morning. Has not had any vision changes, speech changes, numbness or weakness, chest pain or difficulty breathing. Has documented blood pressures at home measuring 180-200s/100-110s.   Past Medical History  Diagnosis Date  . Hypertension   . DDD (degenerative disc disease), lumbar    Past Surgical History  Procedure Laterality Date  . Arm surgery    . Carpal tunnel release Right   . Partial hysterectomy     Family History  Problem Relation Age of Onset  . Cancer Other   . Diabetes Other   . Stroke Other    Social History  Substance Use Topics  . Smoking status: Current Every Day Smoker -- 0.50 packs/day for 32 years    Types: Cigarettes  . Smokeless tobacco: Never  Used  . Alcohol Use: No   OB History    Gravida Para Term Preterm AB TAB SAB Ectopic Multiple Living   Review of Systems 10/14 systems reviewed and are negative other than those stated in the HPI    Allergies  Adhesive  Home Medications   Prior to Admission medications   Medication Sig Start Date End Date Taking? Authorizing Provider  albuterol (PROVENTIL HFA;VENTOLIN HFA) 108 (90 BASE) MCG/ACT inhaler Inhale 2 puffs into the lungs every 6 (six) hours as needed for wheezing or shortness of breath.    Historical Provider, MD  albuterol (PROVENTIL HFA;VENTOLIN HFA) 108 (90 BASE) MCG/ACT inhaler Inhale 2 puffs into the lungs every 6 (six) hours as needed for wheezing or shortness of breath. 10/01/14   Oval Linsey, MD  amLODipine (NORVASC) 10 MG tablet Take 10 mg by mouth daily.    Historical Provider, MD  aspirin EC 81 MG tablet Take 81 mg by mouth daily.    Historical Provider, MD  cloNIDine (CATAPRES) 0.2 MG tablet Take 0.2 mg by mouth 2 (two) times daily.    Historical Provider, MD  gabapentin (NEURONTIN) 300 MG capsule Take 900 mg by mouth daily as needed (pain).     Historical Provider, MD  HYDROcodone-acetaminophen (NORCO/VICODIN) 5-325 MG per tablet Take 2 tablets by mouth every 4 (four) hours as needed for moderate pain. 07/24/14   Gilda Crease, MD  labetalol (NORMODYNE) 200 MG tablet Take 200  mg by mouth 2 (two) times daily.    Historical Provider, MD  LORazepam (ATIVAN) 1 MG tablet Take 1 mg by mouth at bedtime.     Historical Provider, MD  nicotine (NICODERM CQ - DOSED IN MG/24 HOURS) 21 mg/24hr patch Place 1 patch (21 mg total) onto the skin daily. 10/01/14   Oval Linsey, MD  predniSONE (DELTASONE) 20 MG tablet One tab P.O. Daily for ten days. Then stop. 10/01/14   Oval Linsey, MD  QUEtiapine (SEROQUEL) 300 MG tablet Take 600 mg by mouth at bedtime.  06/13/14   Historical Provider, MD  QUEtiapine (SEROQUEL) 50 MG tablet Take 50 mg by  mouth 4 (four) times daily - after meals and at bedtime.  06/13/14   Historical Provider, MD  Spacer/Aero-Holding Chambers (AEROCHAMBER Z-STAT PLUS/MEDIUM) inhaler Use with each nebulizer 10/01/14   Oval Linsey, MD  varenicline (CHANTIX) 0.5 MG tablet Take 1 tablet (0.5 mg total) by mouth 2 (two) times daily. 10/01/14   Oval Linsey, MD   BP 155/103 mmHg  Pulse 76  Temp(Src) 98.5 F (36.9 C) (Oral)  Resp 15  Ht 5' 4.5" (1.638 m)  Wt 250 lb (113.399 kg)  BMI 42.27 kg/m2  SpO2 100% Physical Exam Physical Exam  Nursing note and vitals reviewed. Constitutional: Well developed, well nourished, non-toxic, and in no acute distress Head: Normocephalic and atraumatic.  Mouth/Throat: Oropharynx is clear and moist.  Neck: Normal range of motion. Neck supple. No mengingismus Cardiovascular: Normal rate and regular rhythm.   Pulmonary/Chest: Effort normal and breath sounds normal.  Abdominal: Soft. There is no tenderness. There is no rebound and no guarding.  Musculoskeletal: Normal range of motion.  Skin: Skin is warm and dry.  Psychiatric: Cooperative Neurological:  Alert, oriented to person, place, time, and situation. Memory grossly in tact. Fluent speech. No dysarthria or aphasia.  Cranial nerves: VF are full. Pupils are symmetric, and reactive to light. EOMI without nystagmus. No gaze deviation. Facial muscles symmetric with activation. Sensation to light touch over face in tact bilaterally. Hearing grossly in tact. Palate elevates symmetrically. Head turn and shoulder shrug are intact. Tongue midline.  Muscle bulk and tone normal. No pronator drift. Moves all extremities symmetrically. Sensation to light touch is in tact throughout in bilateral upper and lower extremities. Coordination reveals no dysmetria with finger to nose. Gait is narrow-based and steady. Non-ataxic.  ED Course  Procedures (including critical care time) Labs Review Labs Reviewed  CBC WITH  DIFFERENTIAL/PLATELET  COMPREHENSIVE METABOLIC PANEL  URINALYSIS, ROUTINE W REFLEX MICROSCOPIC (NOT AT [facility])    Imaging Review No results found. I have personally reviewed and evaluated these images and lab results as part of my medical decision-making.   EKG Interpretation   Date/Time:  Saturday December 17 2014 10:33:29 EDT Ventricular Rate:  72 PR Interval:  155 QRS Duration: 84 QT Interval:  408 QTC Calculation: 446 R Axis:   -27 Text Interpretation:  Sinus rhythm Borderline left axis deviation Low  voltage, precordial leads No acute ST or T-wave changes No significant  change since last tracing Confirmed by Elvis Boot MD, Chenay Nesmith ([id number]) on 12/17/2014  10:39:28 AM      MDM   Final diagnoses:  None    In short, this is a 49 year old female with history of HTN who presents with headache, nausea, vomiting, and elevated blood pressures. She is well appearing on arrival, and is in no acute distress. She is hypertensive 150/100s. Neurological exam is in tact. Cardiopulmonary exam is unremarkable  and abdomen is benign. Low suspicion for serious secondary etiology of her elevated blood pressure. Given atypical headache for one week with elevated blood pressures, CT head performed. Visualized and does not reveal acute intracranial processes. Blood work and UA unremarkable otherwise. Appear to have poorly controlled blood pressure for quite sometime now. Does not have any secondary etiologies that require acute lowering currently. HA also sounds migrainous in nature, and treated with IVF, phenergan, reglan, toradol, and tylenol. N/V/D as well, likely contributing to HA, and suggestive of benign GI illness. Discussed supportive care at home. Patient has follow-up appt with PCP in 2-3 days, which she will discuss further management of blood pressure. Strict return and follow-up instructions reviewed. She expressed understanding of all discharge instructions and felt comfortable with the plan of  care.     Lavera Guise, MD 12/17/14 [phone]

## 2015-03-31 ENCOUNTER — Ambulatory Visit (INDEPENDENT_AMBULATORY_CARE_PROVIDER_SITE_OTHER): Payer: Medicare Other | Admitting: Neurology

## 2015-03-31 ENCOUNTER — Encounter: Payer: Self-pay | Admitting: Neurology

## 2015-03-31 ENCOUNTER — Other Ambulatory Visit (INDEPENDENT_AMBULATORY_CARE_PROVIDER_SITE_OTHER): Payer: Medicare Other

## 2015-03-31 VITALS — BP 170/90 | HR 65 | Wt 240.0 lb

## 2015-03-31 DIAGNOSIS — M7989 Other specified soft tissue disorders: Secondary | ICD-10-CM | POA: Diagnosis not present

## 2015-03-31 DIAGNOSIS — M79641 Pain in right hand: Secondary | ICD-10-CM | POA: Diagnosis not present

## 2015-03-31 DIAGNOSIS — M2550 Pain in unspecified joint: Secondary | ICD-10-CM

## 2015-03-31 LAB — COMPREHENSIVE METABOLIC PANEL
ALT: 22 U/L (ref 0–35)
AST: 20 U/L (ref 0–37)
Albumin: 3.9 g/dL (ref 3.5–5.2)
Alkaline Phosphatase: 92 U/L (ref 39–117)
BUN: 12 mg/dL (ref 6–23)
CO2: 27 mEq/L (ref 19–32)
Calcium: 9.1 mg/dL (ref 8.4–10.5)
Chloride: 109 mEq/L (ref 96–112)
Creatinine, Ser: 1.2 mg/dL (ref 0.40–1.20)
GFR: 61 mL/min (ref >60.00)
Glucose, Bld: 104 mg/dL — ABNORMAL HIGH (ref 70–99)
Potassium: 3.9 mEq/L (ref 3.5–5.1)
Sodium: 139 mEq/L (ref 135–145)
Total Bilirubin: 0.3 mg/dL (ref 0.2–1.2)
Total Protein: 6.7 g/dL (ref 6.0–8.3)

## 2015-03-31 LAB — SEDIMENTATION RATE: Sed Rate: 9 mm/hr (ref 0–22)

## 2015-03-31 LAB — RHEUMATOID FACTOR: Rhuematoid fact SerPl-aCnc: <10 IU/mL (ref <=14)

## 2015-03-31 NOTE — Patient Instructions (Addendum)
1.  Check blood work 2.  Referral to rheumatology for evaluation of hand swelling and pain

## 2015-03-31 NOTE — Progress Notes (Signed)
Note routed
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
## 2015-04-03 LAB — ANA: Anti Nuclear Antibody(ANA): NEGATIVE

## 2015-05-15 ENCOUNTER — Other Ambulatory Visit: Payer: Self-pay | Admitting: Family Medicine

## 2015-05-15 DIAGNOSIS — M79641 Pain in right hand: Secondary | ICD-10-CM

## 2015-05-15 DIAGNOSIS — M2550 Pain in unspecified joint: Secondary | ICD-10-CM

## 2015-05-15 DIAGNOSIS — M7989 Other specified soft tissue disorders: Secondary | ICD-10-CM

## 2015-05-16 ENCOUNTER — Ambulatory Visit (INDEPENDENT_AMBULATORY_CARE_PROVIDER_SITE_OTHER): Payer: Medicare Other | Admitting: Neurology

## 2015-05-16 DIAGNOSIS — M79642 Pain in left hand: Secondary | ICD-10-CM | POA: Diagnosis not present

## 2015-05-16 DIAGNOSIS — M5412 Radiculopathy, cervical region: Secondary | ICD-10-CM

## 2015-05-16 DIAGNOSIS — M79641 Pain in right hand: Secondary | ICD-10-CM

## 2015-05-16 DIAGNOSIS — G5603 Carpal tunnel syndrome, bilateral upper limbs: Secondary | ICD-10-CM

## 2015-05-16 DIAGNOSIS — M2550 Pain in unspecified joint: Secondary | ICD-10-CM

## 2015-05-16 NOTE — Procedures (Addendum)
[facility]  [address] [place], [address]  [place], [place] [address] Tel: [phone] Fax:  [phone] Test Date:  05/16/2015  Patient: [name] DOB: 1965-02-29 Physician: Nita Sickle, DO  Sex: Female Height: 5\' 4"  Ref Phys: Zenovia Jordan, M.D.  ID#: [id number] Temp: 32.6C Technician: Judie Petit. Dean   Patient Complaints: This is a 50 year-old female referred for evaluation of bilateral hand pain.  NCV & EMG Findings: Extensive electrodiagnostic testing of the right upper extremity and additional studies of the left shows: 1. Bilateral median sensory responses show prolonged latency and low-normal amplitudes. Bilateral ulnar sensory responses are within normal limits. 2. Bilateral median motor responses show prolonged distal onset latency (L4.7, R4.8 ms). Bilateral ulnar motor responses are within normal limits. 3. Chronic motor axon loss changes are seen affecting the right C6 and left C8 myotomes, without accompanied active denervation.  Impression: 1. Bilateral median neuropathy at or distal to the wrist, consistent with the clinical diagnosis of carpal tunnel syndrome. Overall, these findings are mild to moderate in degree electrically. 2. Chronic right C5 radiculopathy, mild in degree electrically 3. Chronic left C8 radiculopathy, mild in degree electrically   ___________________________ Nita Sickle, DO    Nerve Conduction Studies Anti Sensory Summary Table   Site NR Peak (ms) Norm Peak (ms) P-T Amp (V) Norm P-T Amp  Left Median Anti Sensory (2nd Digit)  Wrist    3.9 <3.6 16.1 >15  Right Median Anti Sensory (2nd Digit)  34.2C  Wrist    4.6 <3.6 15.8 >15  Left Ulnar Anti Sensory (5th Digit)  Wrist    3.0 <3.1 30.4 >10  Right Ulnar Anti Sensory (5th Digit)  34.2C  Wrist    3.0 <3.1 25.7 >10   Motor Summary Table   Site NR Onset (ms) Norm Onset (ms) O-P Amp (mV) Norm O-P Amp Site1 Site2 Delta-0 (ms) Dist (cm) Vel (m/s) Norm Vel (m/s)  Left Median Motor  (Abd Poll Brev)  Wrist    4.7 <4.0 8.0 >6 Elbow Wrist 4.6 25.0 54 >50  Elbow    9.3  7.0         Right Median Motor (Abd Poll Brev)  34.2C  Wrist    4.8 <4.0 9.0 >6 Elbow Wrist 4.3 25.0 58 >50  Elbow    9.1  8.8         Left Ulnar Motor (Abd Dig Minimi)  Wrist    2.7 <3.1 9.2 >7 B Elbow Wrist 4.3 23.0 53 >50  B Elbow    7.0  8.6  A Elbow B Elbow 2.1 10.0 50 >50  A Elbow    9.1  8.1         Right Ulnar Motor (Abd Dig Minimi)  34.2C  Wrist    2.8 <3.1 7.3 >7 B Elbow Wrist 3.9 22.0 56 >50  B Elbow    6.7  6.3  A Elbow B Elbow 2.0 10.0 50 >50  A Elbow    8.7  6.4          EMG   Side Muscle Ins Act Fibs Psw Fasc Number Recrt Dur Dur. Amp Amp. Poly Poly. Comment  Right 1stDorInt Nml Nml Nml Nml 1- Mod-V Nml Nml Nml Nml Nml Nml N/A  Right Abd Poll Brev Nml Nml Nml Nml 1- Mod-V Nml Nml Nml Nml Nml Nml N/A  Right Ext Indicis Nml Nml Nml Nml 1- Mod-V Nml Nml Nml Nml Nml Nml N/A  Right PronatorTeres Nml Nml Nml  Nml 1- Mod-V Nml Nml Nml Nml Nml Nml N/A  Right Biceps Nml Nml Nml Nml 1- Rapid Some 1+ Some 1+ Nml Nml N/A  Right Triceps Nml Nml Nml Nml 1- Mod-V Nml Nml Nml Nml Nml Nml N/A  Right Deltoid Nml Nml Nml Nml 1- Mod-R Some 1+ Some 1+ Nml Nml N/A  Left 1stDorInt Nml Nml Nml Nml 1- Mod-R Some 1+ Some 1+ Nml Nml N/A  Left Abd Poll Brev Nml Nml Nml Nml 1- Mod-V Nml Nml Nml Nml Nml Nml N/A  Left Ext Indicis Nml Nml Nml Nml 1- Mod-R Some 1+ Some 1+ Nml Nml N/A  Left PronatorTeres Nml Nml Nml Nml 1- Mod-V Nml Nml Nml Nml Nml Nml N/A  Left Biceps Nml Nml Nml Nml 1- Mod-V Nml Nml Nml Nml Nml Nml N/A  Left Triceps Nml Nml Nml Nml 1- Mod-V Nml Nml Nml Nml Nml Nml N/A  Left Deltoid Nml Nml Nml Nml 1- Mod-V Nml Nml Nml Nml Nml Nml N/A      Waveforms:

## 2015-05-25 ENCOUNTER — Encounter: Payer: Self-pay | Admitting: *Deleted

## 2015-06-09 ENCOUNTER — Encounter: Payer: Self-pay | Admitting: Neurology

## 2015-06-09 ENCOUNTER — Ambulatory Visit (INDEPENDENT_AMBULATORY_CARE_PROVIDER_SITE_OTHER): Payer: Medicare Other | Admitting: Neurology

## 2015-06-09 VITALS — BP 130/84 | HR 81 | Ht 64.5 in

## 2015-06-09 DIAGNOSIS — G5603 Carpal tunnel syndrome, bilateral upper limbs: Secondary | ICD-10-CM
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
## 2015-06-09 NOTE — Patient Instructions (Signed)
Start using right hand wrist splint Avoid over bending at the wrist  Return to clinic in 4 months

## 2015-09-28 ENCOUNTER — Emergency Department (HOSPITAL_COMMUNITY)
Admission: EM | Admit: 2015-09-28 | Discharge: 2015-09-28 | Disposition: A | Discharge status: Home / Self Care | Payer: Medicare Other | Attending: Emergency Medicine | Admitting: Emergency Medicine

## 2015-09-28 ENCOUNTER — Encounter (HOSPITAL_COMMUNITY): Payer: Self-pay | Admitting: Emergency Medicine

## 2015-09-28 DIAGNOSIS — F17210 Nicotine dependence, cigarettes, uncomplicated: Secondary | ICD-10-CM | POA: Insufficient documentation

## 2015-09-28 DIAGNOSIS — I10 Essential (primary) hypertension: Secondary | ICD-10-CM | POA: Insufficient documentation

## 2015-09-28 DIAGNOSIS — J449 Chronic obstructive pulmonary disease, unspecified: Secondary | ICD-10-CM | POA: Diagnosis not present

## 2015-09-28 DIAGNOSIS — L03012 Cellulitis of left finger: Secondary | ICD-10-CM | POA: Diagnosis not present

## 2015-09-28 DIAGNOSIS — Z79899 Other long term (current) drug therapy: Secondary | ICD-10-CM | POA: Insufficient documentation

## 2015-09-28 DIAGNOSIS — Z7982 Long term (current) use of aspirin: Secondary | ICD-10-CM | POA: Insufficient documentation

## 2015-09-28 DIAGNOSIS — M79642 Pain in left hand: Secondary | ICD-10-CM | POA: Diagnosis present

## 2015-09-28 MED ORDER — LIDOCAINE HCL (PF) 1 % IJ SOLN
INTRAMUSCULAR | Status: AC
  Filled 2015-09-28: qty 5

## 2015-09-28 MED ORDER — HYDROCODONE-ACETAMINOPHEN 5-325 MG PO TABS
1.0000 | ORAL_TABLET | Freq: Once | ORAL | Status: AC
  Administered 2015-09-28: 1 via ORAL
  Filled 2015-09-28: qty 1

## 2015-09-28 MED ORDER — LIDOCAINE HCL (PF) 1 % IJ SOLN
30.0000 mL | Freq: Once | INTRAMUSCULAR | Status: AC
  Administered 2015-09-28: 30 mL
  Filled 2015-09-28: qty 30

## 2015-09-28 NOTE — ED Provider Notes (Signed)
[facility] DEPT Provider Note   CSN: [id number] Arrival date & time: 09/28/15  1257  First Provider Contact:  None       History   Chief Complaint Chief Complaint  Patient presents with  . Hand Pain    left    HPI [name] is a 50 y.o. female.  HPI   Patient is a 50 year old female with a history of hypertension presents the emergency department with left third finger pain and swelling. Patient states yesterday she woke with throbbing pain in the tip of her finger and has progressively worsened with pain in her entire finger this morning. Associated swelling and redness. Patient is a history of biting her nails. Patient denies numbness/timing, weakness, fevers, chills, nausea, abdominal pain, vomiting.  Past Medical History:  Diagnosis Date  . DDD (degenerative disc disease), lumbar   . Hypertension     Patient Active Problem List   Diagnosis Date Noted  . COPD with acute exacerbation (HCC) 09/28/2014  . Chest pain 09/28/2014  . Hyperglycemia 09/28/2014  . Tachycardia 09/28/2014  . COPD exacerbation (HCC) 09/28/2014  . MARIJUANA ABUSE 06/29/2007  . PANIC ATTACK, ACUTE 06/05/2007  . TOBACCO ABUSE 03/04/2007  . ANEMIA-IRON DEFICIENCY 02/17/2007  . DEPRESSION 02/17/2007  . Essential hypertension 02/17/2007  . ARM PAIN, LEFT 02/17/2007    Past Surgical History:  Procedure Laterality Date  . arm surgery    . CARPAL TUNNEL RELEASE Right   . PARTIAL HYSTERECTOMY      OB History    Gravida Para Term Preterm AB Living   SAB TAB Ectopic Multiple Live Births   3   1           Home Medications    Prior to Admission medications   Medication Sig Start Date End Date Taking? Authorizing Provider  albuterol (PROVENTIL HFA;VENTOLIN HFA) 108 (90 BASE) MCG/ACT inhaler Inhale 2 puffs into the lungs every 6 (six) hours as needed for wheezing or shortness of breath.   Yes Historical Provider, MD  amLODipine (NORVASC) 5 MG tablet Take 5 mg by mouth  daily. 02/22/15  Yes Historical Provider, MD  aspirin EC 81 MG tablet Take 81 mg by mouth daily.   Yes Historical Provider, MD  citalopram (CELEXA) 20 MG tablet Take 20 mg by mouth daily.   Yes Historical Provider, MD  cloNIDine (CATAPRES) 0.2 MG tablet Take 0.2 mg by mouth 2 (two) times daily.   Yes Historical Provider, MD  gabapentin (NEURONTIN) 300 MG capsule Take 1,200 mg by mouth 3 (three) times daily.    Yes Historical Provider, MD  labetalol (NORMODYNE) 200 MG tablet Take 200 mg by mouth 2 (two) times daily.   Yes Historical Provider, MD  promethazine (PHENERGAN) 25 MG tablet Take 1 tablet (25 mg total) by mouth every 6 (six) hours as needed for nausea or vomiting. 12/17/14  Yes Lavera Guise, MD  QUEtiapine (SEROQUEL) 300 MG tablet Take 600 mg by mouth at bedtime.  06/13/14  Yes Historical Provider, MD  QUEtiapine (SEROQUEL) 50 MG tablet Take 50 mg by mouth 4 (four) times daily - after meals and at bedtime.  06/13/14  Yes Historical Provider, MD  topiramate (TOPAMAX) 200 MG tablet Take 200 mg by mouth daily.   Yes Historical Provider, MD  zolpidem (AMBIEN) 10 MG tablet Take 10 mg by mouth at bedtime as needed. 05/29/15  Yes Historical Provider, MD    Family History Family History  Problem  Relation Age of Onset  . Pancreatic cancer Father     living  . Diabetes Father   . Diabetes Mother   . Lung cancer Mother   . Stroke Paternal Grandmother   . Diabetes Brother     Deceased  . Stroke Maternal Grandmother   . Healthy Son     Social History Social History  Substance Use Topics  . Smoking status: Current Every Day Smoker    Packs/day: 0.50    Years: 32.00    Types: Cigarettes  . Smokeless tobacco: Never Used  . Alcohol use No     Allergies   Adhesive [tape]   Review of Systems Review of Systems  Constitutional: Negative for chills and fever.  Gastrointestinal: Negative for abdominal pain, nausea and vomiting.  Musculoskeletal: Positive for arthralgias.  Skin: Positive  for color change (finger tip red).     Physical Exam Updated Vital Signs BP (!) 183/120 (BP Location: Right Arm)   Pulse 101   Temp 98.8 F (37.1 C) (Oral)   Resp 16   Ht 5\' 4"  (1.626 m)   Wt 108.9 kg   SpO2 100%   BMI 41.20 kg/m   Physical Exam  Constitutional: She appears well-developed and well-nourished. No distress.  HENT:  Head: Normocephalic and atraumatic.  Eyes: Conjunctivae are normal.  Cardiovascular:  Pulses:      Radial pulses are 2+ on the right side, and 2+ on the left side.  Pulmonary/Chest: Effort normal. No respiratory distress.  Musculoskeletal: Normal range of motion.  Examination of the left third finger revealed erythema and edema to the distal aspect, area of fluctuance at the cuticle, TTP, neurovascularly intact distally  Neurological: She is alert. Coordination normal.  Skin: Skin is warm and dry. She is not diaphoretic.  Psychiatric: She has a normal mood and affect. Her behavior is normal.  Nursing note and vitals reviewed.    ED Treatments / Results  Labs (all labs ordered are listed, but only abnormal results are displayed) Labs Reviewed - No data to display  EKG  EKG Interpretation None       Radiology No results found.  Procedures .Nerve Block Date/Time: 09/28/2015 2:54 PM Performed by: Mattie Marlin L Authorized by: Mattie Marlin L   Consent:    Consent obtained:  Verbal   Consent given by:  Patient   Risks discussed:  Allergic reaction, infection and unsuccessful block Indications:    Indications:  Pain relief and procedural anesthesia Location:    Body area:  Upper extremity   Upper extremity nerve:  Metacarpal   Laterality:  Left Pre-procedure details:    Skin preparation:  Povidone-iodine   Preparation: Patient was prepped and draped in usual sterile fashion   Skin anesthesia (see MAR for exact dosages):    Skin anesthesia method:  None Procedure details (see MAR for exact dosages):    Block needle gauge:  25  G   Anesthetic injected:  Lidocaine 1% w/o epi   Injection procedure:  Anatomic landmarks identified, incremental injection and negative aspiration for blood   Paresthesia:  Immediately resolved Post-procedure details:    Dressing:  None   Outcome:  Pain relieved   Patient tolerance of procedure:  Tolerated well, no immediate complications Drain paronychia Date/Time: 09/28/2015 2:56 PM Performed by: Rhona Raider, Maliah Pyles L Authorized by: Mattie Marlin L  Consent: Verbal consent obtained. Risks and benefits: risks, benefits and alternatives were discussed Consent given by: patient Patient understanding: patient states understanding of the procedure being  performed Required items: required blood products, implants, devices, and special equipment available Patient identity confirmed: verbally with patient and arm band Time out: Immediately prior to procedure a "time out" was called to verify the correct patient, procedure, equipment, support staff and site/side marked as required. Preparation: Patient was prepped and draped in the usual sterile fashion. Local anesthesia used: yes Anesthesia: digital block  Anesthesia: Local anesthesia used: yes Local Anesthetic: lidocaine 1% without epinephrine Anesthetic total: 3 mL  Sedation: Patient sedated: no Patient tolerance: Patient tolerated the procedure well with no immediate complications    (including critical care time)  Medications Ordered in ED Medications  HYDROcodone-acetaminophen (NORCO/VICODIN) 5-325 MG per tablet 1 tablet (1 tablet Oral Given 09/28/15 1455)  lidocaine (PF) (XYLOCAINE) 1 % injection 30 mL (30 mLs Infiltration Given by Other 09/28/15 1503)     Initial Impression / Assessment and Plan / ED Course  I have reviewed the triage vital signs and the nursing notes.  Pertinent labs & imaging results that were available during my care of the patient were reviewed by me and considered in my medical decision making (see chart  for details).  Clinical Course   Patient with paronychia. Performed a digital block prior to incision and drainage of the paronychia. Presentation not concerning for felon. Patient tolerated procedure well. Patient without diabetes or comorbidity to decrease healing so no indictation for antibiotic at this time. Instructed patient to follow up with her primary care provider within 2 days to have her finger reevaluated if symptoms are not improving. Discussed strict return precautions to the ED to include signs of reinfection. Patient expressed understanding to the discharge instructions.  Final Clinical Impressions(s) / ED Diagnoses   Final diagnoses:  Paronychia of finger of left hand    New Prescriptions Discharge Medication List as of 09/28/2015  4:12 PM       Jerre Simon, PA 09/28/15 1719    Marily Memos, MD 09/30/15 [id number]

## 2015-09-28 NOTE — ED Triage Notes (Signed)
Woke up yesterday with pain to left hand.  Middle finger to left swollen and red and pain shooting to hand.  Rates pain 10/10. Denies injury.

## 2015-09-28 NOTE — Discharge Instructions (Signed)
Follow up with your primary care provider in 3-4 days if your symptoms do not improve. Keep your finger clean and dry.   Return to the emergency department if you experience fevers, chills, your finger swells with increased redness and pain or any other concerning symptoms.

## 2015-09-28 NOTE — ED Notes (Signed)
Pt pacing, apologized to patient regarding wait time. Informed patient that PA would be in to see her as soon as she could.

## 2015-10-26 ENCOUNTER — Ambulatory Visit: Payer: Medicare Other | Admitting: Neurology

## 2015-11-10 ENCOUNTER — Ambulatory Visit (INDEPENDENT_AMBULATORY_CARE_PROVIDER_SITE_OTHER): Payer: Medicare Other | Admitting: Neurology

## 2015-11-10 ENCOUNTER — Encounter: Payer: Self-pay | Admitting: Neurology

## 2015-11-10 VITALS — BP 130/80 | Ht 64.5 in | Wt 251.4 lb

## 2015-11-10 DIAGNOSIS — G5603 Carpal tunnel syndrome, bilateral upper limbs: Secondary | ICD-10-CM | POA: Diagnosis not present

## 2015-11-10 DIAGNOSIS — M25561 Pain in right knee: Secondary | ICD-10-CM | POA: Diagnosis not present

## 2015-11-10 DIAGNOSIS — R52 Pain, unspecified: Secondary | ICD-10-CM | POA: Diagnosis not present
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
## 2015-12-25 ENCOUNTER — Other Ambulatory Visit (HOSPITAL_COMMUNITY): Payer: Self-pay | Admitting: Family Medicine

## 2015-12-25 DIAGNOSIS — M545 Low back pain: Secondary | ICD-10-CM

## 2015-12-29 ENCOUNTER — Ambulatory Visit (HOSPITAL_COMMUNITY)
Admission: RE | Admit: 2015-12-29 | Discharge: 2015-12-29 | Disposition: A | Discharge status: Home / Self Care | Payer: Medicare Other | Source: Ambulatory Visit | Attending: Family Medicine | Admitting: Family Medicine

## 2015-12-29 ENCOUNTER — Encounter (HOSPITAL_COMMUNITY): Payer: Self-pay

## 2015-12-29 ENCOUNTER — Other Ambulatory Visit (HOSPITAL_COMMUNITY): Payer: Self-pay | Admitting: Family Medicine

## 2015-12-29 DIAGNOSIS — Z1231 Encounter for screening mammogram for malignant neoplasm of breast: Secondary | ICD-10-CM

## 2015-12-29 DIAGNOSIS — M545 Low back pain: Secondary | ICD-10-CM

## 2016-01-04 ENCOUNTER — Ambulatory Visit (HOSPITAL_COMMUNITY)
Admission: RE | Admit: 2016-01-04 | Discharge: 2016-01-04 | Disposition: A | Discharge status: Home / Self Care | Payer: Medicare Other | Source: Ambulatory Visit | Attending: Family Medicine | Admitting: Family Medicine

## 2016-01-04 DIAGNOSIS — M5440 Lumbago with sciatica, unspecified side: Secondary | ICD-10-CM | POA: Diagnosis present

## 2016-01-04 DIAGNOSIS — M5126 Other intervertebral disc displacement, lumbar region: Secondary | ICD-10-CM | POA: Insufficient documentation

## 2016-01-04 DIAGNOSIS — M5136 Other intervertebral disc degeneration, lumbar region: Secondary | ICD-10-CM | POA: Diagnosis not present

## 2016-01-05 ENCOUNTER — Other Ambulatory Visit (HOSPITAL_COMMUNITY): Payer: Self-pay | Admitting: Family Medicine

## 2016-01-05 DIAGNOSIS — M5440 Lumbago with sciatica, unspecified side: Secondary | ICD-10-CM

## 2016-02-09 DIAGNOSIS — M549 Dorsalgia, unspecified: Secondary | ICD-10-CM | POA: Insufficient documentation

## 2016-02-09 DIAGNOSIS — M4727 Other spondylosis with radiculopathy, lumbosacral region: Secondary | ICD-10-CM | POA: Insufficient documentation

## 2016-02-27 DIAGNOSIS — M5416 Radiculopathy, lumbar region: Secondary | ICD-10-CM | POA: Insufficient documentation

## 2016-02-27 DIAGNOSIS — M5136 Other intervertebral disc degeneration, lumbar region: Secondary | ICD-10-CM | POA: Insufficient documentation

## 2016-05-23 ENCOUNTER — Other Ambulatory Visit (HOSPITAL_COMMUNITY): Payer: Self-pay | Admitting: Family Medicine

## 2016-05-23 ENCOUNTER — Ambulatory Visit (HOSPITAL_COMMUNITY)
Admission: RE | Admit: 2016-05-23 | Discharge: 2016-05-23 | Disposition: A | Discharge status: Home / Self Care | Payer: Medicare Other | Source: Ambulatory Visit | Attending: Family Medicine | Admitting: Family Medicine

## 2016-05-23 DIAGNOSIS — R52 Pain, unspecified: Secondary | ICD-10-CM

## 2016-05-23 DIAGNOSIS — M25561 Pain in right knee: Secondary | ICD-10-CM | POA: Diagnosis not present

## 2016-05-23 DIAGNOSIS — M25562 Pain in left knee: Secondary | ICD-10-CM | POA: Diagnosis not present

## 2016-05-23 DIAGNOSIS — G8929 Other chronic pain: Secondary | ICD-10-CM | POA: Insufficient documentation

## 2016-11-14 DIAGNOSIS — M7061 Trochanteric bursitis, right hip: Secondary | ICD-10-CM | POA: Insufficient documentation

## 2016-11-14 DIAGNOSIS — R2681 Unsteadiness on feet: Secondary | ICD-10-CM | POA: Insufficient documentation

## 2016-11-19 ENCOUNTER — Other Ambulatory Visit (HOSPITAL_COMMUNITY): Payer: Self-pay | Admitting: Physician Assistant

## 2016-11-19 DIAGNOSIS — M4727 Other spondylosis with radiculopathy, lumbosacral region: Secondary | ICD-10-CM

## 2016-11-20 ENCOUNTER — Ambulatory Visit (HOSPITAL_COMMUNITY)
Admission: RE | Admit: 2016-11-20 | Discharge: 2016-11-20 | Disposition: A | Discharge status: Home / Self Care | Payer: Medicare Other | Source: Ambulatory Visit | Attending: Physician Assistant | Admitting: Physician Assistant

## 2016-11-20 DIAGNOSIS — M47816 Spondylosis without myelopathy or radiculopathy, lumbar region: Secondary | ICD-10-CM | POA: Diagnosis not present

## 2016-11-20 DIAGNOSIS — M48061 Spinal stenosis, lumbar region without neurogenic claudication: Secondary | ICD-10-CM | POA: Diagnosis not present

## 2016-11-20 DIAGNOSIS — M545 Low back pain: Secondary | ICD-10-CM | POA: Diagnosis present

## 2016-11-20 DIAGNOSIS — M5126 Other intervertebral disc displacement, lumbar region: Secondary | ICD-10-CM | POA: Insufficient documentation

## 2016-11-20 DIAGNOSIS — M4727 Other spondylosis with radiculopathy, lumbosacral region: Secondary | ICD-10-CM

## 2017-07-21 ENCOUNTER — Other Ambulatory Visit (HOSPITAL_COMMUNITY): Payer: Self-pay | Admitting: Family Medicine

## 2017-07-21 ENCOUNTER — Ambulatory Visit (HOSPITAL_COMMUNITY)
Admission: RE | Admit: 2017-07-21 | Discharge: 2017-07-21 | Disposition: A | Discharge status: Home / Self Care | Payer: Medicare Other | Source: Ambulatory Visit | Attending: Family Medicine | Admitting: Family Medicine

## 2017-07-21 DIAGNOSIS — Z1231 Encounter for screening mammogram for malignant neoplasm of breast: Secondary | ICD-10-CM | POA: Diagnosis not present

## 2018-04-20 ENCOUNTER — Encounter: Payer: Self-pay | Admitting: Internal Medicine

## 2018-06-25 ENCOUNTER — Telehealth: Payer: Self-pay | Admitting: *Deleted

## 2018-06-25 ENCOUNTER — Encounter: Payer: Self-pay | Admitting: Gastroenterology

## 2018-06-25 ENCOUNTER — Ambulatory Visit: Payer: Medicare Other | Admitting: Gastroenterology

## 2018-06-25 ENCOUNTER — Other Ambulatory Visit: Payer: Self-pay

## 2018-06-25 NOTE — Progress Notes (Deleted)
   Referring Provider: Dr. Janna Arch Primary Care Physician:  Oval Linsey, MD  Primary GI: Dr. Jena Gauss   Virtual Visit via Telephone Note Due to COVID-19, visit is conducted virtually and was requested by patient.   I connected with [name] on 06/25/18 at  8:00 AM EDT by telephone and verified that I am speaking with the correct person using two identifiers.   I discussed the limitations, risks, security and privacy concerns of performing an evaluation and management service by telephone and the availability of in person appointments. I also discussed with the patient that there may be a patient responsible charge related to this service. The patient expressed understanding and agreed to proceed.  Chief Complaint  Patient presents with  . Colonoscopy    Constipation     History of Present Illness: 53 year old female presenting to arrange screening colonoscopy at the request of Dr. Janna Arch.   Past Medical History:  Diagnosis Date  . DDD (degenerative disc disease), lumbar   . Hypertension      Past Surgical History:  Procedure Laterality Date  . arm surgery    . CARPAL TUNNEL RELEASE Right   . PARTIAL HYSTERECTOMY       Current Meds  Medication Sig  . albuterol (PROVENTIL HFA;VENTOLIN HFA) 108 (90 BASE) MCG/ACT inhaler Inhale 2 puffs into the lungs every 6 (six) hours as needed for wheezing or shortness of breath.  Marland Kitchen amLODipine (NORVASC) 5 MG tablet Take 5 mg by mouth daily.  Marland Kitchen aspirin EC 81 MG tablet Take 81 mg by mouth daily.  . citalopram (CELEXA) 20 MG tablet Take 30 mg by mouth daily.   . cloNIDine (CATAPRES) 0.2 MG tablet Take 0.2 mg by mouth 2 (two) times daily.  Marland Kitchen gabapentin (NEURONTIN) 300 MG capsule Take 1,200 mg by mouth 3 (three) times daily.   Marland Kitchen HYDROcodone-acetaminophen (NORCO/VICODIN) 5-325 MG tablet Take 1 tablet by mouth every 6 (six) hours as needed for moderate pain.  Marland Kitchen labetalol (NORMODYNE) 200 MG tablet Take 200 mg by mouth 2 (two) times  daily.  . naproxen (NAPROSYN) 500 MG tablet   . topiramate (TOPAMAX) 200 MG tablet Take 200 mg by mouth daily.      Review of Systems: Gen: Denies fever, chills, anorexia. Denies fatigue, weakness, weight loss.  CV: Denies chest pain, palpitations, syncope, peripheral edema, and claudication. Resp: Denies dyspnea at rest, cough, wheezing, coughing up blood, and pleurisy. GI: see HPI Derm: Denies rash, itching, dry skin Psych: Denies depression, anxiety, memory loss, confusion. No homicidal or suicidal ideation.  Heme: Denies bruising, bleeding, and enlarged lymph nodes.  Observations/Objective: No distress. Unable to perform physical exam due to telephone encounter. No video available.   Assessment and Plan:   Follow Up Instructions:    I discussed the assessment and treatment plan with the patient. The patient was provided an opportunity to ask questions and all were answered. The patient agreed with the plan and demonstrated an understanding of the instructions.   The patient was advised to call back or seek an in-person evaluation if the symptoms worsen or if the condition fails to improve as anticipated.  I provided *** minutes of non-face-to-face time during this encounter.  Gelene Mink, PhD, ANP-BC [facility]

## 2018-06-25 NOTE — Telephone Encounter (Signed)
Please send letter to reschedule at her convenience. We attempted the visit, but she did not want to complete at that time.

## 2018-06-25 NOTE — Telephone Encounter (Signed)
I called pt and got all the medical information needed to start her telephone visit.  Pt requested to move the visit to 2:00.  Informed pt that we didn't have an opening at that time.  Pt informed that Tobi Bastos would reach out to her shortly by text to start the video chat after we complete this part of the visit.  Pt said that she may or may not be available.  Pt said that she would re-schedule if she was unable to proceed with our visit.  Pt did not respond to Anna's text so I called pt to follow up but received her voicemail.  Unable to leave a message because the voicemail was full.

## 2018-06-29 ENCOUNTER — Encounter: Payer: Self-pay | Admitting: Internal Medicine

## 2018-06-29 DIAGNOSIS — Z1211 Encounter for screening for malignant neoplasm of colon: Secondary | ICD-10-CM | POA: Insufficient documentation

## 2018-06-29 NOTE — Progress Notes (Signed)
See phone note. Opened in error.

## 2018-06-29 NOTE — Telephone Encounter (Signed)
MAILED LETTER TO PATIENT.

## 2018-08-26 ENCOUNTER — Other Ambulatory Visit (HOSPITAL_COMMUNITY): Payer: Self-pay | Admitting: Family Medicine

## 2018-08-26 DIAGNOSIS — Z1231 Encounter for screening mammogram for malignant neoplasm of breast: Secondary | ICD-10-CM

## 2018-11-03 ENCOUNTER — Encounter: Payer: Self-pay | Admitting: Orthopaedic Surgery

## 2018-11-03 ENCOUNTER — Ambulatory Visit (INDEPENDENT_AMBULATORY_CARE_PROVIDER_SITE_OTHER): Payer: Medicare Other | Admitting: Orthopaedic Surgery

## 2018-11-03 DIAGNOSIS — B070 Plantar wart: Secondary | ICD-10-CM

## 2018-11-03 NOTE — Progress Notes (Signed)
Office Visit Note   Patient: [name]           Date of Birth: 1965/08/28           MRN: [id number] Visit Date: 11/03/2018              Requested by: Oval Linsey, MD [address] [place],  [place] [address] PCP: Oval Linsey, MD   Assessment & Plan: Visit Diagnoses:  1. Plantar warts     Plan: We will refer her to dermatology for treatment of plantar warts bilateral feet if she continues to have pain after treatment of plantar warts then she can always follow-up with [place].  Otherwise follow-up as needed.  Follow-Up Instructions: Return if symptoms worsen or fail to improve.   Orders:  No orders of the defined types were placed in this encounter.  No orders of the defined types were placed in this encounter.     Procedures: No procedures performed   Clinical Data: No additional findings.   Subjective: Chief Complaint  Patient presents with  . Left Foot - Pain  . Right Foot - Pain    HPI Patient is a 53 year old female were seen for the first time for pain for bumps on the bottom of both feet.  She states she cannot wear tennis shoes due to the pain.  She does report a history of infection involving the right great toe sounds like an infection in involving the toenail.  She reports no fevers or chills.  She is had no injury to either foot. Review of Systems Please see HPI otherwise negative or noncontributory.  Objective: Vital Signs: There were no vitals taken for this visit.  Physical Exam General: Well-developed well-nourished female no acute distress mood and affect appropriate. Psych alert and oriented x3. Ortho Exam Bilateral feet dorsal pedal pulses are intact.  She has multiple plantar warts on both feet at least 5 or 6 on the left foot and 5 or 6 on the right foot.  These are very painful in the touch.  Most of them has significant callus formation.  There is no gross signs of infection of either foot. Specialty Comments:  No specialty  comments available.  Imaging: No results found.   PMFS History: Patient Active Problem List   Diagnosis Date Noted  . Encounter for screening colonoscopy 06/29/2018  . COPD with acute exacerbation (HCC) 09/28/2014  . Chest pain 09/28/2014  . Hyperglycemia 09/28/2014  . Tachycardia 09/28/2014  . COPD exacerbation (HCC) 09/28/2014  . MARIJUANA ABUSE 06/29/2007  . PANIC ATTACK, ACUTE 06/05/2007  . TOBACCO ABUSE 03/04/2007  . ANEMIA-IRON DEFICIENCY 02/17/2007  . DEPRESSION 02/17/2007  . Essential hypertension 02/17/2007  . ARM PAIN, LEFT 02/17/2007   Past Medical History:  Diagnosis Date  . DDD (degenerative disc disease), lumbar   . Hypertension     Family History  Problem Relation Age of Onset  . Pancreatic cancer Father        living  . Diabetes Father   . Diabetes Mother   . Lung cancer Mother   . Stroke Paternal Grandmother   . Diabetes Brother        Deceased  . Stroke Maternal Grandmother   . Healthy Son     Past Surgical History:  Procedure Laterality Date  . arm surgery    . CARPAL TUNNEL RELEASE Right   . PARTIAL HYSTERECTOMY     Social History   Occupational History  . Not on file  Tobacco  Use  . Smoking status: Current Every Day Smoker    Packs/day: 0.50    Years: 32.00    Pack years: 16.00    Types: Cigarettes  . Smokeless tobacco: Never Used  Substance and Sexual Activity  . Alcohol use: No    Alcohol/week: 0.0 standard drinks  . Drug use: No  . Sexual activity: Not on file

## 2018-11-04 ENCOUNTER — Other Ambulatory Visit: Payer: Self-pay

## 2018-11-04 DIAGNOSIS — B070 Plantar wart: Secondary | ICD-10-CM

## 2018-11-11 ENCOUNTER — Telehealth: Payer: Self-pay | Admitting: Orthopaedic Surgery

## 2018-11-11 NOTE — Telephone Encounter (Signed)
Patient called stating that she has not heard from the dermatologist yet.  CB#[phone].  Thank you

## 2018-11-11 NOTE — Telephone Encounter (Signed)
Can you see/tell me the status of this?

## 2018-11-11 NOTE — Telephone Encounter (Signed)
I had accidentally deferred the referral, my fault completely.  See below- no further needed at this time.    IC pt and I have her scheduled this Friday at [facility].  Dr Jamse [place], 940 am arrive 930 am.  She says she cannot make that appt, she will call them to reschedule.

## 2019-01-01 ENCOUNTER — Ambulatory Visit (HOSPITAL_COMMUNITY)
Admission: RE | Admit: 2019-01-01 | Discharge: 2019-01-01 | Disposition: A | Discharge status: Home / Self Care | Payer: Medicare Other | Source: Ambulatory Visit | Attending: Family Medicine | Admitting: Family Medicine

## 2019-01-01 ENCOUNTER — Other Ambulatory Visit: Payer: Self-pay

## 2019-01-01 ENCOUNTER — Other Ambulatory Visit (HOSPITAL_COMMUNITY): Payer: Self-pay | Admitting: Family Medicine

## 2019-01-01 DIAGNOSIS — M25561 Pain in right knee: Secondary | ICD-10-CM | POA: Insufficient documentation

## 2019-01-30 ENCOUNTER — Other Ambulatory Visit: Payer: Self-pay

## 2019-01-30 ENCOUNTER — Emergency Department (HOSPITAL_COMMUNITY): Payer: Medicare Other

## 2019-01-30 ENCOUNTER — Emergency Department (HOSPITAL_COMMUNITY)
Admission: EM | Admit: 2019-01-30 | Discharge: 2019-01-30 | Disposition: A | Discharge status: Home / Self Care | Payer: Medicare Other | Attending: Emergency Medicine | Admitting: Emergency Medicine

## 2019-01-30 ENCOUNTER — Encounter (HOSPITAL_COMMUNITY): Payer: Self-pay | Admitting: Emergency Medicine

## 2019-01-30 DIAGNOSIS — Z5321 Procedure and treatment not carried out due to patient leaving prior to being seen by health care provider: Secondary | ICD-10-CM | POA: Diagnosis not present

## 2019-01-30 DIAGNOSIS — R05 Cough: Secondary | ICD-10-CM | POA: Insufficient documentation

## 2019-01-30 DIAGNOSIS — R509 Fever, unspecified: Secondary | ICD-10-CM | POA: Insufficient documentation

## 2019-01-30 NOTE — ED Notes (Signed)
Pt not in waiting room at this time.

## 2019-01-30 NOTE — ED Triage Notes (Signed)
Patient c/o productive cough with thick green, grey sputum, sore throat, and fever x1 week. Patient taking mucinex, theraflu, and tylenol. Patient last took thera-flu today at 12pm.

## 2019-03-04 ENCOUNTER — Ambulatory Visit (HOSPITAL_COMMUNITY)
Admission: RE | Admit: 2019-03-04 | Discharge: 2019-03-04 | Disposition: A | Discharge status: Home / Self Care | Payer: Medicare Other | Source: Ambulatory Visit | Attending: Family Medicine | Admitting: Family Medicine

## 2019-03-04 ENCOUNTER — Other Ambulatory Visit: Payer: Self-pay

## 2019-03-04 ENCOUNTER — Inpatient Hospital Stay (HOSPITAL_COMMUNITY): Admission: RE | Admit: 2019-03-04 | Payer: Medicare Other | Source: Ambulatory Visit

## 2019-03-04 DIAGNOSIS — Z1231 Encounter for screening mammogram for malignant neoplasm of breast: Secondary | ICD-10-CM | POA: Insufficient documentation

## 2019-03-09 ENCOUNTER — Other Ambulatory Visit: Payer: Medicare Other | Admitting: Obstetrics & Gynecology

## 2019-03-09 ENCOUNTER — Other Ambulatory Visit (HOSPITAL_COMMUNITY): Payer: Self-pay | Admitting: Family Medicine

## 2019-03-09 DIAGNOSIS — N644 Mastodynia: Secondary | ICD-10-CM

## 2019-03-12 ENCOUNTER — Encounter: Payer: Self-pay | Admitting: Obstetrics & Gynecology

## 2019-03-12 ENCOUNTER — Other Ambulatory Visit: Payer: Self-pay

## 2019-03-12 ENCOUNTER — Ambulatory Visit (INDEPENDENT_AMBULATORY_CARE_PROVIDER_SITE_OTHER): Payer: Medicare Other | Admitting: Obstetrics & Gynecology

## 2019-03-12 VITALS — BP 178/102 | HR 69 | Ht 64.5 in | Wt 209.0 lb

## 2019-03-12 DIAGNOSIS — Z01419 Encounter for gynecological examination (general) (routine) without abnormal findings: Secondary | ICD-10-CM | POA: Diagnosis not present

## 2019-03-12 DIAGNOSIS — Z1212 Encounter for screening for malignant neoplasm of rectum: Secondary | ICD-10-CM

## 2019-03-12 DIAGNOSIS — Z1211 Encounter for screening for malignant neoplasm of colon: Secondary | ICD-10-CM
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
## 2019-03-16 ENCOUNTER — Encounter (HOSPITAL_COMMUNITY): Payer: Medicare Other

## 2019-03-16 ENCOUNTER — Other Ambulatory Visit: Payer: Self-pay

## 2019-03-16 ENCOUNTER — Ambulatory Visit (HOSPITAL_COMMUNITY): Admission: RE | Admit: 2019-03-16 | Payer: Medicare Other | Source: Ambulatory Visit

## 2019-03-16 ENCOUNTER — Ambulatory Visit (HOSPITAL_COMMUNITY)
Admission: RE | Admit: 2019-03-16 | Discharge: 2019-03-16 | Disposition: A | Discharge status: Home / Self Care | Payer: Medicare Other | Source: Ambulatory Visit | Attending: Family Medicine | Admitting: Family Medicine

## 2019-03-16 DIAGNOSIS — N644 Mastodynia: Secondary | ICD-10-CM | POA: Insufficient documentation

## 2019-11-30 ENCOUNTER — Other Ambulatory Visit: Payer: Self-pay

## 2019-11-30 ENCOUNTER — Other Ambulatory Visit (HOSPITAL_COMMUNITY): Payer: Self-pay | Admitting: Family Medicine

## 2019-11-30 ENCOUNTER — Ambulatory Visit (HOSPITAL_COMMUNITY)
Admission: RE | Admit: 2019-11-30 | Discharge: 2019-11-30 | Disposition: A | Discharge status: Home / Self Care | Payer: Medicare Other | Source: Ambulatory Visit | Attending: Family Medicine | Admitting: Family Medicine

## 2019-11-30 DIAGNOSIS — M19071 Primary osteoarthritis, right ankle and foot: Secondary | ICD-10-CM | POA: Diagnosis not present

## 2019-11-30 DIAGNOSIS — M19072 Primary osteoarthritis, left ankle and foot: Secondary | ICD-10-CM

## 2019-12-15 ENCOUNTER — Encounter: Payer: Self-pay | Admitting: Internal Medicine
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
## 2020-01-24 ENCOUNTER — Telehealth: Payer: Self-pay | Admitting: Gastroenterology

## 2020-01-24 ENCOUNTER — Other Ambulatory Visit: Payer: Self-pay

## 2020-01-24 ENCOUNTER — Encounter: Payer: Self-pay | Admitting: Gastroenterology

## 2020-01-24 ENCOUNTER — Ambulatory Visit (INDEPENDENT_AMBULATORY_CARE_PROVIDER_SITE_OTHER): Payer: Medicare Other | Admitting: Gastroenterology

## 2020-01-24 VITALS — BP 165/95 | HR 77 | Temp 97.7°F | Ht 64.5 in | Wt 218.4 lb

## 2020-01-24 DIAGNOSIS — R1310 Dysphagia, unspecified: Secondary | ICD-10-CM | POA: Diagnosis not present

## 2020-01-24 DIAGNOSIS — Z791 Long term (current) use of non-steroidal anti-inflammatories (NSAID): Secondary | ICD-10-CM

## 2020-01-24 DIAGNOSIS — R6881 Early satiety: Secondary | ICD-10-CM | POA: Insufficient documentation

## 2020-01-24 DIAGNOSIS — Z1211 Encounter for screening for malignant neoplasm of colon: Secondary | ICD-10-CM

## 2020-01-24 DIAGNOSIS — K5900 Constipation, unspecified: Secondary | ICD-10-CM

## 2020-01-24 MED ORDER — PANTOPRAZOLE SODIUM 40 MG PO TBEC: 40.0000 mg | DELAYED_RELEASE_TABLET | Freq: Every day | ORAL | 3 refills | Status: DC

## 2020-01-24 NOTE — Progress Notes (Signed)
CC'ED TO PCP 

## 2020-01-24 NOTE — Assessment & Plan Note (Addendum)
54 year old female with chronic history of constipation with BMs once every 2-3 weeks.  Not currently taking any medications for bowel regularity.  No abdominal pain. Single episode of low volume hematochezia about 5 months ago. Notably, she is due for colonoscopy.  Per patient, last colonoscopy over 10 years ago without polyps.  Plan: Start MiraLAX 1 capful (17 g) daily in 8 ounces of water. Drink enough water to keep urine clear. Call with progress report in 2 weeks.  If MiraLAX is not working well, will try a prescriptive agent. Follow-up in 6-8 weeks to discuss scheduling colonoscopy.

## 2020-01-24 NOTE — Patient Instructions (Addendum)
Please start MiraLAX 1 capful (17 g) daily in 8 ounces of water.  Drink enough water to keep your urine pale yellow to clear.  Start Protonix 40 mg daily 30 minutes before breakfast.  I have sent a prescription to your pharmacy.  Try to limit naproxen as much as possible and avoid all other NSAIDs including ibuprofen, Aleve, Advil, Goody powders, aspirin powders, and anything that says "NSAID" on the package.  For your trouble swallowing: All meats should be chopped finely. Avoid tough textures. Eat slowly, take small bites, chew thoroughly, and drink plenty of liquids throughout your meals. Should something get hung in her esophagus and not come up or go down, you should proceed to the emergency room.  We will plan to see you back in 6-8 weeks to discuss scheduling colonoscopy and upper endoscopy.  *Please call with a progress report on constipation in 2 weeks.  If MiraLAX is not working well, we can try a prescriptive agent.  It was nice meeting you today!  Hope you have a great Christmas!  Ermalinda Memos, PA-C [facility]

## 2020-01-24 NOTE — Assessment & Plan Note (Addendum)
54 year old female presenting today to schedule screening colonoscopy.  Reports last colonoscopy was over 10 years ago without polyps.  Lower GI symptoms include chronic history of constipation with BMs once every 2-3 weeks.  Notes a single episode of low volume hematochezia about 5 months ago.  No known hemorrhoids or rectal pain.  No family history of colon cancer.  Patient needs colonoscopy in the near future or screening purposes. Suspect rectal bleeding was likely secondary to benign anorectal source such as hemorrhoids in the setting of constipation.  Due to significant constipation, will focus on better management of this and circle back to colonoscopy in the near future.  Plan: Start MiraLAX 1 capful (17 g) daily in ounces of water. Drink enough water to keep urine pale yellow to clear. Requested progress report in 1-2 weeks.  If MiraLAX is not working well, we will try a prescriptive agent. Follow-up in 6-8 weeks to schedule procedures.

## 2020-01-24 NOTE — Assessment & Plan Note (Addendum)
Intermittent sensation of solid food items getting hung in the substernal chest area requiring regurgitation x6+ months.  Denies any routine GERD symptoms.    Differentials include esophageal web, ring, stricture, EOE, and less likely malignancy.  She could also have atypical GERD contributing.  She will need EGD in the near future.  Due to desire to have EGD and colonoscopy together, we are holding off on arranging procedures while we address constipation over the next few weeks.  Plan: All meats should be chopped finely. Avoid tough textures.  Eat slowly, take small bites, chew thoroughly, and drink plenty of liquids throughout meals. Start Protonix 40 mg daily 30 minutes before breakfast due to early satiety and chronic NSAID use as discussed below. If something were to get hung in her esophagus and not come up or go down, advised that she proceed to the emergency room.  Follow-up in 6-8 weeks to arrange procedures.

## 2020-01-24 NOTE — Telephone Encounter (Signed)
Darl Pikes, can you request colonoscopy records from [facility]?  Patient reports colonoscopy at [facility] greater than 10 years ago.

## 2020-01-24 NOTE — Assessment & Plan Note (Addendum)
Intermittent sensation of early satiety x1 year.  States she can drink water or tea all day with no food consumption and feels full.  Denies associated abdominal pain.  Intermittent nausea with vomiting which seems to be most closely associated with foods getting stuck in the substernal chest area.  Notably, she has been taking naproxen daily for at least a year and has never been on any sort of acid suppression medication.  Weight has been stable over the last year.  No melena.  Query whether she may have gastritis or duodenitis contributing to early satiety.  Due to dysphagia, she is going to need EGD in the near future.  She is also due for colonoscopy.  We are holding off on scheduling procedures for a few weeks while we are addressing constipation.  For now, I will start her on Protonix 40 mg daily 30 minutes before breakfast and plan to follow-up in 6-8 weeks to schedule procedures.  Also advised limit naproxen as much as possible and avoid all other NSAIDs.

## 2020-01-25 NOTE — Telephone Encounter (Signed)
requested

## 2020-01-30 NOTE — Telephone Encounter (Signed)
In response to requesting colonoscopy report for this patient from [facility], we received dcumentation stating, "After a thorough and extensive search, it was found that the patient above does not have any medical records maintained by [facility] [facility] or the specified information requested.  No colonoscopy or pathology."

## 2020-03-22 ENCOUNTER — Other Ambulatory Visit: Payer: Self-pay | Admitting: Gastroenterology

## 2020-03-22 DIAGNOSIS — R6881 Early satiety: Secondary | ICD-10-CM

## 2020-03-22 DIAGNOSIS — Z791 Long term (current) use of non-steroidal anti-inflammatories (NSAID): Secondary | ICD-10-CM
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
## 2020-03-30 ENCOUNTER — Ambulatory Visit: Payer: Medicare Other | Admitting: Gastroenterology

## 2020-04-28 ENCOUNTER — Other Ambulatory Visit: Payer: Self-pay

## 2020-04-28 ENCOUNTER — Encounter (HOSPITAL_COMMUNITY): Payer: Self-pay | Admitting: Emergency Medicine

## 2020-04-28 ENCOUNTER — Emergency Department (HOSPITAL_COMMUNITY)
Admission: EM | Admit: 2020-04-28 | Discharge: 2020-05-02 | Disposition: A | Discharge status: Home / Self Care | Payer: Medicare Other | Attending: Emergency Medicine | Admitting: Emergency Medicine

## 2020-04-28 DIAGNOSIS — I10 Essential (primary) hypertension: Secondary | ICD-10-CM | POA: Insufficient documentation

## 2020-04-28 DIAGNOSIS — Z79899 Other long term (current) drug therapy: Secondary | ICD-10-CM | POA: Diagnosis not present

## 2020-04-28 DIAGNOSIS — J441 Chronic obstructive pulmonary disease with (acute) exacerbation: Secondary | ICD-10-CM | POA: Diagnosis not present

## 2020-04-28 DIAGNOSIS — X828XXA Other intentional self-harm by crashing of motor vehicle, initial encounter: Secondary | ICD-10-CM | POA: Insufficient documentation

## 2020-04-28 DIAGNOSIS — R4781 Slurred speech: Secondary | ICD-10-CM | POA: Diagnosis not present

## 2020-04-28 DIAGNOSIS — F17210 Nicotine dependence, cigarettes, uncomplicated: Secondary | ICD-10-CM | POA: Insufficient documentation

## 2020-04-28 DIAGNOSIS — F314 Bipolar disorder, current episode depressed, severe, without psychotic features: Secondary | ICD-10-CM | POA: Diagnosis not present

## 2020-04-28 DIAGNOSIS — F10929 Alcohol use, unspecified with intoxication, unspecified: Secondary | ICD-10-CM

## 2020-04-28 DIAGNOSIS — F1020 Alcohol dependence, uncomplicated: Secondary | ICD-10-CM | POA: Diagnosis not present

## 2020-04-28 DIAGNOSIS — Z20822 Contact with and (suspected) exposure to covid-19: Secondary | ICD-10-CM | POA: Insufficient documentation

## 2020-04-28 DIAGNOSIS — Y92410 Unspecified street and highway as the place of occurrence of the external cause: Secondary | ICD-10-CM | POA: Diagnosis not present

## 2020-04-28 DIAGNOSIS — Z7982 Long term (current) use of aspirin: Secondary | ICD-10-CM | POA: Insufficient documentation

## 2020-04-28 DIAGNOSIS — R45851 Suicidal ideations: Secondary | ICD-10-CM | POA: Diagnosis not present

## 2020-04-28 DIAGNOSIS — T1491XA Suicide attempt, initial encounter: Secondary | ICD-10-CM

## 2020-04-28 DIAGNOSIS — F4310 Post-traumatic stress disorder, unspecified: Secondary | ICD-10-CM | POA: Diagnosis not present

## 2020-04-28 LAB — COMPREHENSIVE METABOLIC PANEL
ALT: 16 U/L (ref 0–44)
AST: 19 U/L (ref 15–41)
Albumin: 4.6 g/dL (ref 3.5–5.0)
Alkaline Phosphatase: 102 U/L (ref 38–126)
Anion gap: 10 (ref 5–15)
BUN: 17 mg/dL (ref 6–20)
CO2: 21 mmol/L — ABNORMAL LOW (ref 22–32)
Calcium: 9 mg/dL (ref 8.9–10.3)
Chloride: 108 mmol/L (ref 98–111)
Creatinine, Ser: 1.25 mg/dL — ABNORMAL HIGH (ref 0.44–1.00)
GFR, Estimated: 51 mL/min — ABNORMAL LOW (ref >60)
Glucose, Bld: 124 mg/dL — ABNORMAL HIGH (ref 70–99)
Potassium: 3.7 mmol/L (ref 3.5–5.1)
Sodium: 139 mmol/L (ref 135–145)
Total Bilirubin: 0.5 mg/dL (ref 0.3–1.2)
Total Protein: 7.7 g/dL (ref 6.5–8.1)

## 2020-04-28 LAB — RAPID URINE DRUG SCREEN, HOSP PERFORMED
Amphetamines: NOT DETECTED
Barbiturates: NOT DETECTED
Benzodiazepines: POSITIVE — AB
Cocaine: NOT DETECTED
Opiates: POSITIVE — AB
Tetrahydrocannabinol: POSITIVE — AB

## 2020-04-28 LAB — CBC WITH DIFFERENTIAL/PLATELET
Abs Immature Granulocytes: 0.02 10*3/uL (ref 0.00–0.07)
Basophils Absolute: 0 10*3/uL (ref 0.0–0.1)
Basophils Relative: 1 %
Eosinophils Absolute: 0 10*3/uL (ref 0.0–0.5)
Eosinophils Relative: 0 %
HCT: 47.1 % — ABNORMAL HIGH (ref 36.0–46.0)
Hemoglobin: 14.5 g/dL (ref 12.0–15.0)
Immature Granulocytes: 0 %
Lymphocytes Relative: 42 %
Lymphs Abs: 2.4 10*3/uL (ref 0.7–4.0)
MCH: 25.5 pg — ABNORMAL LOW (ref 26.0–34.0)
MCHC: 30.8 g/dL (ref 30.0–36.0)
MCV: 82.9 fL (ref 80.0–100.0)
Monocytes Absolute: 0.2 10*3/uL (ref 0.1–1.0)
Monocytes Relative: 4 %
Neutro Abs: 3 10*3/uL (ref 1.7–7.7)
Neutrophils Relative %: 53 %
Platelets: 406 10*3/uL — ABNORMAL HIGH (ref 150–400)
RBC: 5.68 MIL/uL — ABNORMAL HIGH (ref 3.87–5.11)
RDW: 19.8 % — ABNORMAL HIGH (ref 11.5–15.5)
WBC: 5.7 10*3/uL (ref 4.0–10.5)
nRBC: 0 % (ref 0.0–0.2)

## 2020-04-28 LAB — CBG MONITORING, ED: Glucose-Capillary: 107 mg/dL — ABNORMAL HIGH (ref 70–99)

## 2020-04-28 LAB — ACETAMINOPHEN LEVEL: Acetaminophen (Tylenol), Serum: 28 ug/mL (ref 10–30)

## 2020-04-28 LAB — SALICYLATE LEVEL: Salicylate Lvl: <7 mg/dL — ABNORMAL LOW (ref 7.0–30.0)

## 2020-04-28 LAB — ETHANOL: Alcohol, Ethyl (B): 103 mg/dL — ABNORMAL HIGH (ref <10)

## 2020-04-28 MED ORDER — TOPIRAMATE 25 MG PO TABS
200.0000 mg | ORAL_TABLET | Freq: Every day | ORAL | Status: DC
  Administered 2020-04-29 – 2020-05-01 (×3): 200 mg via ORAL
  Filled 2020-04-28 (×3): qty 8

## 2020-04-28 MED ORDER — AMLODIPINE BESYLATE 5 MG PO TABS
5.0000 mg | ORAL_TABLET | Freq: Every day | ORAL | Status: DC
  Administered 2020-04-29 – 2020-04-30 (×2): 5 mg via ORAL
  Filled 2020-04-28 (×2): qty 1

## 2020-04-28 MED ORDER — CLONIDINE HCL 0.2 MG PO TABS
0.2000 mg | ORAL_TABLET | Freq: Two times a day (BID) | ORAL | Status: DC
  Administered 2020-04-29 – 2020-05-01 (×7): 0.2 mg via ORAL
  Filled 2020-04-28 (×7): qty 1

## 2020-04-28 MED ORDER — ASPIRIN EC 81 MG PO TBEC
81.0000 mg | DELAYED_RELEASE_TABLET | Freq: Every day | ORAL | Status: DC
  Administered 2020-04-29 – 2020-05-01 (×3): 81 mg via ORAL
  Filled 2020-04-28 (×3): qty 1

## 2020-04-28 MED ORDER — VITAMIN D 25 MCG (1000 UNIT) PO TABS
2000.0000 [IU] | ORAL_TABLET | Freq: Every day | ORAL | Status: DC
  Administered 2020-04-29 – 2020-04-30 (×2): 2000 [IU] via ORAL
  Filled 2020-04-28 (×3): qty 2

## 2020-04-28 MED ORDER — QUETIAPINE FUMARATE 100 MG PO TABS
400.0000 mg | ORAL_TABLET | Freq: Every day | ORAL | Status: DC
  Administered 2020-04-29 – 2020-05-01 (×4): 400 mg via ORAL
  Filled 2020-04-28 (×3): qty 4

## 2020-04-28 MED ORDER — PANTOPRAZOLE SODIUM 40 MG PO TBEC
40.0000 mg | DELAYED_RELEASE_TABLET | Freq: Every day | ORAL | Status: DC
  Administered 2020-04-29 – 2020-05-01 (×3): 40 mg via ORAL
  Filled 2020-04-28 (×3): qty 1

## 2020-04-28 MED ORDER — LABETALOL HCL 200 MG PO TABS
400.0000 mg | ORAL_TABLET | Freq: Three times a day (TID) | ORAL | Status: DC
  Administered 2020-04-29 – 2020-05-01 (×10): 400 mg via ORAL
  Filled 2020-04-28 (×9): qty 2

## 2020-04-28 MED ORDER — SACUBITRIL-VALSARTAN 49-51 MG PO TABS
1.0000 | ORAL_TABLET | Freq: Every day | ORAL | Status: DC
  Administered 2020-04-29 – 2020-04-30 (×2): 1 via ORAL
  Filled 2020-04-28 (×2): qty 1

## 2020-04-28 MED ORDER — ONDANSETRON HCL 4 MG PO TABS: 4.0000 mg | ORAL_TABLET | Freq: Three times a day (TID) | ORAL | Status: DC | PRN

## 2020-04-28 NOTE — ED Notes (Signed)
Pt sitting in corner of room on floor at this time.

## 2020-04-28 NOTE — ED Notes (Signed)
Patient on phone with husband stating that she was upset with him. Stated to husband when she leaves she is going to hurt herself. " States that death is next on the list, I have a plan. Patient became upset with staff when told she was going to be IVC'D. Call PA to room to talk with patient. Security and [employer] at bedside.
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
## 2020-04-28 NOTE — ED Notes (Signed)
Pt states.  she "did have thoughts of hurting herself when she was drunk, but now I'm sober and I don't".

## 2020-04-28 NOTE — ED Notes (Signed)
Pt is medically cleared and now psych hold

## 2020-04-29 ENCOUNTER — Emergency Department (HOSPITAL_COMMUNITY): Payer: Medicare Other

## 2020-04-29 LAB — ACETAMINOPHEN LEVEL: Acetaminophen (Tylenol), Serum: 20 ug/mL (ref 10–30)

## 2020-04-29 MED ORDER — IBUPROFEN 400 MG PO TABS
400.0000 mg | ORAL_TABLET | Freq: Once | ORAL | Status: AC
  Administered 2020-04-29: 400 mg via ORAL
  Filled 2020-04-29: qty 1

## 2020-04-29 MED ORDER — TRAZODONE HCL 50 MG PO TABS
200.0000 mg | ORAL_TABLET | Freq: Every day | ORAL | Status: DC
  Administered 2020-04-29 – 2020-05-01 (×3): 200 mg via ORAL
  Filled 2020-04-29 (×3): qty 4
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
## 2020-04-29 NOTE — ED Notes (Signed)
Pt given meal tray and water 

## 2020-04-29 NOTE — ED Notes (Signed)
Pt received 3rd phone call from Son .

## 2020-04-29 NOTE — ED Notes (Signed)
Pt verbalized irritation and showing some attitude toward sitter. Nurse spoke with pt explained she has to wear a mask when out of the room, there is only 3 phone calls a day and she has used two thus far, phone calls doesn't;t matter if it is incoming or only certain people, visitation was explained, explained how IVC must wait for staff on the tv for discharge not ER Dr.

## 2020-04-29 NOTE — ED Notes (Signed)
[name] called upset stating staff is giving him the run around and hanging up on him. Nurse explained his phone  My have been losing signal as it has while they have been talking. Nurse explained pt is IVC'd not just in the ED, nurse explained rules for those patients and got the son to tell her what he understood about the information given.

## 2020-04-29 NOTE — ED Notes (Signed)
Pt refused to eat meal tray at this time.

## 2020-04-29 NOTE — ED Notes (Signed)
Poison Control not called on previous shift.  Dr Kirtland Bouchard. [place] informed.  MD review chart, EKG.
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
## 2020-04-29 NOTE — ED Notes (Signed)
Pt has had TTS

## 2020-04-30 MED ORDER — AMLODIPINE BESYLATE 5 MG PO TABS
10.0000 mg | ORAL_TABLET | Freq: Every day | ORAL | Status: DC
  Administered 2020-05-01: 10 mg via ORAL
  Filled 2020-04-30: qty 2

## 2020-04-30 MED ORDER — SACUBITRIL-VALSARTAN 97-103 MG PO TABS
1.0000 | ORAL_TABLET | Freq: Two times a day (BID) | ORAL | Status: DC
  Administered 2020-04-30 – 2020-05-01 (×3): 1 via ORAL
  Filled 2020-04-30 (×3): qty 1

## 2020-04-30 MED ORDER — VITAMIN D 25 MCG (1000 UNIT) PO TABS
5000.0000 [IU] | ORAL_TABLET | Freq: Every day | ORAL | Status: DC
  Administered 2020-05-01: 5000 [IU] via ORAL
  Filled 2020-04-30: qty 5

## 2020-04-30 MED ORDER — ZIPRASIDONE MESYLATE 20 MG IM SOLR
20.0000 mg | Freq: Once | INTRAMUSCULAR | Status: AC
  Administered 2020-04-30: 20 mg via INTRAMUSCULAR
  Filled 2020-04-30: qty 20

## 2020-04-30 MED ORDER — CARIPRAZINE HCL 1.5 MG PO CAPS
3.0000 mg | ORAL_CAPSULE | Freq: Every day | ORAL | Status: DC
  Administered 2020-04-30 – 2020-05-01 (×2): 3 mg via ORAL
  Filled 2020-04-30 (×2): qty 2

## 2020-04-30 NOTE — Progress Notes (Signed)
Patient meets criteria for inpatient treatment. There are no beds available at [facility] today.  CSW faxed referrals to the following facilities for review:  [facility] [address]  TTS will continue to seek bed placement.   Trula Slade, MSW, LCSW Clinical Social Worker 04/30/2020 8:51 AM

## 2020-04-30 NOTE — ED Notes (Signed)
Resting quietly, will hold Geodon at this time.

## 2020-04-30 NOTE — ED Notes (Signed)
Pt is requesting all her belongings to go with son [name] [phone].  Security Vikki Ports) reviewed belongings with pt.  Pt is also requesting her son take clothing (black purse and clothes) belongings as well.  Pt has reviewed belongings with staff and son, is satisfied.

## 2020-04-30 NOTE — ED Notes (Signed)
Pt talking with best friend on the phone.

## 2020-04-30 NOTE — ED Notes (Signed)
Housekeeping able to clean room with out issues from pt.

## 2020-04-30 NOTE — ED Notes (Signed)
Pt asleep, TTS to called back later.

## 2020-04-30 NOTE — ED Notes (Signed)
Pt aware she qualifies for inpatient

## 2020-04-30 NOTE — ED Notes (Signed)
Pt appears to be resting comfortably at this time. Will assess vitals at a later time

## 2020-04-30 NOTE — ED Notes (Signed)
Pt is on first call with her husband.

## 2020-04-30 NOTE — ED Notes (Signed)
EDP assess pt

## 2020-04-30 NOTE — ED Notes (Signed)
pt is throwing side table in room.  Pt is being going into pts room across the hall so the other pt was moved from her visual site. Pt says she has been asking since 5 am for shower and waiting for shower to be cleaned.  Shower was clean and pt is still cussing and saying that the floor is dirty.  Pt is asking for hot water to "I want to wash my pussy and my ass."  I informed pt that she will not be getting hot water to put in basing.  Pt then pull off her shirt and expose her breast in hall.  MD informed and security with on duty Officer.

## 2020-04-30 NOTE — ED Notes (Signed)
Pt talking to her husband on the phone

## 2020-04-30 NOTE — ED Notes (Signed)
Pt remains asleep, will give meds once awake.

## 2020-04-30 NOTE — ED Notes (Signed)
Pt washed up at sink in room.  Pt is calm and cleaning room.  Law Eenforcement called off to sit with pt.

## 2020-04-30 NOTE — ED Notes (Signed)
Pt requesting a shot for her bursitis, EDP aware.
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
## 2020-05-01 LAB — RESP PANEL BY RT-PCR (FLU A&B, COVID) ARPGX2
Influenza A by PCR: NEGATIVE
Influenza B by PCR: NEGATIVE
SARS Coronavirus 2 by RT PCR: NEGATIVE

## 2020-05-01 MED ORDER — MAGNESIUM HYDROXIDE 400 MG/5ML PO SUSP
15.0000 mL | Freq: Once | ORAL | Status: AC
  Administered 2020-05-01: 15 mL via ORAL
  Filled 2020-05-01: qty 30

## 2020-05-01 MED ORDER — IBUPROFEN 400 MG PO TABS
400.0000 mg | ORAL_TABLET | Freq: Four times a day (QID) | ORAL | Status: DC | PRN
  Administered 2020-05-01: 400 mg via ORAL
  Filled 2020-05-01: qty 1

## 2020-05-01 NOTE — ED Notes (Signed)
Per SW: this patient was accepted to [facility] tomorrow after 0800, attending is Dr. Estill Cotta, report to [phone]
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
## 2020-05-01 NOTE — Progress Notes (Signed)
Patient information has been sent to [facility] via secure chat to review for potential admission. Patient meets inpatient criteria per Melbourne Abts, PA-C.     Situation ongoing, CSW will continue to monitor progress.        Signed:  Wyvonnia Lora, LCSWA  05/01/2020 10:12 AM

## 2020-05-01 NOTE — Progress Notes (Signed)
Pt accepted to [facility] PENDING NEGATIVE COVID.  Patient meets inpatient criteria per Melbourne Abts, NP.  Dr. Estill Cotta is the attending provider.    Call report to [phone]  Tarry Kos, RN and Okey Dupre, RN @ [facility] notified.     Pt is IVC.    Pt may be transported by [facility]   Pt scheduled  to arrive at [facility] after [phone]   Signed:  Corky Crafts, MSW, [place], Bridget Hartshorn 05/01/2020 3:56 PM

## 2020-05-01 NOTE — Progress Notes (Signed)
Per [facility], facility is at capacity due to staffing shortage. Disposition CSW to refer patient out at this time.   Pt. meets criteria for inpatient treatment per Melbourne Abts, NP. Referred out to the following hospitals:   Destination  Service Provider Address Phone [facility]  [address] [place] [place] [address] [phone]  [facility]  [address] [place] [address] [phone]  [facility]  [address] [place] [address] [phone]  [facility]  [address], [place] [address] [phone]  [facility]  [address] [place], [place] [address] [phone]  [facility]  [address] [place] [address] [phone]  [facility]  [address], [place] [address] [phone]  [facility]  [address], [place] [address] [phone]  [facility]  [address], [place] [address] [phone]  [facility]  [address] [place] [address] [phone]  [facility]  [address], [place] [address] [phone]  [facility]  [address] [place] [address] [phone]  [facility]  [address], [place] [address] [phone]  [facility]  [address] [place] [address] [phone]     Disposition CSW will continue to follow for placement.  Signed:  Corky Crafts, MSW, [place], LCASA 05/01/2020 3:06 PM

## 2020-05-01 NOTE — ED Notes (Signed)
Pt ambulated to nurses station stating that she wanted to use another bathroom other than the one designated for psych patients. When asked why, pt stated that her plantar warts start hurting because of the floors. However, pt wanted to walk a lot further to another bathroom, potentially hurting her feet even more. I informed pt that I could give her another pair of socks to help cushion her feet, but she refused. She then stated, "well just give me a cardboard basin, I'll pee in that." I informed pt that she can use the designated bathroom, but I would not be giving her a basin to pee in because I felt that was inappropriate. Pt then walked back to her room and stated, "fine, I have one in my room that I'll pee in."

## 2020-05-01 NOTE — ED Notes (Signed)
Pt provided with 2 cups of ice water. No other requests at this time. Will continue to monitor.

## 2020-05-02 NOTE — ED Notes (Signed)
This Consulting civil engineer called to speak with Alecia Lemming who was the anticipated receiving RN of this pt. Charge RN asked what concerns there were for not taking report on this pt on the way to them considering their MD had already accepted the pt. Alecia Lemming stated, "I'm not going to have my name on the EMTALA form when I didn't receive report on the patient before they left". Charge RN agreed that she understood this concern. Alecia Lemming got very upset and said he "wasn't going to continue discussing this and wasn't going to get into an argument over it" and attempted to hang up the phone. Charge RN made Alecia Lemming aware that no argument was trying to be started and that we were trying to understand his concerns and how to move forward with this patient's care. Charge RN asked Alecia Lemming if they were going to refuse to take this patient once they arrived at their facility. Alecia Lemming reported no that they were "going to do what's best for the patient" and still accept them. Charge RN asked if once the pt arrived could we then call nursing report and Alecia Lemming replied with "yes". Alecia Lemming was told to call us upon patient's arrival and we would have our nurse give report. Charge RN will follow up with Vernard Gambles, [facility] RN, to ensure report is called once pt has arrived at [facility].

## 2020-05-02 NOTE — ED Notes (Addendum)
Attempted to call report to [facility], but Kathlene November instructed me that the nurse didn't come in until 0700, so to try to call report after 0700.

## 2020-05-02 NOTE — ED Notes (Signed)
Attempted to call report to [facility]. This nurse refuses to take report at this time d/t the patient already leaving our facility w/ [facility]. Alecia Lemming states I can talk to his boss, which I do request  To do so. Call was forwarded to a voicemail. Charge nurse here at [facility] made aware.

## 2020-05-02 NOTE — ED Notes (Signed)
Attempted to call report again to [facility]. Secretary on the phone stated that the nurse victor could not take report from me at that time and he would have to call me back. Number given to [facility] for return call. They did state that this pt had arrived earlier in the day.

## 2020-05-02 NOTE — ED Notes (Signed)
Pt left facility w/ [facility].

## 2020-05-02 NOTE — ED Provider Notes (Signed)
She has been accepted at [facility] by Dr. Loyola Mast.   Dione Booze, MD 05/02/20 [phone]
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
## 2020-05-08 ENCOUNTER — Ambulatory Visit: Payer: Medicare Other | Admitting: Gastroenterology

## 2020-05-08 ENCOUNTER — Encounter: Payer: Self-pay | Admitting: Internal Medicine

## 2020-06-20 ENCOUNTER — Other Ambulatory Visit: Payer: Self-pay | Admitting: Gastroenterology

## 2020-06-20 DIAGNOSIS — R6881 Early satiety: Secondary | ICD-10-CM

## 2020-06-20 DIAGNOSIS — Z791 Long term (current) use of non-steroidal anti-inflammatories (NSAID): Secondary | ICD-10-CM

## 2021-05-17 ENCOUNTER — Inpatient Hospital Stay (HOSPITAL_COMMUNITY): Payer: Medicare Other

## 2021-05-17 ENCOUNTER — Other Ambulatory Visit: Payer: Self-pay

## 2021-05-17 ENCOUNTER — Encounter (HOSPITAL_COMMUNITY): Payer: Self-pay | Admitting: Emergency Medicine

## 2021-05-17 ENCOUNTER — Inpatient Hospital Stay (HOSPITAL_COMMUNITY)
Admission: EM | Admit: 2021-05-17 | Discharge: 2021-05-19 | DRG: 291 | Disposition: A | Discharge status: Home / Self Care | Payer: Medicare Other | Attending: Family Medicine | Admitting: Family Medicine

## 2021-05-17 ENCOUNTER — Emergency Department (HOSPITAL_COMMUNITY): Payer: Medicare Other

## 2021-05-17 DIAGNOSIS — Z20822 Contact with and (suspected) exposure to covid-19: Secondary | ICD-10-CM | POA: Diagnosis present

## 2021-05-17 DIAGNOSIS — R0602 Shortness of breath: Principal | ICD-10-CM

## 2021-05-17 DIAGNOSIS — F17210 Nicotine dependence, cigarettes, uncomplicated: Secondary | ICD-10-CM | POA: Diagnosis present

## 2021-05-17 DIAGNOSIS — Z801 Family history of malignant neoplasm of trachea, bronchus and lung: Secondary | ICD-10-CM | POA: Diagnosis not present

## 2021-05-17 DIAGNOSIS — I501 Left ventricular failure: Secondary | ICD-10-CM | POA: Diagnosis present

## 2021-05-17 DIAGNOSIS — Z8042 Family history of malignant neoplasm of prostate: Secondary | ICD-10-CM

## 2021-05-17 DIAGNOSIS — I130 Hypertensive heart and chronic kidney disease with heart failure and stage 1 through stage 4 chronic kidney disease, or unspecified chronic kidney disease: Secondary | ICD-10-CM | POA: Diagnosis not present

## 2021-05-17 DIAGNOSIS — R0601 Orthopnea: Secondary | ICD-10-CM

## 2021-05-17 DIAGNOSIS — E669 Obesity, unspecified: Secondary | ICD-10-CM | POA: Diagnosis present

## 2021-05-17 DIAGNOSIS — Z6838 Body mass index (BMI) 38.0-38.9, adult: Secondary | ICD-10-CM | POA: Diagnosis not present

## 2021-05-17 DIAGNOSIS — I160 Hypertensive urgency: Secondary | ICD-10-CM | POA: Diagnosis present

## 2021-05-17 DIAGNOSIS — F17200 Nicotine dependence, unspecified, uncomplicated: Secondary | ICD-10-CM | POA: Diagnosis not present

## 2021-05-17 DIAGNOSIS — J449 Chronic obstructive pulmonary disease, unspecified: Secondary | ICD-10-CM | POA: Diagnosis not present

## 2021-05-17 DIAGNOSIS — F319 Bipolar disorder, unspecified: Secondary | ICD-10-CM | POA: Diagnosis present

## 2021-05-17 DIAGNOSIS — D509 Iron deficiency anemia, unspecified: Secondary | ICD-10-CM | POA: Diagnosis present

## 2021-05-17 DIAGNOSIS — N179 Acute kidney failure, unspecified: Secondary | ICD-10-CM | POA: Diagnosis present

## 2021-05-17 DIAGNOSIS — Z91048 Other nonmedicinal substance allergy status: Secondary | ICD-10-CM | POA: Diagnosis not present

## 2021-05-17 DIAGNOSIS — I5031 Acute diastolic (congestive) heart failure: Secondary | ICD-10-CM | POA: Diagnosis present

## 2021-05-17 DIAGNOSIS — J811 Chronic pulmonary edema: Secondary | ICD-10-CM | POA: Diagnosis present

## 2021-05-17 DIAGNOSIS — I10 Essential (primary) hypertension: Secondary | ICD-10-CM

## 2021-05-17 DIAGNOSIS — N1831 Chronic kidney disease, stage 3a: Secondary | ICD-10-CM | POA: Diagnosis present

## 2021-05-17 DIAGNOSIS — Z823 Family history of stroke: Secondary | ICD-10-CM | POA: Diagnosis not present

## 2021-05-17 DIAGNOSIS — F1210 Cannabis abuse, uncomplicated: Secondary | ICD-10-CM | POA: Diagnosis present

## 2021-05-17 DIAGNOSIS — Z800 Family history of malignant neoplasm of digestive organs: Secondary | ICD-10-CM | POA: Diagnosis not present

## 2021-05-17 DIAGNOSIS — R739 Hyperglycemia, unspecified: Secondary | ICD-10-CM | POA: Diagnosis present

## 2021-05-17 DIAGNOSIS — Z7982 Long term (current) use of aspirin: Secondary | ICD-10-CM

## 2021-05-17 DIAGNOSIS — Z79899 Other long term (current) drug therapy: Secondary | ICD-10-CM | POA: Diagnosis not present

## 2021-05-17 DIAGNOSIS — F4310 Post-traumatic stress disorder, unspecified: Secondary | ICD-10-CM | POA: Diagnosis present

## 2021-05-17 DIAGNOSIS — R7303 Prediabetes: Secondary | ICD-10-CM | POA: Diagnosis present

## 2021-05-17 DIAGNOSIS — I509 Heart failure, unspecified: Secondary | ICD-10-CM | POA: Diagnosis not present

## 2021-05-17 DIAGNOSIS — Z833 Family history of diabetes mellitus: Secondary | ICD-10-CM | POA: Diagnosis not present

## 2021-05-17 DIAGNOSIS — G4733 Obstructive sleep apnea (adult) (pediatric): Secondary | ICD-10-CM

## 2021-05-17 LAB — RETICULOCYTES
Immature Retic Fract: 18.1 % — ABNORMAL HIGH (ref 2.3–15.9)
RBC.: 4.35 MIL/uL (ref 3.87–5.11)
Retic Count, Absolute: 68.7 10*3/uL (ref 19.0–186.0)
Retic Ct Pct: 1.6 % (ref 0.4–3.1)

## 2021-05-17 LAB — CBC
HCT: 36.2 % (ref 36.0–46.0)
Hemoglobin: 10.8 g/dL — ABNORMAL LOW (ref 12.0–15.0)
MCH: 24.2 pg — ABNORMAL LOW (ref 26.0–34.0)
MCHC: 29.8 g/dL — ABNORMAL LOW (ref 30.0–36.0)
MCV: 81.2 fL (ref 80.0–100.0)
Platelets: 320 10*3/uL (ref 150–400)
RBC: 4.46 MIL/uL (ref 3.87–5.11)
RDW: 17.4 % — ABNORMAL HIGH (ref 11.5–15.5)
WBC: 4 10*3/uL (ref 4.0–10.5)
nRBC: 0 % (ref 0.0–0.2)

## 2021-05-17 LAB — ECHOCARDIOGRAM COMPLETE
AR max vel: 3.21 cm2
AV Area VTI: 3.02 cm2
AV Area mean vel: 3.17 cm2
AV Mean grad: 3 mmHg
AV Peak grad: 6.3 mmHg
Ao pk vel: 1.25 m/s
Area-P 1/2: 5.2 cm2
Calc EF: 60.3 %
Height: 64.5 in
MV VTI: 3.05 cm2
S' Lateral: 3.9 cm
Single Plane A2C EF: 69 %
Single Plane A4C EF: 58.2 %
Weight: 3680 oz

## 2021-05-17 LAB — RAPID URINE DRUG SCREEN, HOSP PERFORMED
Amphetamines: NOT DETECTED
Barbiturates: NOT DETECTED
Benzodiazepines: NOT DETECTED
Cocaine: NOT DETECTED
Opiates: NOT DETECTED
Tetrahydrocannabinol: POSITIVE — AB

## 2021-05-17 LAB — COMPREHENSIVE METABOLIC PANEL
ALT: 17 U/L (ref 0–44)
AST: 19 U/L (ref 15–41)
Albumin: 3.6 g/dL (ref 3.5–5.0)
Alkaline Phosphatase: 81 U/L (ref 38–126)
Anion gap: 6 (ref 5–15)
BUN: 23 mg/dL — ABNORMAL HIGH (ref 6–20)
CO2: 21 mmol/L — ABNORMAL LOW (ref 22–32)
Calcium: 8.1 mg/dL — ABNORMAL LOW (ref 8.9–10.3)
Chloride: 112 mmol/L — ABNORMAL HIGH (ref 98–111)
Creatinine, Ser: 1.69 mg/dL — ABNORMAL HIGH (ref 0.44–1.00)
GFR, Estimated: 35 mL/min — ABNORMAL LOW (ref >60)
Glucose, Bld: 159 mg/dL — ABNORMAL HIGH (ref 70–99)
Potassium: 3.7 mmol/L (ref 3.5–5.1)
Sodium: 139 mmol/L (ref 135–145)
Total Bilirubin: 0.3 mg/dL (ref 0.3–1.2)
Total Protein: 6.4 g/dL — ABNORMAL LOW (ref 6.5–8.1)

## 2021-05-17 LAB — FERRITIN: Ferritin: 8 ng/mL — ABNORMAL LOW (ref 11–307)

## 2021-05-17 LAB — HEMOGLOBIN A1C
Hgb A1c MFr Bld: 5.9 % — ABNORMAL HIGH (ref 4.8–5.6)
Mean Plasma Glucose: 122.63 mg/dL

## 2021-05-17 LAB — IRON AND TIBC
Iron: 26 ug/dL — ABNORMAL LOW (ref 28–170)
Saturation Ratios: 5 % — ABNORMAL LOW (ref 10.4–31.8)
TIBC: 539 ug/dL — ABNORMAL HIGH (ref 250–450)
UIBC: 513 ug/dL

## 2021-05-17 LAB — HIV ANTIBODY (ROUTINE TESTING W REFLEX): HIV Screen 4th Generation wRfx: NONREACTIVE

## 2021-05-17 LAB — GLUCOSE, CAPILLARY
Glucose-Capillary: 210 mg/dL — ABNORMAL HIGH (ref 70–99)
Glucose-Capillary: 169 mg/dL — ABNORMAL HIGH (ref 70–99)
Glucose-Capillary: 185 mg/dL — ABNORMAL HIGH (ref 70–99)
Glucose-Capillary: 105 mg/dL — ABNORMAL HIGH (ref 70–99)

## 2021-05-17 LAB — D-DIMER, QUANTITATIVE: D-Dimer, Quant: 2.03 ug/mL-FEU — ABNORMAL HIGH (ref 0.00–0.50)

## 2021-05-17 LAB — TROPONIN I (HIGH SENSITIVITY)
Troponin I (High Sensitivity): 14 ng/L (ref <18)
Troponin I (High Sensitivity): 13 ng/L (ref <18)

## 2021-05-17 LAB — RESP PANEL BY RT-PCR (FLU A&B, COVID) ARPGX2
Influenza A by PCR: NEGATIVE
Influenza B by PCR: NEGATIVE
SARS Coronavirus 2 by RT PCR: NEGATIVE

## 2021-05-17 LAB — BRAIN NATRIURETIC PEPTIDE: B Natriuretic Peptide: 356 pg/mL — ABNORMAL HIGH (ref 0.0–100.0)

## 2021-05-17 LAB — VITAMIN B12: Vitamin B-12: 468 pg/mL (ref 180–914)

## 2021-05-17 LAB — FOLATE: Folate: 9.3 ng/mL (ref >5.9)

## 2021-05-17 MED ORDER — INSULIN ASPART 100 UNIT/ML IJ SOLN
4.0000 [IU] | Freq: Three times a day (TID) | INTRAMUSCULAR | Status: DC
  Administered 2021-05-17 – 2021-05-19 (×6): 4 [IU] via SUBCUTANEOUS

## 2021-05-17 MED ORDER — PREDNISONE 50 MG PO TABS
60.0000 mg | ORAL_TABLET | Freq: Once | ORAL | Status: AC
  Administered 2021-05-17: 60 mg via ORAL
  Filled 2021-05-17: qty 1

## 2021-05-17 MED ORDER — INSULIN ASPART 100 UNIT/ML IJ SOLN
0.0000 [IU] | Freq: Three times a day (TID) | INTRAMUSCULAR | Status: DC
  Administered 2021-05-17: 3 [IU] via SUBCUTANEOUS
  Administered 2021-05-17: 5 [IU] via SUBCUTANEOUS
  Administered 2021-05-17 – 2021-05-18 (×2): 3 [IU] via SUBCUTANEOUS
  Administered 2021-05-19: 2 [IU] via SUBCUTANEOUS

## 2021-05-17 MED ORDER — ACETAMINOPHEN 325 MG PO TABS
650.0000 mg | ORAL_TABLET | ORAL | Status: DC | PRN
  Administered 2021-05-19: 650 mg via ORAL
  Filled 2021-05-17: qty 2

## 2021-05-17 MED ORDER — ASPIRIN EC 81 MG PO TBEC
81.0000 mg | DELAYED_RELEASE_TABLET | Freq: Every day | ORAL | Status: DC
Start: 2021-05-17 — End: 2021-05-19
  Administered 2021-05-17 – 2021-05-19 (×3): 81 mg via ORAL
  Filled 2021-05-17 (×3): qty 1

## 2021-05-17 MED ORDER — LORAZEPAM 2 MG/ML IJ SOLN
0.5000 mg | Freq: Once | INTRAMUSCULAR | Status: AC
Start: 2021-05-18 — End: 2021-05-17
  Administered 2021-05-17: 0.5 mg via INTRAVENOUS
  Filled 2021-05-17: qty 1

## 2021-05-17 MED ORDER — TRAZODONE HCL 50 MG PO TABS
200.0000 mg | ORAL_TABLET | Freq: Every evening | ORAL | Status: DC | PRN
  Administered 2021-05-17 – 2021-05-18 (×2): 200 mg via ORAL
  Filled 2021-05-17 (×2): qty 4

## 2021-05-17 MED ORDER — FUROSEMIDE 10 MG/ML IJ SOLN
20.0000 mg | Freq: Once | INTRAMUSCULAR | Status: AC
  Administered 2021-05-17: 20 mg via INTRAVENOUS
  Filled 2021-05-17: qty 2

## 2021-05-17 MED ORDER — ENOXAPARIN SODIUM 40 MG/0.4ML IJ SOSY
40.0000 mg | PREFILLED_SYRINGE | INTRAMUSCULAR | Status: DC
  Administered 2021-05-18 – 2021-05-19 (×2): 40 mg via SUBCUTANEOUS
  Filled 2021-05-17 (×3): qty 0.4

## 2021-05-17 MED ORDER — IPRATROPIUM-ALBUTEROL 0.5-2.5 (3) MG/3ML IN SOLN
3.0000 mL | Freq: Once | RESPIRATORY_TRACT | Status: AC
Start: 2021-05-17 — End: 2021-05-17
  Administered 2021-05-17: 3 mL via RESPIRATORY_TRACT
  Filled 2021-05-17: qty 3

## 2021-05-17 MED ORDER — CLONIDINE HCL 0.1 MG PO TABS
0.1000 mg | ORAL_TABLET | Freq: Two times a day (BID) | ORAL | Status: DC
  Administered 2021-05-17 (×2): 0.1 mg via ORAL
  Filled 2021-05-17 (×2): qty 1

## 2021-05-17 MED ORDER — NICOTINE 21 MG/24HR TD PT24
21.0000 mg | MEDICATED_PATCH | Freq: Every day | TRANSDERMAL | Status: DC
  Administered 2021-05-17 – 2021-05-19 (×3): 21 mg via TRANSDERMAL
  Filled 2021-05-17 (×3): qty 1

## 2021-05-17 MED ORDER — TECHNETIUM TO 99M ALBUMIN AGGREGATED
4.0000 | Freq: Once | INTRAVENOUS | Status: AC | PRN
  Administered 2021-05-17: 4.4 via INTRAVENOUS

## 2021-05-17 MED ORDER — SODIUM CHLORIDE 0.9 % IV SOLN: 250.0000 mL | INTRAVENOUS | Status: DC | PRN

## 2021-05-17 MED ORDER — VITAMIN D 25 MCG (1000 UNIT) PO TABS
2000.0000 [IU] | ORAL_TABLET | Freq: Every day | ORAL | Status: DC
  Administered 2021-05-17 – 2021-05-19 (×3): 2000 [IU] via ORAL
  Filled 2021-05-17 (×3): qty 2

## 2021-05-17 MED ORDER — SODIUM CHLORIDE 0.9% FLUSH: 3.0000 mL | INTRAVENOUS | Status: DC | PRN

## 2021-05-17 MED ORDER — HYDROCODONE-ACETAMINOPHEN 5-325 MG PO TABS
1.0000 | ORAL_TABLET | Freq: Four times a day (QID) | ORAL | Status: DC | PRN
  Administered 2021-05-17: 1 via ORAL
  Administered 2021-05-18 – 2021-05-19 (×4): 2 via ORAL
  Filled 2021-05-17: qty 2
  Filled 2021-05-17 (×2): qty 1
  Filled 2021-05-17 (×2): qty 2
  Filled 2021-05-17: qty 1

## 2021-05-17 MED ORDER — LIVING BETTER WITH HEART FAILURE BOOK: Freq: Once | Status: AC

## 2021-05-17 MED ORDER — IPRATROPIUM-ALBUTEROL 0.5-2.5 (3) MG/3ML IN SOLN
3.0000 mL | Freq: Three times a day (TID) | RESPIRATORY_TRACT | Status: DC
  Administered 2021-05-17 – 2021-05-19 (×7): 3 mL via RESPIRATORY_TRACT
  Filled 2021-05-17 (×7): qty 3

## 2021-05-17 MED ORDER — SODIUM CHLORIDE 0.9% FLUSH
3.0000 mL | Freq: Two times a day (BID) | INTRAVENOUS | Status: DC
  Administered 2021-05-17 – 2021-05-19 (×4): 3 mL via INTRAVENOUS

## 2021-05-17 MED ORDER — ONDANSETRON HCL 4 MG/2ML IJ SOLN: 4.0000 mg | Freq: Four times a day (QID) | INTRAMUSCULAR | Status: DC | PRN

## 2021-05-17 MED ORDER — QUETIAPINE FUMARATE 100 MG PO TABS
200.0000 mg | ORAL_TABLET | Freq: Every day | ORAL | Status: DC
  Administered 2021-05-17 – 2021-05-18 (×2): 200 mg via ORAL
  Filled 2021-05-17 (×2): qty 2

## 2021-05-17 MED ORDER — AMLODIPINE BESYLATE 5 MG PO TABS
5.0000 mg | ORAL_TABLET | Freq: Every day | ORAL | Status: DC
  Administered 2021-05-17: 5 mg via ORAL
  Filled 2021-05-17: qty 1

## 2021-05-17 MED ORDER — LABETALOL HCL 200 MG PO TABS
200.0000 mg | ORAL_TABLET | Freq: Three times a day (TID) | ORAL | Status: DC
  Administered 2021-05-17 (×3): 200 mg via ORAL
  Filled 2021-05-17 (×3): qty 1

## 2021-05-17 MED ORDER — TOPIRAMATE 100 MG PO TABS
200.0000 mg | ORAL_TABLET | Freq: Every day | ORAL | Status: DC
  Administered 2021-05-17 – 2021-05-19 (×3): 200 mg via ORAL
  Filled 2021-05-17 (×3): qty 2

## 2021-05-17 MED ORDER — FUROSEMIDE 10 MG/ML IJ SOLN
40.0000 mg | Freq: Two times a day (BID) | INTRAMUSCULAR | Status: DC
  Administered 2021-05-17 – 2021-05-19 (×4): 40 mg via INTRAVENOUS
  Filled 2021-05-17 (×4): qty 4

## 2021-05-17 NOTE — Assessment & Plan Note (Signed)
--   See orders to resume home blood pressure medications ?

## 2021-05-17 NOTE — Assessment & Plan Note (Signed)
-   Resume home behavioral health medications 

## 2021-05-17 NOTE — Hospital Course (Addendum)
56 year old female with hypertension, chronic back pain, bipolar disorder, prediabetes, PTSD and anxiety disorder who is a longtime smoker presented to the emergency department with complaints of persistent dyspnea.  She reports she has been having episodes of intermittent dyspnea and chest pain for the past 2 months.  She reports that she has not seen a PCP in over 1 year but has been taking some medications due to refills.  She had been seen by Dr. Cindie Laroche prior to his retirement.  She is a longtime smoker and continues to smoke 1/2 packs/day.  She reports that over the past 2 months she has noticed increased edema in her legs.  She reports that she took her husband's Lasix tablets starting 2 days ago and has been urinating more frequently.  She also reports that she is having difficulty lying recumbent due to severe shortness of breath.  She is having difficulty sleeping at night due to shortness of breath symptoms.  She reports that she has been concerned about the edema in her legs. ? ?Her ED work-up was significant for hypertensive urgency with markedly elevated blood pressures.  She was also noted to have an elevated creatinine of 1.69 BUN 23.  Her glucose was 159.  Her cardiac BNP was elevated at 356 and her chest x-ray showed findings of pulmonary edema.  Her at [facility] troponin testing was negative.  Her influenza and SARS 2 coronavirus testing was negative.  The EKG did not show any acute findings.  She was given a dose of oral prednisone and IV Lasix and admission was requested. ? ?05/18/2021: Pt says she is urinating on IV lasix.  Still having SOB, unable to lie recumbent and unable to sleep.  No CP.   ? ?05/19/2021: Pt feeling better today. Ambulating in room.  No SOB symptoms with ambulation.  DC home today.  Arranging for home nebulizer.  Ambulatory referral for sleep study, cardiology, hypertension.  ?

## 2021-05-17 NOTE — Assessment & Plan Note (Addendum)
--   Pt likely has stage 3a CKD from poorly controlled hypertension  ?-- AKI ruled out  ?-- Working on blood pressure improvements ?

## 2021-05-17 NOTE — Assessment & Plan Note (Addendum)
--   IV Lasix ordered for diuresis ?--Pt feeling better. She is euvolemic. DC home on lasix 40 mg daily.  ?--outpatient referral to cardiology clinic.  ?--encouraged to stop smoking and take meds. ?

## 2021-05-17 NOTE — Assessment & Plan Note (Addendum)
--   Patient reports history of prediabetes mellitus,hemoglobin A1c is 5.9%, carbohydrate modified diet ordered ?-- No concentrated sweets or fruit juices except to treat a low blood glucose ?

## 2021-05-17 NOTE — Progress Notes (Signed)
*  PRELIMINARY RESULTS* ?Echocardiogram ?2D Echocardiogram has been performed. ? ?[name] ?05/17/2021, 1:45 PM ?

## 2021-05-17 NOTE — Assessment & Plan Note (Addendum)
--   Restarted home blood pressure medications as noted ?-- BPs have slowly started to come down.  ?-- pt agreeable to referral to advanced hypertension clinic ?

## 2021-05-17 NOTE — H&P (Signed)
History and Physical  [facility]  Jared Sasek WUJ:[id number] DOB: 06-12-1965 DOA: 05/17/2021  PCP: Pcp, No  Patient coming from: Home  Level of care: Telemetry  I have personally briefly reviewed patient's old medical records in [facility]  Chief Complaint: SOB  HPI: [name] is a 56 year old female with hypertension, chronic back pain, bipolar disorder, prediabetes, PTSD and anxiety disorder who is a longtime smoker presented to the emergency department with complaints of persistent dyspnea.  She reports she has been having episodes of intermittent dyspnea and chest pain for the past 2 months.  She reports that she has not seen a PCP in over 1 year but has been taking some medications due to refills.  She had been seen by Dr. Janna Arch prior to his retirement.  She is a longtime smoker and continues to smoke 1/2 packs/day.  She reports that over the past 2 months she has noticed increased edema in her legs.  She reports that she took her husband's Lasix tablets starting 2 days ago and has been urinating more frequently.  She also reports that she is having difficulty lying recumbent due to severe shortness of breath.  She is having difficulty sleeping at night due to shortness of breath symptoms.  She reports that she has been concerned about the edema in her legs.  Her ED work-up was significant for hypertensive urgency with markedly elevated blood pressures.  She was also noted to have an elevated creatinine of 1.69 BUN 23.  Her glucose was 159.  Her cardiac BNP was elevated at 356 and her chest x-ray showed findings of pulmonary edema.  Her at HS troponin testing was negative.  Her influenza and SARS 2 coronavirus testing was negative.  The EKG did not show any acute findings.  She was given a dose of oral prednisone and IV Lasix and admission was requested.  Review of Systems: Review of Systems  Constitutional:  Positive for malaise/fatigue.  HENT: Negative.    Eyes:  Negative.   Respiratory:  Positive for cough, shortness of breath and wheezing.   Cardiovascular:  Positive for chest pain, orthopnea, leg swelling and PND.  Gastrointestinal:  Positive for heartburn.  Genitourinary: Negative.   Musculoskeletal:  Positive for back pain. Negative for falls and joint pain.  Skin: Negative.   Neurological: Negative.   Endo/Heme/Allergies: Negative.   Psychiatric/Behavioral:  Positive for depression. Negative for suicidal ideas. The patient has insomnia.     Past Medical History:  Diagnosis Date   Anxiety    Bipolar disorder (HCC)    Chronic back pain    DDD (degenerative disc disease), lumbar    Depression    Hypertension    Pre-diabetes    PTSD (post-traumatic stress disorder)     Past Surgical History:  Procedure Laterality Date   arm surgery     right shoulder surgery   COLONOSCOPY     PARTIAL HYSTERECTOMY       reports that she has been smoking cigarettes. She has a 16.00 pack-year smoking history. She has never used smokeless tobacco. She reports current alcohol use. She reports that she does not use drugs.  Allergies  Allergen Reactions   Adhesive [Tape] Rash    Family History  Problem Relation Age of Onset   Pancreatic cancer Father        living   Diabetes Father    Prostate cancer Father    Diabetes Mother    Lung cancer Mother    Stroke  Paternal Grandmother    Diabetes Brother        Deceased   Stroke Maternal Grandmother    Healthy Son    Colon cancer Neg Hx     Prior to Admission medications   Medication Sig Start Date End Date Taking? Authorizing Provider  albuterol (PROVENTIL HFA;VENTOLIN HFA) 108 (90 BASE) MCG/ACT inhaler Inhale 2 puffs into the lungs every 6 (six) hours as needed for wheezing or shortness of breath.    Yes [provider]  amLODipine (NORVASC) 5 MG tablet Take 5 mg by mouth daily. 02/22/15  Yes [provider]  aspirin EC 81 MG tablet Take 81 mg by mouth daily.   Yes [provider]  cholecalciferol (VITAMIN D3) 25 MCG (1000 UNIT) tablet Take 2,000 Units by mouth daily.   Yes [provider]  cloNIDine (CATAPRES) 0.2 MG tablet Take 0.2 mg by mouth 2 (two) times daily.   Yes [provider]  Diclofenac Sodium 3 % GEL Apply 1 application topically daily as needed for pain. 03/05/20  Yes [provider]  ENTRESTO 97-103 MG Take 1 tablet by mouth daily. 04/24/20  Yes [provider]  HYDROcodone-acetaminophen (NORCO/VICODIN) 5-325 MG tablet Take 1 tablet by mouth every 6 (six) hours as needed for moderate pain.   Yes [provider]  labetalol (NORMODYNE) 200 MG tablet Take 400 mg by mouth 3 (three) times daily.    Yes [provider]  naproxen (NAPROSYN) 500 MG tablet Take 500 mg by mouth 2 (two) times daily as needed for mild pain. 10/28/15  Yes [provider]  QUEtiapine (SEROQUEL) 400 MG tablet Take 400 mg by mouth at bedtime.  06/13/14  Yes [provider]  topiramate (TOPAMAX) 200 MG tablet Take 200 mg by mouth daily.    Yes [provider]  traZODone (DESYREL) 100 MG tablet Take 200 mg by mouth at bedtime.  11/02/18  Yes [provider]  VRAYLAR capsule Take 3 mg by mouth daily. 03/21/20  Yes [provider]  pantoprazole (PROTONIX) 40 MG tablet TAKE 1 TABLET BY MOUTH EVERY DAY BEFORE BREAKFAST Patient not taking: Reported on 05/17/2021 06/20/20   Gelene Mink, NP    Physical Exam: Vitals:   05/17/21 0630 05/17/21 0700 05/17/21 0730 05/17/21 0833  BP: (!) 170/119   (!) 182/97  Pulse: 84 88  87  Resp: 20 18  (!) 22  Temp:    97.8 F (36.6 C)  TempSrc:    Oral  SpO2: 96% 99% 98% 98%  Weight:      Height:        Constitutional: obese, awake, cooperative, NAD, calm, appears dyspneic Eyes: PERRL, lids and conjunctivae normal ENMT: Mucous membranes are pale,moist. Posterior pharynx clear of any exudate or lesions.  poor dentition.  Neck: normal, supple, no  masses, no thyromegaly, mild non-tender right submandibular lymph node palpable.  Respiratory: rare expiratory wheezing, bibasilar crackles. Normal respiratory effort. No accessory muscle use.  Cardiovascular: normal s1, s2 sounds, no murmurs / rubs / gallops. 2+ bilateral lower extremity edema. 2+ pedal pulses. No carotid bruits.  Abdomen: no tenderness, no masses palpated. No hepatosplenomegaly. Bowel sounds positive.  Musculoskeletal: no clubbing / cyanosis. No joint deformity upper and lower extremities. Good ROM, no contractures. Normal muscle tone.  Skin: no rashes, lesions, ulcers. No induration Neurologic: CN 2-12 grossly intact. Sensation intact, DTR normal. Strength 5/5 in all 4.  Psychiatric: poor judgment and insight. Alert and oriented x 3. Normal  mood.   Labs on Admission: I have personally reviewed following labs and imaging studies  CBC: Recent Labs  Lab 05/17/21 0410  WBC 4.0  HGB 10.8*  HCT 36.2  MCV 81.2  PLT 320   Basic Metabolic Panel: Recent Labs  Lab 05/17/21 0410  NA 139  K 3.7  CL 112*  CO2 21*  GLUCOSE 159*  BUN 23*  CREATININE 1.69*  CALCIUM 8.1*   GFR: Estimated Creatinine Clearance: 44.2 mL/min (A) (by C-G formula based on SCr of 1.69 mg/dL (H)). Liver Function Tests: Recent Labs  Lab 05/17/21 0410  AST 19  ALT 17  ALKPHOS 81  BILITOT 0.3  PROT 6.4*  ALBUMIN 3.6   No results for input(s): LIPASE, AMYLASE in the last 168 hours. No results for input(s): AMMONIA in the last 168 hours. Coagulation Profile: No results for input(s): INR, PROTIME in the last 168 hours. Cardiac Enzymes: No results for input(s): CKTOTAL, CKMB, CKMBINDEX, TROPONINI in the last 168 hours. BNP (last 3 results) No results for input(s): PROBNP in the last 8760 hours. HbA1C: No results for input(s): HGBA1C in the last 72 hours. CBG: Recent Labs  Lab 05/17/21 0916  GLUCAP 210*   Lipid Profile: No results for input(s): CHOL, HDL, LDLCALC, TRIG, CHOLHDL,  LDLDIRECT in the last 72 hours. Thyroid Function Tests: No results for input(s): TSH, T4TOTAL, FREET4, T3FREE, THYROIDAB in the last 72 hours. Anemia Panel: No results for input(s): VITAMINB12, FOLATE, FERRITIN, TIBC, IRON, RETICCTPCT in the last 72 hours. Urine analysis:    Component Value Date/Time   COLORURINE YELLOW 12/17/2014 1433   APPEARANCEUR HAZY (A) 12/17/2014 1433   APPEARANCEUR Clear 03/24/2014 1247   LABSPEC 1.010 12/17/2014 1433   LABSPEC 1.016 03/24/2014 1247   PHURINE 6.5 12/17/2014 1433   GLUCOSEU NEGATIVE 12/17/2014 1433   GLUCOSEU Negative 03/24/2014 1247   HGBUR NEGATIVE 12/17/2014 1433   HGBUR negative 03/04/2007 0939   BILIRUBINUR NEGATIVE 12/17/2014 1433   BILIRUBINUR Negative 03/24/2014 1247   KETONESUR NEGATIVE 12/17/2014 1433   PROTEINUR NEGATIVE 12/17/2014 1433   UROBILINOGEN 0.2 12/17/2014 1433   NITRITE NEGATIVE 12/17/2014 1433   LEUKOCYTESUR TRACE (A) 12/17/2014 1433   LEUKOCYTESUR Negative 03/24/2014 1247    Radiological Exams on Admission: DG Chest Port 1 View  Result Date: 05/17/2021 CLINICAL DATA:  56 year old female with shortness of breath for 3 days. Smoker. EXAM: PORTABLE CHEST 1 VIEW COMPARISON:  Chest radiographs 01/30/2019 and earlier. FINDINGS: Portable AP upright view at 0400 hours. Mildly lower lung volumes. Stable cardiac size and mediastinal contours. No convincing cardiomegaly. Visualized tracheal air column is within normal limits. No pneumothorax. Acute on chronic symmetric bilateral increased pulmonary interstitial opacity, with more confluent streaky and veiling opacity at the lung bases. No air bronchograms. No convincing pleural effusion at this time. Paucity of bowel gas. No acute osseous abnormality identified. IMPRESSION: Acute on chronic bilateral pulmonary interstitial opacity, more confluent at the lung bases. This is nonspecific. Top differential considerations of viral/atypical respiratory infection, pulmonary interstitial  edema, atelectasis, other interstitial lung disease. Electronically Signed   By: Odessa Fleming M.D.   On: 05/17/2021 04:26    EKG: Independently reviewed. NSR  Assessment/Plan Principal Problem:   Acute heart failure (HCC) Active Problems:   ANEMIA-IRON DEFICIENCY   TOBACCO ABUSE   Cannabis abuse   Essential hypertension   Hyperglycemia   COPD (chronic obstructive pulmonary disease) (HCC)   Hypertensive urgency   AKI (acute kidney injury) (HCC)   Pulmonary edema   PTSD (post-traumatic  stress disorder)   Assessment and Plan: * Acute heart failure (HCC) -- Patient reports no prior history of heart failure.  She says she was started on Entresto by her PCP for blood pressure reasons only. -- Patient has not seen her a PCP in over 1 year. -- Check a 2D echocardiogram for further evaluation -- Continue IV Lasix as ordered for diuresis -- Temporarily holding Entresto for now given AKI -- Resume home beta-blocker therapy and slowly improve blood pressure over the next 12 to 24 hours -- PE work-up in progress -- Strongly advised to stop all tobacco use -- TED hose to bilateral lower extremities  PTSD (post-traumatic stress disorder) -- Resume home behavioral health medications  Pulmonary edema -- IV Lasix ordered for diuresis -- Repeat chest x-ray 05/18/2021  AKI (acute kidney injury) (HCC) -- This is presumed AKI and hopefully renal function will improve with diuresis -- Renally dose medications as appropriate -- Working on blood pressure improvements -- Geophysical data processor -- Check renal ultrasound -- Monitor intake and output closely  Hypertensive urgency -- Restarted home blood pressure medications as noted -- No need to rapidly lower blood pressure at this time  COPD (chronic obstructive pulmonary disease) (HCC) -- Bronchodilators ordered -- Smoking cessation counseling  Hyperglycemia -- Patient reports history of prediabetes mellitus, check hemoglobin A1c,  carbohydrate modified diet ordered -- No concentrated sweets or fruit juices except to treat a low blood glucose  Essential hypertension -- See orders to resume home blood pressure medications  Cannabis abuse -- Check urine toxicology screen  TOBACCO ABUSE -- Patient was strongly advised to stop all tobacco use and a nicotine patch was ordered for use in the hospital  [facility] DEFICIENCY -- Check anemia panel   DVT prophylaxis: enoxaparin   Code Status: full   Family Communication:   Disposition Plan: anticipate home   Consults called:   Admission status: INP  Level of care: Telemetry Standley Dakins MD [facility] How to contact the [facility] Attending or Consulting provider 7A - 7P or covering provider during after hours 7P -7A, for this patient?  Check the care team in [facility] and look for a) attending/consulting [facility] provider listed and b) the [facility] team listed Log into [email or web address] and use [place]'s universal password to access. If you do not have the password, please contact the hospital operator. Locate the [facility] provider you are looking for under [facility] and page to a number that you can be directly reached. If you still have difficulty reaching the provider, please page the [facility] (Director on Call) for the Hospitalists listed on amion for assistance.   If 7PM-7AM, please contact night-coverage [email or web address] Password [facility]  05/17/2021, 9:32 AM

## 2021-05-17 NOTE — Assessment & Plan Note (Addendum)
--   Patient reports no prior history of heart failure.  She says she was started on Entresto by her PCP for blood pressure reasons only. ?-- Patient has not seen her a PCP in over 1 year. ?-- 2D echocardiogram LVEF 65-70 with grade 2 DD, severe LVH ?-- Continue IV Lasix as ordered for diuresis ?-- discontinued Entresto for now given AKI ?-- Resumed home beta-blocker therapy and slowly improved blood pressure ?-- PE/DVT work-up was negative ?-- Strongly advised to stop all tobacco use ?-- outpatient referral to PCP, cardiology, sleep study, hypertension clinic ?

## 2021-05-17 NOTE — TOC Initial Note (Signed)
Transition of Care (TOC) - Initial/Assessment Note  ? ? ?Patient Details  ?Name: [name] ?MRN: [id number] ?Date of Birth: 08-24-1965 ? ?Transition of Care (TOC) CM/SW Contact:    ?Sameka Bagent D, LCSW ?Phone Number: ?05/17/2021, 3:40 PM ? ?Clinical Narrative:                 ?Patient from home with two sons. Admitted for acute heart failure. Consulted for HF home screen. Patient has not been educated on a heart health diet nor living a heart healthy life. Order placed for HF book.  ?Patient does not drive, her sons provide transportation for her. She is independent at baseline. Patient reports that she should be using a cane and walker but does not.  ? ?Expected Discharge Plan: Home/Self Care ?Barriers to Discharge: Continued Medical Work up ? ? ?Patient Goals and CMS Choice ?Patient states their goals for this hospitalization and ongoing recovery are:: return home ?  ?  ? ?Expected Discharge Plan and Services ?Expected Discharge Plan: Home/Self Care ?  ?  ?  ?Living arrangements for the past 2 months: [place] ?                ?  ?  ?  ?  ?  ?  ?  ?  ?  ?  ? ?Prior Living Arrangements/Services ?Living arrangements for the past 2 months: [place] ?Lives with:: Adult Children ?  ?       ?  ?  ?  ?  ? ?Activities of Daily Living ?Home Assistive Devices/Equipment: None ?ADL Screening (condition at time of admission) ?Patient's cognitive ability adequate to safely complete daily activities?: Yes ?Is the patient deaf or have difficulty hearing?: No ?Does the patient have difficulty seeing, even when wearing glasses/contacts?: No ?Does the patient have difficulty concentrating, remembering, or making decisions?: No ?Patient able to express need for assistance with ADLs?: Yes ?Does the patient have difficulty dressing or bathing?: No ?Independently performs ADLs?: Yes (appropriate for developmental age) ?Does the patient have difficulty walking or climbing stairs?: No ?Weakness of Legs:  Both ?Weakness of Arms/Hands: None ? ?Permission Sought/Granted ?  ?  ?   ?   ?   ?   ? ?Emotional Assessment ?  ?  ?  ?  ?  ?  ? ?Admission diagnosis:  Orthopnea [R06.01] ?Acute heart failure ([place]) [I50.9] ?SOB (shortness of breath) [R06.02] ?Patient Active Problem List  ? Diagnosis Date Noted  ? Acute heart failure ([place]) 05/17/2021  ? COPD (chronic obstructive pulmonary disease) ([place]) 05/17/2021  ? Hypertensive urgency 05/17/2021  ? AKI (acute kidney injury) ([place]) 05/17/2021  ? Pulmonary edema 05/17/2021  ? PTSD (post-traumatic stress disorder) 05/17/2021  ? Constipation 01/24/2020  ? Early satiety 01/24/2020  ? Dysphagia 01/24/2020  ? Colon cancer screening 06/29/2018  ? COPD with acute exacerbation ([place]) 09/28/2014  ? Chest pain 09/28/2014  ? Hyperglycemia 09/28/2014  ? Tachycardia 09/28/2014  ? COPD exacerbation ([place]) 09/28/2014  ? Cannabis abuse 06/29/2007  ? PANIC ATTACK, ACUTE 06/05/2007  ? TOBACCO ABUSE 03/04/2007  ? ANEMIA-IRON DEFICIENCY 02/17/2007  ? DEPRESSION 02/17/2007  ? Essential hypertension 02/17/2007  ? ARM PAIN, LEFT 02/17/2007  ? ?PCP:  Pcp, No ?Pharmacy:   ?[id number] - [place], [place] ?[address] ?[place] [address] ?Phone: [phone] Fax: [phone] ? ?[place], [place] ?[id number] ?[address] ?[place]  [id number] ?Phone: [phone] Fax: [phone] ? ? ? ? ?Social Determinants of Health (SDOH) Interventions ?  ? ?Readmission Risk Interventions ?   ? View : No data to display.  ?  ?  ?  ? ? ? ?

## 2021-05-17 NOTE — Assessment & Plan Note (Addendum)
--   anemia panel was reassuring ?

## 2021-05-17 NOTE — Progress Notes (Signed)
Patient complaining of sob when laying flat. Patient hob elevated but still no relief. Patient complaining of not being able to sleep due to the sob. O2 checked 99% on RA. Patient given Trazodone 200mg  at 2210. Not effective per patient. MD Zierle-Ghosh notified. New order received for one dose 0.5mg  IV Ativan. Will continue to monitor.  ?

## 2021-05-17 NOTE — Assessment & Plan Note (Addendum)
--   urine toxicology screen positive for THC only ?
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
## 2021-05-17 NOTE — Assessment & Plan Note (Signed)
--   Patient was strongly advised to stop all tobacco use and a nicotine patch was ordered for use in the hospital ?

## 2021-05-17 NOTE — Plan of Care (Signed)
  Problem: Education: Goal: Knowledge of General Education information will improve Description: Including pain rating scale, medication(s)/side effects and non-pharmacologic comfort measures Outcome: Progressing   Problem: Health Behavior/Discharge Planning: Goal: Ability to manage health-related needs will improve Outcome: Progressing   Problem: Education: Goal: Ability to demonstrate management of disease process will improve Outcome: Progressing   

## 2021-05-17 NOTE — ED Triage Notes (Signed)
Pt c/o sob for 3 days. Pt states her inhaler is not working.  ?

## 2021-05-17 NOTE — Assessment & Plan Note (Addendum)
--   Bronchodilators and home nebulizer ordered ?-- Smoking cessation counseling ?

## 2021-05-18 DIAGNOSIS — I5031 Acute diastolic (congestive) heart failure: Secondary | ICD-10-CM

## 2021-05-18 LAB — BASIC METABOLIC PANEL
Anion gap: 7 (ref 5–15)
BUN: 26 mg/dL — ABNORMAL HIGH (ref 6–20)
CO2: 24 mmol/L (ref 22–32)
Calcium: 8.8 mg/dL — ABNORMAL LOW (ref 8.9–10.3)
Chloride: 109 mmol/L (ref 98–111)
Creatinine, Ser: 1.46 mg/dL — ABNORMAL HIGH (ref 0.44–1.00)
GFR, Estimated: 42 mL/min — ABNORMAL LOW (ref >60)
Glucose, Bld: 103 mg/dL — ABNORMAL HIGH (ref 70–99)
Potassium: 3.5 mmol/L (ref 3.5–5.1)
Sodium: 140 mmol/L (ref 135–145)

## 2021-05-18 LAB — BRAIN NATRIURETIC PEPTIDE: B Natriuretic Peptide: 276 pg/mL — ABNORMAL HIGH (ref 0.0–100.0)

## 2021-05-18 LAB — GLUCOSE, CAPILLARY
Glucose-Capillary: 95 mg/dL (ref 70–99)
Glucose-Capillary: 199 mg/dL — ABNORMAL HIGH (ref 70–99)
Glucose-Capillary: 95 mg/dL (ref 70–99)
Glucose-Capillary: 108 mg/dL — ABNORMAL HIGH (ref 70–99)

## 2021-05-18 LAB — MAGNESIUM: Magnesium: 2.2 mg/dL (ref 1.7–2.4)

## 2021-05-18 MED ORDER — AMLODIPINE BESYLATE 5 MG PO TABS
10.0000 mg | ORAL_TABLET | Freq: Every day | ORAL | Status: DC
  Administered 2021-05-18 – 2021-05-19 (×2): 10 mg via ORAL
  Filled 2021-05-18 (×2): qty 2

## 2021-05-18 MED ORDER — LABETALOL HCL 200 MG PO TABS
400.0000 mg | ORAL_TABLET | Freq: Three times a day (TID) | ORAL | Status: DC
  Administered 2021-05-18 – 2021-05-19 (×4): 400 mg via ORAL
  Filled 2021-05-18 (×4): qty 2

## 2021-05-18 MED ORDER — CLONIDINE HCL 0.1 MG PO TABS
0.2000 mg | ORAL_TABLET | Freq: Two times a day (BID) | ORAL | Status: DC
  Administered 2021-05-18 – 2021-05-19 (×3): 0.2 mg via ORAL
  Filled 2021-05-18 (×3): qty 2

## 2021-05-18 MED ORDER — LORAZEPAM 0.5 MG PO TABS
0.5000 mg | ORAL_TABLET | ORAL | Status: DC | PRN
  Administered 2021-05-18: 0.5 mg via ORAL
  Filled 2021-05-18: qty 1

## 2021-05-18 NOTE — Progress Notes (Signed)
Patient blood pressure 194/112. Rechecked 176/97. MD Zierle-Ghosh made aware. Scheduled labetalol 200mg  given to patient at 2100. Will continue to monitor.  ? ? ? ? ?

## 2021-05-18 NOTE — TOC Progression Note (Signed)
Transition of Care (TOC) - Progression Note  ? ? ?Patient Details  ?Name: [name] ?MRN: [id number] ?Date of Birth: 11-Feb-1966 ? ?Transition of Care (TOC) CM/SW Contact  ?Koree Staheli D, LCSW ?Phone Number: ?05/18/2021, 11:53 AM ? ?Clinical Narrative:    ?Patient provided a list of providers accepting new patients in [place].  ? ? ?Expected Discharge Plan: Home/Self Care ?Barriers to Discharge: Continued Medical Work up ? ?Expected Discharge Plan and Services ?Expected Discharge Plan: Home/Self Care ?  ?  ?  ?Living arrangements for the past 2 months: Single Family Home ?                ?  ?  ?  ?  ?  ?  ?  ?  ?  ?  ? ? ?Social Determinants of Health (SDOH) Interventions ?  ? ?Readmission Risk Interventions ?   ? View : No data to display.  ?  ?  ?  ? ? ?

## 2021-05-18 NOTE — Progress Notes (Signed)
?PROGRESS NOTE ? ? ?[name]  [id number] DOB: September 03, 1965 DOA: 05/17/2021 ?PCP: Pcp, No  ? ?Chief Complaint  ?Patient presents with  ? Shortness of Breath  ? ?Level of care: Telemetry ? ?Brief Admission History:  ?56 year old female with hypertension, chronic back pain, bipolar disorder, prediabetes, PTSD and anxiety disorder who is a longtime smoker presented to the emergency department with complaints of persistent dyspnea.  She reports she has been having episodes of intermittent dyspnea and chest pain for the past 2 months.  She reports that she has not seen a PCP in over 1 year but has been taking some medications due to refills.  She had been seen by Dr. Cindie Laroche prior to his retirement.  She is a longtime smoker and continues to smoke 1/2 packs/day.  She reports that over the past 2 months she has noticed increased edema in her legs.  She reports that she took her husband's Lasix tablets starting 2 days ago and has been urinating more frequently.  She also reports that she is having difficulty lying recumbent due to severe shortness of breath.  She is having difficulty sleeping at night due to shortness of breath symptoms.  She reports that she has been concerned about the edema in her legs. ? ?Her ED work-up was significant for hypertensive urgency with markedly elevated blood pressures.  She was also noted to have an elevated creatinine of 1.69 BUN 23.  Her glucose was 159.  Her cardiac BNP was elevated at 356 and her chest x-ray showed findings of pulmonary edema.  Her at [facility] troponin testing was negative.  Her influenza and SARS 2 coronavirus testing was negative.  The EKG did not show any acute findings.  She was given a dose of oral prednisone and IV Lasix and admission was requested. ? ?05/18/2021: Pt says she is urinating on IV lasix.  Still having SOB, unable to lie recumbent and unable to sleep.  No CP.   ?  ?Assessment and Plan: ?* Acute heart failure preserved EF ?-- Patient reports no prior  history of heart failure.  She says she was started on Entresto by her PCP for blood pressure reasons only. ?-- Patient has not seen her a PCP in over 1 year. ?-- 2D echocardiogram LVEF 65-70 with grade 2 DD, severe LVH ?-- Continue IV Lasix as ordered for diuresis ?-- Temporarily holding Entresto for now given AKI ?-- Resume home beta-blocker therapy and slowly improve blood pressure over the next 12 hours ?-- PE/DVT work-up was negative ?-- Strongly advised to stop all tobacco use ?-- TED hose to bilateral lower extremities ? ?PTSD (post-traumatic stress disorder) ?-- Resume home behavioral health medications ? ?Pulmonary edema ?-- IV Lasix ordered for diuresis ?-- Repeat chest x-ray 05/19/2021 ? ?AKI (acute kidney injury) ([place]) ?-- This is presumed AKI and hopefully renal function will improve with diuresis ?-- Renally dose medications as appropriate ?-- Working on blood pressure improvements ?-- holding Entresto due to AKI ?--renal ultrasound reassuring ?-- Monitor intake and output closely ? ?Hypertensive urgency ?-- Restarted home blood pressure medications as noted ?-- BPs have slowly started to come down.  ?-- pt agreeable to referral to advanced hypertension clinic ? ?COPD (chronic obstructive pulmonary disease) ([place]) ?-- Bronchodilators ordered ?-- Smoking cessation counseling ? ?Hyperglycemia and Prediabetes  ?-- Patient reports history of prediabetes mellitus,hemoglobin A1c is 5.9%, carbohydrate modified diet ordered ?-- No concentrated sweets or fruit juices except to treat a low blood glucose ? ?Essential hypertension ?-- See orders  to resume home blood pressure medications ? ?Cannabis abuse ?-- urine toxicology screen positive for THC only ? ?TOBACCO ABUSE ?-- Patient was strongly advised to stop all tobacco use and a nicotine patch was ordered for use in the hospital ? ?ANEMIA-IRON DEFICIENCY ?-- Check anemia panel ? ? ?DVT prophylaxis: enoxaparin  ?Code Status: full  ?Family Communication: plan of  care discussed with patient at bedside ?Disposition: Status is: Inpatient ?Remains inpatient appropriate because: IV diuresis with furosemide and close monitoring of electrolytes and renal function  ?  ?Consultants:  ? ?Procedures:  ?Renal US 3/30: reassuring ?TTE 3/30: LVEF 65-70, grade 2 DD, severe LVH ?Antimicrobials:  ?N/a   ?Subjective: ?Pt reports that she is still SOB and can't lie flat and can't sleep.  No CP.  No palpitations.  ?Objective: ?Vitals:  ? 05/18/21 [id number] 05/18/21 0711 05/18/21 1300 05/18/21 1309  ?BP:   (!) 160/93   ?Pulse:   77   ?Resp:   20   ?Temp:   97.8 ?F (36.6 ?C)   ?TempSrc:      ?SpO2:  98% 100% 99%  ?Weight: 104.1 kg     ?Height:      ? ? ?Intake/Output Summary (Last 24 hours) at 05/18/2021 1720 ?Last data filed at 05/18/2021 1300 ?Gross per 24 hour  ?Intake 480 ml  ?Output 100 ml  ?Net 380 ml  ? ?Filed Weights  ? 05/17/21 0340 05/18/21 [id number]  ?Weight: 104.3 kg 104.1 kg  ? ?Examination: ? ?General exam: Appears calm and comfortable  ?Respiratory system: Clear to auscultation. Respiratory effort normal. ?Cardiovascular system: normal S1 & S2 heard. No JVD, murmurs, rubs, gallops or clicks. No pedal edema. ?Gastrointestinal system: Abdomen is nondistended, soft and nontender. No organomegaly or masses felt. Normal bowel sounds heard. ?Central nervous system: Alert and oriented. No focal neurological deficits. ?Extremities: Symmetric 5 x 5 power. ?Skin: No rashes, lesions or ulcers. ?Psychiatry: Judgement and insight appear normal. Mood & affect appropriate.  ? ?Data Reviewed: I have personally reviewed following labs and imaging studies ? ?CBC: ?Recent Labs  ?Lab 05/17/21 ?0410  ?WBC 4.0  ?HGB 10.8*  ?HCT 36.2  ?MCV 81.2  ?PLT 320  ? ? ?Basic Metabolic Panel: ?Recent Labs  ?Lab 05/17/21 ?0410 05/18/21 ?0359  ?NA 139 140  ?K 3.7 3.5  ?CL 112* 109  ?CO2 21* 24  ?GLUCOSE 159* 103*  ?BUN 23* 26*  ?CREATININE 1.69* 1.46*  ?CALCIUM 8.1* 8.8*  ?MG  --  2.2  ? ? ?CBG: ?Recent Labs  ?Lab  05/17/21 ?1627 05/17/21 ?2036 05/18/21 [id number] 05/18/21 ?1118 05/18/21 ?1604  ?GLUCAP 185* 105* 95 199* 95  ? ? ?Recent Results (from the past 240 hour(s))  ?Resp Panel by RT-PCR (Flu A&B, Covid) Nasopharyngeal Swab     Status: None  ? Collection Time: 05/17/21  5:06 AM  ? Specimen: Nasopharyngeal Swab; Nasopharyngeal(NP) swabs in vial transport medium  ?Result Value Ref Range Status  ? SARS Coronavirus 2 by RT PCR NEGATIVE NEGATIVE Final  ?  Comment: (NOTE) ?SARS-CoV-2 target nucleic acids are NOT DETECTED. ? ?The SARS-CoV-2 RNA is generally detectable in upper respiratory ?specimens during the acute phase of infection. The lowest ?concentration of SARS-CoV-2 viral copies this assay can detect is ?138 copies/mL. A negative result does not preclude SARS-Cov-2 ?infection and should not be used as the sole basis for treatment or ?other patient management decisions. A negative result may occur with  ?improper specimen collection/handling, submission of specimen other ?than nasopharyngeal swab, presence of viral mutation(s)  within the ?areas targeted by this assay, and inadequate number of viral ?copies(<138 copies/mL). A negative result must be combined with ?clinical observations, patient history, and epidemiological ?information. The expected result is Negative. ? ?Fact Sheet for Patients:  ?EntrepreneurPulse.com.au ? ?Fact Sheet for Healthcare Providers:  ?IncredibleEmployment.be ? ?This test is no t yet approved or cleared by the [place] FDA and  ?has been authorized for detection and/or diagnosis of SARS-CoV-2 by ?FDA under an Emergency Use Authorization (EUA). This EUA will remain  ?in effect (meaning this test can be used) for the duration of the ?COVID-19 declaration under Section 564(b)(1) of the Act, 21 ?U.S.C.section 360bbb-3(b)(1), unless the authorization is terminated  ?or revoked sooner.  ? ? ?  ? Influenza A by PCR NEGATIVE NEGATIVE Final  ? Influenza B by PCR  NEGATIVE NEGATIVE Final  ?  Comment: (NOTE) ?The Xpert Xpress SARS-CoV-2/FLU/RSV plus assay is intended as an aid ?in the diagnosis of influenza from Nasopharyngeal swab specimens and ?should not be used as a sole basis f

## 2021-05-18 NOTE — Care Management Important Message (Signed)
Important Message ? ?Patient Details  ?Name: [name] ?MRN: [id number] ?Date of Birth: 04-01-1965 ? ? ?Medicare Important Message Given:  Yes ? ? ? ? ?Tommy Medal ?05/18/2021, 3:23 PM ?

## 2021-05-19 DIAGNOSIS — F17200 Nicotine dependence, unspecified, uncomplicated: Secondary | ICD-10-CM

## 2021-05-19 LAB — MAGNESIUM: Magnesium: 2.2 mg/dL (ref 1.7–2.4)

## 2021-05-19 LAB — BASIC METABOLIC PANEL
Anion gap: 9 (ref 5–15)
BUN: 29 mg/dL — ABNORMAL HIGH (ref 6–20)
CO2: 24 mmol/L (ref 22–32)
Calcium: 8.8 mg/dL — ABNORMAL LOW (ref 8.9–10.3)
Chloride: 105 mmol/L (ref 98–111)
Creatinine, Ser: 1.59 mg/dL — ABNORMAL HIGH (ref 0.44–1.00)
GFR, Estimated: 38 mL/min — ABNORMAL LOW (ref >60)
Glucose, Bld: 102 mg/dL — ABNORMAL HIGH (ref 70–99)
Potassium: 3.5 mmol/L (ref 3.5–5.1)
Sodium: 138 mmol/L (ref 135–145)

## 2021-05-19 LAB — GLUCOSE, CAPILLARY
Glucose-Capillary: 129 mg/dL — ABNORMAL HIGH (ref 70–99)
Glucose-Capillary: 118 mg/dL — ABNORMAL HIGH (ref 70–99)

## 2021-05-19 MED ORDER — IPRATROPIUM-ALBUTEROL 0.5-2.5 (3) MG/3ML IN SOLN: 3.0000 mL | Freq: Three times a day (TID) | RESPIRATORY_TRACT | 2 refills | Status: DC

## 2021-05-19 MED ORDER — NICOTINE 21 MG/24HR TD PT24: 21.0000 mg | MEDICATED_PATCH | Freq: Every day | TRANSDERMAL | 0 refills | Status: DC

## 2021-05-19 MED ORDER — CLONIDINE HCL 0.2 MG PO TABS: 0.2000 mg | ORAL_TABLET | Freq: Two times a day (BID) | ORAL | 1 refills | Status: DC

## 2021-05-19 MED ORDER — DEXTROMETHORPHAN POLISTIREX ER 30 MG/5ML PO SUER
30.0000 mg | Freq: Two times a day (BID) | ORAL | 0 refills | Status: AC | PRN
Start: 2021-05-19 — End: 2021-05-24

## 2021-05-19 MED ORDER — ALBUTEROL SULFATE HFA 108 (90 BASE) MCG/ACT IN AERS
2.0000 | INHALATION_SPRAY | Freq: Four times a day (QID) | RESPIRATORY_TRACT | 3 refills | Status: DC | PRN
Start: 2021-05-19 — End: 2023-06-24

## 2021-05-19 MED ORDER — FUROSEMIDE 40 MG PO TABS: 40.0000 mg | ORAL_TABLET | Freq: Every day | ORAL | 1 refills | Status: DC

## 2021-05-19 MED ORDER — DOXYCYCLINE HYCLATE 100 MG PO CAPS: 100.0000 mg | ORAL_CAPSULE | Freq: Two times a day (BID) | ORAL | 0 refills | Status: AC

## 2021-05-19 MED ORDER — PREDNISONE 20 MG PO TABS: ORAL_TABLET | ORAL | 0 refills | Status: DC

## 2021-05-19 MED ORDER — POTASSIUM CHLORIDE CRYS ER 10 MEQ PO TBCR: 10.0000 meq | EXTENDED_RELEASE_TABLET | ORAL | 1 refills | Status: DC

## 2021-05-19 MED ORDER — AMLODIPINE BESYLATE 10 MG PO TABS: 10.0000 mg | ORAL_TABLET | Freq: Every day | ORAL | 1 refills | Status: DC

## 2021-05-19 NOTE — Discharge Instructions (Signed)

## 2021-05-19 NOTE — Progress Notes (Signed)
Pt is up OOB, has showered this am, ambulating in hallway without assistance or SOB. Pt has received tylenol for c/o right neck and head pain with some relief. Denies any new c/o at this time. ?

## 2021-05-19 NOTE — Progress Notes (Signed)
CSW spoke with [place] to order home nebulizer. Jasmine states the [employer] representative will deliver equipment prior to patient discharging from the hospital. ? ?Madilyn Fireman, MSW, LCSW ?Transitions of Care  Clinical Social Worker II ?[phone] ? ?

## 2021-05-19 NOTE — Discharge Summary (Addendum)
Physician Discharge Summary  ?Aigner Stasiak [id number] DOB: Aug 17, 1965 DOA: 05/17/2021 ? ? ?Admit date: 05/17/2021 ?Discharge date: 05/19/2021 ? ?Admitted From:  Home  ?Disposition: Home  ? ?Recommendations for Outpatient Follow-up:  ?Follow up with PCP in 2 weeks ?Establish care with cardiology in 1 month ?Amb referral to sleep study, adv htn clinic made ?Please obtain BMP in 2 weeks ? ?Discharge Condition: STABLE   ?CODE STATUS: FULL  ?DIET: 2g sodium diet / carb modified   ? ?Brief Hospitalization Summary: ?Please see all hospital notes, images, labs for full details of the hospitalization. ?56 year old female with hypertension, chronic back pain, bipolar disorder, prediabetes, PTSD and anxiety disorder who is a longtime smoker presented to the emergency department with complaints of persistent dyspnea.  She reports she has been having episodes of intermittent dyspnea and chest pain for the past 2 months.  She reports that she has not seen a PCP in over 1 year but has been taking some medications due to refills.  She had been seen by Dr. Cindie Laroche prior to his retirement.  She is a longtime smoker and continues to smoke 1/2 packs/day.  She reports that over the past 2 months she has noticed increased edema in her legs.  She reports that she took her husband's Lasix tablets starting 2 days ago and has been urinating more frequently.  She also reports that she is having difficulty lying recumbent due to severe shortness of breath.  She is having difficulty sleeping at night due to shortness of breath symptoms.  She reports that she has been concerned about the edema in her legs. ? ?Her ED work-up was significant for hypertensive urgency with markedly elevated blood pressures.  She was also noted to have an elevated creatinine of 1.69 BUN 23.  Her glucose was 159.  Her cardiac BNP was elevated at 356 and her chest x-ray showed findings of pulmonary edema.  Her at HS troponin testing was negative.  Her influenza and SARS  2 coronavirus testing was negative.  The EKG did not show any acute findings.  She was given a dose of oral prednisone and IV Lasix and admission was requested. ? ?05/18/2021: Pt says she is urinating on IV lasix.  Still having SOB, unable to lie recumbent and unable to sleep.  No CP.   ? ?05/19/2021: Pt feeling better today. Ambulating in room.  No SOB symptoms with ambulation.  DC home today.  Arranging for home nebulizer.  Ambulatory referral for sleep study, cardiology, hypertension.  ? ?HOSPITAL COURSE BY PROBLEM LIST ? ?Assessment and Plan: ?* Acute heart failure preserved EF ?-- Patient reports no prior history of heart failure.  She says she was started on Entresto by her PCP for blood pressure reasons only. ?-- Patient has not seen her a PCP in over 1 year. ?-- 2D echocardiogram LVEF 65-70 with grade 2 DD, severe LVH ?-- Continue IV Lasix as ordered for diuresis ?-- discontinued Entresto for now given AKI ?-- Resumed home beta-blocker therapy and slowly improved blood pressure ?-- PE/DVT work-up was negative ?-- Strongly advised to stop all tobacco use ?-- outpatient referral to PCP, cardiology, sleep study, hypertension clinic ? ?PTSD (post-traumatic stress disorder) ?-- Resume home behavioral health medications ? ?Pulmonary edema ?-- IV Lasix ordered for diuresis ?--Pt feeling better. She is euvolemic. DC home on lasix 40 mg daily.  ?--outpatient referral to cardiology clinic.  ?--encouraged to stop smoking and take meds. ? ?AKI (acute kidney injury) ([place]) ?-- Pt likely has stage  3a CKD from poorly controlled hypertension  ?-- AKI ruled out  ?-- Working on blood pressure improvements ? ?Hypertensive urgency ?-- Restarted home blood pressure medications as noted ?-- BPs have slowly started to come down.  ?-- pt agreeable to referral to advanced hypertension clinic ? ?COPD (chronic obstructive pulmonary disease) ([place]) ?-- Bronchodilators and home nebulizer ordered ?-- Smoking cessation  counseling ? ?Hyperglycemia and Prediabetes  ?-- Patient reports history of prediabetes mellitus,hemoglobin A1c is 5.9%, carbohydrate modified diet ordered ?-- No concentrated sweets or fruit juices except to treat a low blood glucose ? ?Essential hypertension ?-- See orders to resume home blood pressure medications ? ?Cannabis abuse ?-- urine toxicology screen positive for THC only ? ?TOBACCO ABUSE ?-- Patient was strongly advised to stop all tobacco use and a nicotine patch was ordered for use in the hospital ? ?ANEMIA-IRON DEFICIENCY ?-- anemia panel was reassuring ? ? ?Discharge Diagnoses:  ?Principal Problem: ?  Acute heart failure preserved EF ?Active Problems: ?  ANEMIA-IRON DEFICIENCY ?  TOBACCO ABUSE ?  Cannabis abuse ?  Essential hypertension ?  Hyperglycemia and Prediabetes  ?  COPD (chronic obstructive pulmonary disease) ([place]) ?  Hypertensive urgency ?  AKI (acute kidney injury) ([place]) ?  Pulmonary edema ?  PTSD (post-traumatic stress disorder) ? ? ?Discharge Instructions: ?Discharge Instructions   ? ? AMB REFERRAL TO ADVANCED HTN CLINIC   Complete by: As directed ?  ? Ambulatory referral to Cardiology   Complete by: As directed ?  ? Ambulatory referral to Sleep Studies   Complete by: As directed ?  ? ?  ? ?Allergies as of 05/19/2021   ? ?   Reactions  ? Adhesive [tape] Rash  ? ?  ? ?  ?Medication List  ?  ? ?STOP taking these medications   ? ?Entresto 97-103 MG ?Generic drug: sacubitril-valsartan ?  ?naproxen 500 MG tablet ?Commonly known as: NAPROSYN ?  ?pantoprazole 40 MG tablet ?Commonly known as: PROTONIX ?  ?Vraylar 3 MG capsule ?Generic drug: cariprazine ?  ? ?  ? ?TAKE these medications   ? ?albuterol 108 (90 Base) MCG/ACT inhaler ?Commonly known as: VENTOLIN HFA ?Inhale 2 puffs into the lungs every 6 (six) hours as needed for wheezing or shortness of breath. ?  ?amLODipine 10 MG tablet ?Commonly known as: NORVASC ?Take 1 tablet (10 mg total) by mouth daily. ?What changed:  ?medication strength ?how  much to take ?  ?aspirin EC 81 MG tablet ?Take 81 mg by mouth daily. ?  ?cholecalciferol 25 MCG (1000 UNIT) tablet ?Commonly known as: VITAMIN D3 ?Take 2,000 Units by mouth daily. ?  ?cloNIDine 0.2 MG tablet ?Commonly known as: CATAPRES ?Take 1 tablet (0.2 mg total) by mouth 2 (two) times daily. ?  ?dextromethorphan 30 MG/5ML liquid ?Commonly known as: Delsym ?Take 5 mLs (30 mg total) by mouth 2 (two) times daily as needed for up to 5 days for cough. ?  ?Diclofenac Sodium 3 % Gel ?Apply 1 application topically daily as needed for pain. ?  ?doxycycline 100 MG capsule ?Commonly known as: VIBRAMYCIN ?Take 1 capsule (100 mg total) by mouth 2 (two) times daily for 3 days. ?  ?furosemide 40 MG tablet ?Commonly known as: Lasix ?Take 1 tablet (40 mg total) by mouth daily. ?  ?HYDROcodone-acetaminophen 5-325 MG tablet ?Commonly known as: NORCO/VICODIN ?Take 1 tablet by mouth every 6 (six) hours as needed for moderate pain. ?  ?ipratropium-albuterol 0.5-2.5 (3) MG/3ML Soln ?Commonly known as: DUONEB ?Take 3 mLs by  nebulization 3 (three) times daily. ?  ?labetalol 200 MG tablet ?Commonly known as: NORMODYNE ?Take 400 mg by mouth 3 (three) times daily. ?  ?nicotine 21 mg/24hr patch ?Commonly known as: NICODERM CQ - dosed in mg/24 hours ?Place 1 patch (21 mg total) onto the skin daily. ?  ?potassium chloride 10 MEQ tablet ?Commonly known as: KLOR-CON M ?Take 1 tablet (10 mEq total) by mouth every other day. ?  ?predniSONE 20 MG tablet ?Commonly known as: DELTASONE ?Take 3 PO QAM x3days, 2 PO QAM x3days, 1 PO QAM x3days ?  ?QUEtiapine 400 MG tablet ?Commonly known as: SEROQUEL ?Take 400 mg by mouth at bedtime. ?  ?topiramate 200 MG tablet ?Commonly known as: TOPAMAX ?Take 200 mg by mouth daily. ?  ?traZODone 100 MG tablet ?Commonly known as: DESYREL ?Take 200 mg by mouth at bedtime. ?  ? ?  ? ?  ?  ? ? ?  ?Durable Medical Equipment  ?(From admission, onward)  ?  ? ? ?  ? ?  Start     Ordered  ? 05/19/21 1042  For home use only  DME Nebulizer machine  Once       ?Question Answer Comment  ?Patient needs a nebulizer to treat with the following condition COPD (chronic obstructive pulmonary disease) ([place])   ?Length of Need Lifetime   ?  ? 05/19/21 1041

## 2021-05-19 NOTE — Progress Notes (Signed)
Pt discharged via WC to POV with all personal belongings in her possession. Discharge instructions reviewed with pt and questions answered. Pt able to give good return demonstration of use of neb machine. ?

## 2021-07-19 ENCOUNTER — Ambulatory Visit (INDEPENDENT_AMBULATORY_CARE_PROVIDER_SITE_OTHER): Payer: Medicare Other | Admitting: Family

## 2021-07-19 ENCOUNTER — Encounter (HOSPITAL_BASED_OUTPATIENT_CLINIC_OR_DEPARTMENT_OTHER): Payer: Self-pay | Admitting: Family

## 2021-07-19 VITALS — BP 162/100 | HR 62 | Ht 64.5 in | Wt 240.2 lb

## 2021-07-19 DIAGNOSIS — I5032 Chronic diastolic (congestive) heart failure: Secondary | ICD-10-CM

## 2021-07-19 DIAGNOSIS — I10 Essential (primary) hypertension: Secondary | ICD-10-CM | POA: Diagnosis not present

## 2021-07-19 DIAGNOSIS — Z720 Tobacco use: Secondary | ICD-10-CM

## 2021-07-19 DIAGNOSIS — R0609 Other forms of dyspnea: Secondary | ICD-10-CM | POA: Diagnosis not present

## 2021-07-19 DIAGNOSIS — G4730 Sleep apnea, unspecified: Secondary | ICD-10-CM

## 2021-07-19 MED ORDER — FUROSEMIDE 40 MG PO TABS: 40.0000 mg | ORAL_TABLET | Freq: Every day | ORAL | 3 refills | Status: DC

## 2021-07-19 MED ORDER — CARVEDILOL 12.5 MG PO TABS: 12.5000 mg | ORAL_TABLET | Freq: Two times a day (BID) | ORAL | 3 refills | Status: DC

## 2021-07-19 MED ORDER — CLONIDINE HCL 0.2 MG PO TABS
0.2000 mg | ORAL_TABLET | Freq: Two times a day (BID) | ORAL | 3 refills | Status: DC
Start: 2021-07-19 — End: 2022-04-19

## 2021-07-19 NOTE — Patient Instructions (Addendum)
Medication Instructions:   Your physician has recommended you make the following change in your medication:   STOP Metoprolol  START Carvedilol one 12.5mg  tablet twice daily  CHANGE Clonidine to 0.2mg  twice daily  CHANGE Furosemide (Lasix) to 2 tablets (80mg ) daily for 3 days THEN return to 1 tablet (40mg  ) daily   Labwork: Your physician recommends that you return for lab work today: thyroid panel, BNP, BMP    Testing/Procedures:  Your physician has recommended that you have a sleep study. This test records several body functions during sleep, including: brain activity, eye movement, oxygen and carbon dioxide blood levels, heart rate and rhythm, breathing rate and rhythm, the flow of air through your mouth and nose, snoring, body muscle movements, and chest and belly movement.    Follow-Up: July 11th at 8am at the [facility] with PharmD    Referrals:   PREP exercise program at the [facility]   Special Instructions:   Drink less than 2 liters (64 oz) of fluid per day. If your mouth feels dry, try a hard candy or chewing gum as it helps to stimulate salivary glands.  Follow low sodium diet Gradually increase to 150 minutes of exercise per week   DASH Eating Plan DASH stands for "Dietary Approaches to Stop Hypertension." The DASH eating plan is a healthy eating plan that has been shown to reduce high blood pressure (hypertension). It may also reduce your risk for type 2 diabetes, heart disease, and stroke. The DASH eating plan may also help with weight loss. What are tips for following this plan?  General guidelines Avoid eating more than 2,300 mg (milligrams) of salt (sodium) a day. If you have hypertension, you may need to reduce your sodium intake to 1,500 mg a day. Limit alcohol intake to no more than 1 drink a day for nonpregnant women and 2 drinks a day for men. One drink equals 12 oz of beer, 5 oz of wine, or 1 oz of hard liquor. Work with your health care provider to  maintain a healthy body weight or to lose weight. Ask what an ideal weight is for you. Get at least 30 minutes of exercise that causes your heart to beat faster (aerobic exercise) most days of the week. Activities may include walking, swimming, or biking. Work with your health care provider or diet and nutrition specialist (dietitian) to adjust your eating plan to your individual calorie needs. Reading food labels  Check food labels for the amount of sodium per serving. Choose foods with less than 5 percent of the Daily Value of sodium. Generally, foods with less than 300 mg of sodium per serving fit into this eating plan. To find whole grains, look for the word "whole" as the first word in the ingredient list. Shopping Buy products labeled as "low-sodium" or "no salt added." Buy fresh foods. Avoid canned foods and premade or frozen meals. Cooking Avoid adding salt when cooking. Use salt-free seasonings or herbs instead of table salt or sea salt. Check with your health care provider or pharmacist before using salt substitutes. Do not fry foods. Cook foods using healthy methods such as baking, boiling, grilling, and broiling instead. Cook with heart-healthy oils, such as olive, canola, soybean, or sunflower oil. Meal planning Eat a balanced diet that includes: 5 or more servings of fruits and vegetables each day. At each meal, try to fill half of your plate with fruits and vegetables. Up to 6-8 servings of whole grains each day. Less than 6  oz of lean meat, poultry, or fish each day. A 3-oz serving of meat is about the same size as a deck of cards. One egg equals 1 oz. 2 servings of low-fat dairy each day. A serving of nuts, seeds, or beans 5 times each week. Heart-healthy fats. Healthy fats called Omega-3 fatty acids are found in foods such as flaxseeds and coldwater fish, like sardines, salmon, and mackerel. Limit how much you eat of the following: Canned or prepackaged foods. Food that is  high in trans fat, such as fried foods. Food that is high in saturated fat, such as fatty meat. Sweets, desserts, sugary drinks, and other foods with added sugar. Full-fat dairy products. Do not salt foods before eating. Try to eat at least 2 vegetarian meals each week. Eat more home-cooked food and less restaurant, buffet, and fast food. When eating at a restaurant, ask that your food be prepared with less salt or no salt, if possible. What foods are recommended? The items listed may not be a complete list. Talk with your dietitian about what dietary choices are best for you. Grains Whole-grain or whole-wheat bread. Whole-grain or whole-wheat pasta. Brown rice. Orpah Cobb. Bulgur. Whole-grain and low-sodium cereals. Pita bread. Low-fat, low-sodium crackers. Whole-wheat flour tortillas. Vegetables Fresh or frozen vegetables (raw, steamed, roasted, or grilled). Low-sodium or reduced-sodium tomato and vegetable juice. Low-sodium or reduced-sodium tomato sauce and tomato paste. Low-sodium or reduced-sodium canned vegetables. Fruits All fresh, dried, or frozen fruit. Canned fruit in natural juice (without added sugar). Meat and other protein foods Skinless chicken or [place]. Ground chicken or [place]. Pork with fat trimmed off. Fish and seafood. Egg whites. Dried beans, peas, or lentils. Unsalted nuts, nut butters, and seeds. Unsalted canned beans. Lean cuts of beef with fat trimmed off. Low-sodium, lean deli meat. Dairy Low-fat (1%) or fat-free (skim) milk. Fat-free, low-fat, or reduced-fat cheeses. Nonfat, low-sodium ricotta or cottage cheese. Low-fat or nonfat yogurt. Low-fat, low-sodium cheese. Fats and oils Soft margarine without trans fats. Vegetable oil. Low-fat, reduced-fat, or light mayonnaise and salad dressings (reduced-sodium). Canola, safflower, olive, soybean, and sunflower oils. Avocado. Seasoning and other foods Herbs. Spices. Seasoning mixes without salt. Unsalted popcorn and  pretzels. Fat-free sweets. What foods are not recommended? The items listed may not be a complete list. Talk with your dietitian about what dietary choices are best for you. Grains Baked goods made with fat, such as croissants, muffins, or some breads. Dry pasta or rice meal packs. Vegetables Creamed or fried vegetables. Vegetables in a cheese sauce. Regular canned vegetables (not low-sodium or reduced-sodium). Regular canned tomato sauce and paste (not low-sodium or reduced-sodium). Regular tomato and vegetable juice (not low-sodium or reduced-sodium). Rosita Fire. Olives. Fruits Canned fruit in a light or heavy syrup. Fried fruit. Fruit in cream or butter sauce. Meat and other protein foods Fatty cuts of meat. Ribs. Fried meat. Tomasa Blase. Sausage. Bologna and other processed lunch meats. Salami. Fatback. Hotdogs. Bratwurst. Salted nuts and seeds. Canned beans with added salt. Canned or smoked fish. Whole eggs or egg yolks. Chicken or [place] with skin. Dairy Whole or 2% milk, cream, and half-and-half. Whole or full-fat cream cheese. Whole-fat or sweetened yogurt. Full-fat cheese. Nondairy creamers. Whipped toppings. Processed cheese and cheese spreads. Fats and oils Butter. Stick margarine. Lard. Shortening. Ghee. Bacon fat. Tropical oils, such as coconut, palm kernel, or palm oil. Seasoning and other foods Salted popcorn and pretzels. Onion salt, garlic salt, seasoned salt, table salt, and sea salt. Worcestershire sauce. Tartar sauce. Barbecue sauce. Teriyaki  sauce. Soy sauce, including reduced-sodium. Steak sauce. Canned and packaged gravies. Fish sauce. Oyster sauce. Cocktail sauce. Horseradish that you find on the shelf. Ketchup. Mustard. Meat flavorings and tenderizers. Bouillon cubes. Hot sauce and Tabasco sauce. Premade or packaged marinades. Premade or packaged taco seasonings. Relishes. Regular salad dressings. Where to find more information: National Heart, Lung, and Blood Institute:  PopSteam.is American Heart Association: www.heart.org Summary The DASH eating plan is a healthy eating plan that has been shown to reduce high blood pressure (hypertension). It may also reduce your risk for type 2 diabetes, heart disease, and stroke. With the DASH eating plan, you should limit salt (sodium) intake to 2,300 mg a day. If you have hypertension, you may need to reduce your sodium intake to 1,500 mg a day. When on the DASH eating plan, aim to eat more fresh fruits and vegetables, whole grains, lean proteins, low-fat dairy, and heart-healthy fats. Work with your health care provider or diet and nutrition specialist (dietitian) to adjust your eating plan to your individual calorie needs. This information is not intended to replace advice given to you by your health care provider. Make sure you discuss any questions you have with your health care provider. Document Released: 01/24/2011 Document Revised: 01/17/2017 Document Reviewed: 01/29/2016 Elsevier Patient Education  2020 [employer].
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
## 2021-07-20 ENCOUNTER — Telehealth: Payer: Self-pay

## 2021-07-20 ENCOUNTER — Telehealth (HOSPITAL_BASED_OUTPATIENT_CLINIC_OR_DEPARTMENT_OTHER): Payer: Self-pay | Admitting: Family

## 2021-07-20 ENCOUNTER — Telehealth (HOSPITAL_BASED_OUTPATIENT_CLINIC_OR_DEPARTMENT_OTHER): Payer: Self-pay

## 2021-07-20 DIAGNOSIS — I10 Essential (primary) hypertension: Secondary | ICD-10-CM

## 2021-07-20 LAB — THYROID PANEL WITH TSH
Free Thyroxine Index: 1.8 (ref 1.2–4.9)
T3 Uptake Ratio: 27 % (ref 24–39)
T4, Total: 6.7 ug/dL (ref 4.5–12.0)
TSH: 0.762 u[IU]/mL (ref 0.450–4.500)

## 2021-07-20 LAB — BASIC METABOLIC PANEL
BUN/Creatinine Ratio: 10 (ref 9–23)
BUN: 13 mg/dL (ref 6–24)
CO2: 20 mmol/L (ref 20–29)
Calcium: 9.4 mg/dL (ref 8.7–10.2)
Chloride: 109 mmol/L — ABNORMAL HIGH (ref 96–106)
Creatinine, Ser: 1.28 mg/dL — ABNORMAL HIGH (ref 0.57–1.00)
Glucose: 101 mg/dL — ABNORMAL HIGH (ref 70–99)
Potassium: 4.4 mmol/L (ref 3.5–5.2)
Sodium: 143 mmol/L (ref 134–144)
eGFR: 49 mL/min/{1.73_m2} — ABNORMAL LOW (ref >59)

## 2021-07-20 LAB — BRAIN NATRIURETIC PEPTIDE: BNP: 168.8 pg/mL — ABNORMAL HIGH (ref 0.0–100.0)

## 2021-07-20 NOTE — Telephone Encounter (Signed)
Follow Up:    Patient said [name] was supposed to change her Furosemide to 80 mg. She said the pharmacist said they not have a prescription there for it. She would like for somebody to please call in today. She says her feet were really swollen last night.

## 2021-07-20 NOTE — Telephone Encounter (Signed)
Called to discuss PREP program, wants to attend at [facility]; will have Bev, RN [facility] contact her later this summer when next classes are scheduled at [employer]

## 2021-07-20 NOTE — Telephone Encounter (Signed)
RN returned call to patient and left a VM explaining to take 2 40mg  tablets for 3 days then return to normal 40mg  dosing daily

## 2021-07-20 NOTE — Telephone Encounter (Addendum)
Left message for patient to call back    ----- Message from Loel Dubonnet, NP sent at 07/20/2021  4:25 PM EDT ----- BNP with mild volume overload.  Normal thyroid function.  Normal electrolytes.  Kidney function improved back to her baseline.  Start Entresto 49-51 mg twice daily.  Repeat BMP in 2 weeks.

## 2021-07-23 ENCOUNTER — Telehealth (HOSPITAL_BASED_OUTPATIENT_CLINIC_OR_DEPARTMENT_OTHER): Payer: Self-pay

## 2021-07-23 MED ORDER — FUROSEMIDE 40 MG PO TABS: 40.0000 mg | ORAL_TABLET | Freq: Every day | ORAL | 3 refills | Status: DC

## 2021-07-23 MED ORDER — ENTRESTO 49-51 MG PO TABS: 1.0000 | ORAL_TABLET | Freq: Two times a day (BID) | ORAL | 3 refills | Status: DC

## 2021-07-23 NOTE — Telephone Encounter (Signed)
RN returned call to patient and provided the following recommendations. Patient states she will go pick up her Entresto, take increased dose of Lasix, and continue Carvedilol. Patient verbalized understanding of weighing and checking BP each day and calling us on Thursday.         "Starting the [facility] will help to lower her blood pressure. As will taking the increased dose of Lasix. Please ensure she picked up her Carvedilol (at last clinic visit her Metoprolol was switched to Carvedilol)   Please ask her to check weight daily in the morning and BP daily at least 2 hours after medications.    Call us Thursday with update of symptoms, weight, and BP.   Alver Sorrow, NP "

## 2021-07-23 NOTE — Telephone Encounter (Signed)
Entresto 49-51mg  Prior auth started, Key: [id number]

## 2021-07-23 NOTE — Addendum Note (Signed)
Addended by: Gerald Stabs on: 07/23/2021 11:33 AM   Modules accepted: Orders

## 2021-07-23 NOTE — Telephone Encounter (Signed)
Received fax documentation from [facility] that prior auth no required.

## 2021-07-23 NOTE — Telephone Encounter (Signed)
Routed to [facility] triage °

## 2021-07-23 NOTE — Telephone Encounter (Signed)
Pt requesting call back to discuss results

## 2021-07-23 NOTE — Telephone Encounter (Addendum)
Labs ordered and mailed, prescriptions updated and sent to pharmacy on file      "BNP with mild volume overload.  Normal thyroid function.  Normal electrolytes.  Kidney function improved back to her baseline.   Start Entresto 49-51 mg twice daily.  Repeat BMP in 2 weeks."     202/128- today, no chest pain, sweating. She does endorse a minor headache and still short of breath but has not taken the increased lasix dosing yet. Reexplained dosing to patient and told her to start today.  171/116 169/105 175/112  Pulse has been running anywhere from 80,now down to 61    Routing to Gillian Shields, NP for advisement

## 2021-07-23 NOTE — Telephone Encounter (Signed)
Starting the [name] will help to lower her blood pressure. As will taking the increased dose of Lasix. Please ensure she picked up her Carvedilol (at last clinic visit her Metoprolol was switched to Carvedilol)  Please ask her to check weight daily in the morning and BP daily at least 2 hours after medications.   Call us Thursday with update of symptoms, weight, and BP.  Alver Sorrow, NP

## 2021-07-26 ENCOUNTER — Encounter (HOSPITAL_BASED_OUTPATIENT_CLINIC_OR_DEPARTMENT_OTHER): Payer: Self-pay

## 2021-08-01 ENCOUNTER — Telehealth: Payer: Self-pay | Admitting: Family

## 2021-08-01 NOTE — Telephone Encounter (Signed)
Follow  Up:    Patient wants to know when is she supposed to have lab work.She received a letter saying she needs letter work and she said that she just had lab work on 07-19-21.

## 2021-08-01 NOTE — Telephone Encounter (Signed)
Spoke with patient and advised needed follow up labs 2 weeks after starting Entresto  Confirmed she started [employer] 6/5, advised needs labs next week  Patient verbalized understanding

## 2021-08-03 ENCOUNTER — Encounter (HOSPITAL_BASED_OUTPATIENT_CLINIC_OR_DEPARTMENT_OTHER): Payer: Self-pay

## 2021-08-15 LAB — BASIC METABOLIC PANEL
BUN/Creatinine Ratio: 14 (ref 9–23)
BUN: 24 mg/dL (ref 6–24)
CO2: 22 mmol/L (ref 20–29)
Calcium: 9.3 mg/dL (ref 8.7–10.2)
Chloride: 106 mmol/L (ref 96–106)
Creatinine, Ser: 1.66 mg/dL — ABNORMAL HIGH (ref 0.57–1.00)
Glucose: 129 mg/dL — ABNORMAL HIGH (ref 70–99)
Potassium: 4 mmol/L (ref 3.5–5.2)
Sodium: 142 mmol/L (ref 134–144)
eGFR: 36 mL/min/{1.73_m2} — ABNORMAL LOW (ref >59)

## 2021-08-16 ENCOUNTER — Telehealth (HOSPITAL_BASED_OUTPATIENT_CLINIC_OR_DEPARTMENT_OTHER): Payer: Self-pay

## 2021-08-16 NOTE — Telephone Encounter (Addendum)
Left message for patient to call back    ----- Message from Alver Sorrow, NP sent at 08/16/2021 10:36 AM EDT ----- Kidney function slightly decreased from prior. Reduce Entresto to 24-26mg  BID. Follow up as scheduled with pharmacist.

## 2021-08-17 ENCOUNTER — Telehealth (HOSPITAL_BASED_OUTPATIENT_CLINIC_OR_DEPARTMENT_OTHER): Payer: Self-pay

## 2021-08-17 NOTE — Telephone Encounter (Addendum)
Left message for patient to call back    ----- Message from Caitlin S Walker, NP sent at 08/16/2021 10:36 AM EDT ----- Kidney function slightly decreased from prior. Reduce Entresto to 24-26mg BID. Follow up as scheduled with pharmacist. 

## 2021-08-20 ENCOUNTER — Telehealth (HOSPITAL_BASED_OUTPATIENT_CLINIC_OR_DEPARTMENT_OTHER): Payer: Self-pay

## 2021-08-20 MED ORDER — ENTRESTO 24-26 MG PO TABS: 1.0000 | ORAL_TABLET | Freq: Two times a day (BID) | ORAL | 3 refills | Status: DC

## 2021-08-20 NOTE — Telephone Encounter (Addendum)
Results called to patient who verbalizes understanding, new prescription sent to pharmacy.      ----- Message from Alver Sorrow, NP sent at 08/16/2021 10:36 AM EDT ----- Kidney function slightly decreased from prior. Reduce Entresto to 24-26mg  BID. Follow up as scheduled with pharmacist.

## 2021-08-24 ENCOUNTER — Ambulatory Visit: Payer: Medicare Other | Attending: Family | Admitting: Cardiology

## 2021-08-24 DIAGNOSIS — I10 Essential (primary) hypertension: Secondary | ICD-10-CM

## 2021-08-24 DIAGNOSIS — G4730 Sleep apnea, unspecified: Secondary | ICD-10-CM

## 2021-08-25 ENCOUNTER — Telehealth: Payer: Self-pay

## 2021-08-25 NOTE — Telephone Encounter (Signed)
LVMT pt requesting call back to discuss next PREP class starting in late Aug at [facility]

## 2021-08-28 ENCOUNTER — Encounter: Payer: Self-pay | Admitting: Pharmacist

## 2021-08-28 ENCOUNTER — Ambulatory Visit (INDEPENDENT_AMBULATORY_CARE_PROVIDER_SITE_OTHER): Payer: Medicare Other | Admitting: Pharmacist

## 2021-08-28 VITALS — BP 130/89 | HR 70 | Ht 64.5 in | Wt 229.0 lb

## 2021-08-28 DIAGNOSIS — I5032 Chronic diastolic (congestive) heart failure: Secondary | ICD-10-CM

## 2021-08-28 DIAGNOSIS — I5031 Acute diastolic (congestive) heart failure: Secondary | ICD-10-CM | POA: Diagnosis not present

## 2021-08-28 DIAGNOSIS — I10 Essential (primary) hypertension: Secondary | ICD-10-CM

## 2021-08-28 DIAGNOSIS — M7918 Myalgia, other site: Secondary | ICD-10-CM | POA: Insufficient documentation

## 2021-08-28 MED ORDER — CARVEDILOL 12.5 MG PO TABS: 12.5000 mg | ORAL_TABLET | Freq: Two times a day (BID) | ORAL | 3 refills | Status: DC

## 2021-08-28 MED ORDER — DAPAGLIFLOZIN PROPANEDIOL 10 MG PO TABS: 10.0000 mg | ORAL_TABLET | Freq: Every day | ORAL | 1 refills | Status: DC

## 2021-08-28 NOTE — Progress Notes (Signed)
Patient ID: [name]                 DOB: [birth year]                      MRN: [id number]     HPI: [name] is a 56 y.o. female referred by Gillian Shields to HTN clinic. PMH is significant for anxiety, bipolar disorder, HTN, depression, HFpEF, COPD, PTSD, and tobacco use.  EF on 05/17/21 65-70%.  Patient presents today for follow up. Reports PCP increased her carvedilol to 18.75mg  BID due to hypertension. Has not started [facility] 24-26 yet because it has not been mailed to her yet. Thinks it is coming this week. Has not been taking Entresto 97-103.  Reports she feels tired with frequent SOB.  Lower extremities have no swelling but she reports they are often swollen at night. Brought pictures and they do appear swollen.  Reports compliance with medications. Does not have any set schedule or time she takes medications.   Smoking between 1/2 to 1 ppd.  Current HTN meds:  Amlodipine 10mg  daily Carvedilol 12.5mg  BID Clonidine 0.2mg  BID Furosemide 40mg  daily Entresto 24-26mg  BID  BP goal: <130/80  Wt Readings from Last 3 Encounters:  07/19/21 240 lb 3.2 oz (109 kg)  05/19/21 229 lb 8 oz (104.1 kg)  04/28/20 218 lb 7.6 oz (99.1 kg)   BP Readings from Last 3 Encounters:  07/19/21 (!) 162/100  05/19/21 (!) 155/85  05/02/20 (!) 161/84   Pulse Readings from Last 3 Encounters:  07/19/21 62  05/19/21 71  05/02/20 78    Renal function: CrCl cannot be calculated (Unknown ideal weight.).  Past Medical History:  Diagnosis Date   Anxiety    Bipolar disorder (HCC)    Chronic back pain    DDD (degenerative disc disease), lumbar    Depression    Hypertension    Pre-diabetes    PTSD (post-traumatic stress disorder)     Current Outpatient Medications on File Prior to Visit  Medication Sig Dispense Refill   albuterol (VENTOLIN HFA) 108 (90 Base) MCG/ACT inhaler Inhale 2 puffs into the lungs every 6 (six) hours as needed for wheezing or shortness of breath. 8 g 3   amLODipine  (NORVASC) 10 MG tablet Take 1 tablet (10 mg total) by mouth daily. 30 tablet 1   aspirin EC 81 MG tablet Take 81 mg by mouth daily.     buPROPion (WELLBUTRIN) 100 MG tablet Take 100 mg by mouth daily.     carvedilol (COREG) 12.5 MG tablet Take 1 tablet (12.5 mg total) by mouth 2 (two) times daily with a meal. 180 tablet 3   cholecalciferol (VITAMIN D3) 25 MCG (1000 UNIT) tablet Take 2,000 Units by mouth daily.     cloNIDine (CATAPRES) 0.2 MG tablet Take 1 tablet (0.2 mg total) by mouth 2 (two) times daily. 180 tablet 3   Diclofenac Sodium 3 % GEL Apply 1 application topically daily as needed for pain.     doxycycline (ADOXA) 100 MG tablet Take 100 mg by mouth 2 (two) times daily.     fluconazole (DIFLUCAN) 150 MG tablet Take 150 mg by mouth once.     furosemide (LASIX) 40 MG tablet Take 1 tablet (40 mg total) by mouth daily. 90 tablet 3   ipratropium-albuterol (DUONEB) 0.5-2.5 (3) MG/3ML SOLN Take 3 mLs by nebulization 3 (three) times daily. 360 mL 2   naproxen (NAPROSYN) 500 MG tablet Take 500 mg by  mouth 2 (two) times daily.     OLANZapine (ZYPREXA) 10 MG tablet Take 10 mg by mouth at bedtime.     OLANZapine (ZYPREXA) 15 MG tablet Take 15 mg by mouth at bedtime.     oxybutynin (DITROPAN) 5 MG tablet Take 5 mg by mouth 3 (three) times daily.     potassium chloride (KLOR-CON M) 10 MEQ tablet Take 1 tablet (10 mEq total) by mouth every other day. (Patient not taking: Reported on 07/19/2021) 15 tablet 1   QUEtiapine (SEROQUEL) 400 MG tablet Take 400 mg by mouth at bedtime.   1   sacubitril-valsartan (ENTRESTO) 24-26 MG Take 1 tablet by mouth 2 (two) times daily. 180 tablet 3   topiramate (TOPAMAX) 200 MG tablet Take 200 mg by mouth daily.      traZODone (DESYREL) 100 MG tablet Take 200 mg by mouth at bedtime.      No current facility-administered medications on file prior to visit.    Allergies  Allergen Reactions   Adhesive [Tape] Rash     Assessment/Plan:  1. Hypertension -  Patient BP  in room 130/89 which is above goal of <130/80 but improved from previous visits.  Not on GDMT yet.   Advised to begin [place] as soon as it arrives in mail.  Will complete PA for SGLT2i and start which medication is covered by plan.  Will reduce carvedilol back to 12.5mg  BID as it is not first line treatment. Start MRA at next visit.  Discussed importance of self monitoring blood pressure, daily weights, monitoring for signs of swelling and signs of SOB.  Gave patient education handouts.  Recheck in 2 weeks.  Continue: Furosemide 40mg  daily Carvedilol 12.5mg  BID Start Entresto 24-26mg  BID when it arrives Start Farxiga/Jardiance once daily Check BMP in 2 weeks Recheck in 2 weeks  , PharmD, BCACP, CDCES, CPP [address] [place], [place], [place] Phone: [phone], Fax: [phone]

## 2021-08-28 NOTE — Patient Instructions (Addendum)
It was nice meeting you today  We would like your blood pressure to be less than 130/80  Please reduce your carvedilol back to 1 tablet twice a day  Continue your: Amlodipine 10mg  daily Carvedilol 12.5mg  twice a day Clonidine 0.2mg  twice a day Furosemide 40mg  daily  Please begin taking your Entresto 24-26mg  twice a day when it arrives in the mail  We will also start a new medication called Jardiance or Farxiga to help with your heart and kidneys  I will contact you when it is approved  Please call or message with any questions  , PharmD, BCACP, CDCES, CPP [address], [address] [place], [address], [place] Phone: [phone], Fax: [phone]

## 2021-08-30 NOTE — Progress Notes (Signed)
This encounter was created in error - please disregard.

## 2021-08-30 NOTE — Procedures (Signed)
Erroneous encounter

## 2021-09-04 ENCOUNTER — Ambulatory Visit: Payer: Medicare Other | Attending: Cardiology | Admitting: Cardiology

## 2021-09-04 DIAGNOSIS — G4730 Sleep apnea, unspecified: Secondary | ICD-10-CM | POA: Diagnosis not present

## 2021-09-04 DIAGNOSIS — I509 Heart failure, unspecified: Secondary | ICD-10-CM | POA: Diagnosis not present

## 2021-09-04 DIAGNOSIS — I110 Hypertensive heart disease with heart failure: Secondary | ICD-10-CM | POA: Diagnosis not present

## 2021-09-04 DIAGNOSIS — G4733 Obstructive sleep apnea (adult) (pediatric): Secondary | ICD-10-CM | POA: Diagnosis present

## 2021-09-10 NOTE — Procedures (Signed)
   Patient Name: [name], [name] Date: 09/04/2021 Gender: Female D.O.B: 1965-09-14 Age (years): 59 Referring Provider: Vara Guardian NP Height (inches): 62 Interpreting Physician: Armanda Magic MD, ABSM Weight (lbs): 164 RPSGT: Alfonso Ellis BMI: 30 MRN: [id number]   CLINICAL INFORMATION Sleep Study Type: HST  Indication for sleep study: N/A  Epworth Sleepiness Score: N/A  SLEEP STUDY TECHNIQUE A multi-channel overnight portable sleep study was performed. The channels recorded were: nasal airflow, thoracic respiratory movement, and oxygen saturation with a pulse oximetry. Snoring was also monitored.  MEDICATIONS Patient self administered medications include: N/A.  SLEEP ARCHITECTURE Patient was studied for 380.4 minutes. The sleep efficiency was 79.3 % and the patient was supine for 0%. The arousal index was 0.0 per hour.  RESPIRATORY PARAMETERS The overall AHI was 1.3 per hour, with a central apnea index of 0.2 per hour.  The oxygen nadir was 91% during sleep.  CARDIAC DATA Mean heart rate during sleep was 74.5 bpm.  IMPRESSIONS - No significant obstructive sleep apnea occurred during this study (AHI = 1.3/h). - No significant central sleep apnea occurred during this study (CAI = 0.2/h). - The patient had minimal or no oxygen desaturation during the study (Min O2 = 91%) - Patient snored 17.2% during the sleep.  DIAGNOSIS - Normal study  RECOMMENDATIONS - Avoid alcohol, sedatives and other CNS depressants that may worsen sleep apnea and disrupt normal sleep architecture. - Sleep hygiene should be reviewed to assess factors that may improve sleep quality. - Weight management and regular exercise should be initiated or continued.  [Electronically signed] 09/10/2021 04:12 PM  Armanda Magic MD, ABSM Diplomate, American Board of Sleep Medicine

## 2021-09-13 ENCOUNTER — Other Ambulatory Visit: Payer: Self-pay

## 2021-09-13 ENCOUNTER — Ambulatory Visit (INDEPENDENT_AMBULATORY_CARE_PROVIDER_SITE_OTHER): Payer: Medicare Other | Admitting: Pharmacist

## 2021-09-13 ENCOUNTER — Encounter: Payer: Self-pay | Admitting: Pharmacist

## 2021-09-13 VITALS — BP 138/95 | HR 69 | Wt 221.4 lb

## 2021-09-13 DIAGNOSIS — I509 Heart failure, unspecified: Secondary | ICD-10-CM | POA: Diagnosis not present

## 2021-09-13 LAB — BASIC METABOLIC PANEL
BUN/Creatinine Ratio: 12 (ref 9–23)
BUN: 21 mg/dL (ref 6–24)
CO2: 23 mmol/L (ref 20–29)
Calcium: 9.6 mg/dL (ref 8.7–10.2)
Chloride: 104 mmol/L (ref 96–106)
Creatinine, Ser: 1.74 mg/dL — ABNORMAL HIGH (ref 0.57–1.00)
Glucose: 128 mg/dL — ABNORMAL HIGH (ref 70–99)
Potassium: 4.1 mmol/L (ref 3.5–5.2)
Sodium: 140 mmol/L (ref 134–144)
eGFR: 34 mL/min/{1.73_m2} — ABNORMAL LOW (ref >59)

## 2021-09-13 NOTE — Patient Instructions (Addendum)
It was nice seeing you again  Please go to your pharmacy to pick up your [place].  You will take 1 tablet in the morning  Continue your:  Furosemide 40mg  daily Carvedilol 12.5mg  twice a day Entresto 24-26mg  twice a day Amlodipine 10 mg daily Clonidine 0.1mg  twice a day  We will check your lab work today to check your kidney function  Please continue to check your blood pressure at home  , PharmD, [place], CDCES, CPP [address] [place], [place], [place] Phone: [phone], Fax: [phone]

## 2021-09-13 NOTE — Progress Notes (Signed)
Patient ID: [name]                 DOB: [birth year]                      MRN: [id number]     HPI: [name] is a 56 y.o. female referred by Gillian Shields to HTN clinic. PMH is significant for anxiety, bipolar disorder, HTN, depression, HFpEF, COPD, PTSD, and tobacco use.  EF on 05/17/21 65-70%.  Patient presents today for follow up. Entresto 24-26 has arrived via mail and and she has been taking without adverse effects.  Unfortunately has not picked up [place] yet because she did not know it was ready at the pharmacy. Reports she will see if her ride can take her to pharmacy after appointment.   Continues to report feeling tired. Legs are not swollen but she says she still experiences swelling overnight.  Of note, patient is on multiple sedating psych medications.  Reports compliance with medications. Does not have any set schedule or time she takes medications.   Smoking between 1/2 to 1 ppd.  Current HTN meds:  Amlodipine 10mg  daily Carvedilol 12.5mg  BID Clonidine 0.2mg  BID Furosemide 40mg  daily Entresto 24-26mg  BID  BP goal: <130/80  Wt Readings from Last 3 Encounters:  08/28/21 229 lb (103.9 kg)  07/19/21 240 lb 3.2 oz (109 kg)  05/19/21 229 lb 8 oz (104.1 kg)   BP Readings from Last 3 Encounters:  08/28/21 130/89  07/19/21 (!) 162/100  05/19/21 (!) 155/85   Pulse Readings from Last 3 Encounters:  08/28/21 70  07/19/21 62  05/19/21 71    Renal function: CrCl cannot be calculated (Patient's most recent lab result is older than the maximum 21 days allowed.).  Past Medical History:  Diagnosis Date   Anxiety    Bipolar disorder (HCC)    Chronic back pain    DDD (degenerative disc disease), lumbar    Depression    Hypertension    Pre-diabetes    PTSD (post-traumatic stress disorder)     Current Outpatient Medications on File Prior to Visit  Medication Sig Dispense Refill   albuterol (VENTOLIN HFA) 108 (90 Base) MCG/ACT inhaler Inhale 2 puffs into the  lungs every 6 (six) hours as needed for wheezing or shortness of breath. 8 g 3   amLODipine (NORVASC) 10 MG tablet Take 1 tablet (10 mg total) by mouth daily. 30 tablet 1   aspirin EC 81 MG tablet Take 81 mg by mouth daily.     buPROPion (WELLBUTRIN) 100 MG tablet Take 100 mg by mouth daily.     carvedilol (COREG) 12.5 MG tablet Take 1 tablet (12.5 mg total) by mouth 2 (two) times daily with a meal. 180 tablet 3   cholecalciferol (VITAMIN D3) 25 MCG (1000 UNIT) tablet Take 2,000 Units by mouth daily.     cloNIDine (CATAPRES) 0.2 MG tablet Take 1 tablet (0.2 mg total) by mouth 2 (two) times daily. 180 tablet 3   dapagliflozin propanediol (FARXIGA) 10 MG TABS tablet Take 1 tablet (10 mg total) by mouth daily. 90 tablet 1   Diclofenac Sodium 3 % GEL Apply 1 application topically daily as needed for pain.     furosemide (LASIX) 40 MG tablet Take 1 tablet (40 mg total) by mouth daily. 90 tablet 3   ipratropium-albuterol (DUONEB) 0.5-2.5 (3) MG/3ML SOLN Take 3 mLs by nebulization 3 (three) times daily. 360 mL 2   OLANZapine (ZYPREXA) 15 MG tablet  Take 15 mg by mouth at bedtime.     oxybutynin (DITROPAN) 5 MG tablet Take 5 mg by mouth 3 (three) times daily.     QUEtiapine (SEROQUEL) 400 MG tablet Take 400 mg by mouth at bedtime.   1   sacubitril-valsartan (ENTRESTO) 24-26 MG Take 1 tablet by mouth 2 (two) times daily. 180 tablet 3   topiramate (TOPAMAX) 200 MG tablet Take 200 mg by mouth daily.      traZODone (DESYREL) 100 MG tablet Take 200 mg by mouth at bedtime.      No current facility-administered medications on file prior to visit.    Allergies  Allergen Reactions   Adhesive [Tape] Rash     Assessment/Plan 1. Hypertension -  Patient BP in room 138/95 which is above goal of <130/80 but improved from previous visits.  Not on GDMT yet.   Has started [facility] but has not picked up [place] yet. Advised to pick up from pharmacy and begin 1 tablet in the morning.  Discussed importance of self  monitoring blood pressure, daily weights, monitoring for signs of swelling and signs of SOB.  Gave patient education handouts.  Recheck in 2 weeks.  Did not update lab work at last visit due to her not starting Entresto. Will update today.  Continue: Furosemide 40mg  daily Carvedilol 12.5mg  BID Entresto 24-26mg  BID Start Farxiga 10mg  daily Check BMP today Recheck in 2 weeks  , PharmD, BCACP, CDCES, CPP [address], [address] [place], [address], [place] Phone: [phone], Fax: [phone]

## 2021-09-17 ENCOUNTER — Telehealth (HOSPITAL_BASED_OUTPATIENT_CLINIC_OR_DEPARTMENT_OTHER): Payer: Self-pay

## 2021-09-17 DIAGNOSIS — I10 Essential (primary) hypertension: Secondary | ICD-10-CM

## 2021-09-17 NOTE — Telephone Encounter (Addendum)
Results called to patient who verbalizes understanding! Labs mailed to patient.     ----- Message from Alver Sorrow, NP sent at 09/14/2021 12:52 PM EDT ----- Creatinine with slight increase which is expected with Entresto. Continue current medications. Ensure she picks up [place] from the pharmacy. This will help treat her heart failure and protect her kidneys.  Recommend repeat BMP in 2 weeks.

## 2021-09-20 ENCOUNTER — Telehealth: Payer: Self-pay | Admitting: *Deleted

## 2021-09-20 DIAGNOSIS — G4730 Sleep apnea, unspecified: Secondary | ICD-10-CM

## 2021-09-20 DIAGNOSIS — I509 Heart failure, unspecified: Secondary | ICD-10-CM

## 2021-09-20 DIAGNOSIS — I10 Essential (primary) hypertension: Secondary | ICD-10-CM

## 2021-09-20 DIAGNOSIS — I5031 Acute diastolic (congestive) heart failure: Secondary | ICD-10-CM

## 2021-09-20 NOTE — Telephone Encounter (Signed)
The patient has been notified of the result and verbalized understanding.  All questions (if any) were answered. Latrelle Dodrill, CMA 09/20/2021 1:12 PM    PRECERT NPSG

## 2021-09-20 NOTE — Telephone Encounter (Signed)
-----   Message from Gaynelle Cage, [place] sent at 09/18/2021  9:10 AM EDT -----  ----- Message ----- From: Quintella Reichert, MD Sent: 09/10/2021   4:15 PM EDT To: [facility] Studies  Normal home sleep study so in lab PSG will be ordered

## 2021-09-24 ENCOUNTER — Telehealth: Payer: Self-pay | Admitting: *Deleted

## 2021-09-24 NOTE — Telephone Encounter (Signed)
Left voice mail regarding August PREP class at the [facility]. 

## 2021-10-01 ENCOUNTER — Other Ambulatory Visit: Payer: Self-pay | Admitting: Family

## 2021-10-02 NOTE — Telephone Encounter (Signed)
Rx request sent to pharmacy.  

## 2021-10-17 ENCOUNTER — Ambulatory Visit: Payer: Medicare Other

## 2021-11-01 ENCOUNTER — Ambulatory Visit: Payer: Medicare Other | Attending: Cardiology | Admitting: Pharmacist Clinician (PhC)/ Clinical Pharmacy Specialist

## 2021-11-01 ENCOUNTER — Other Ambulatory Visit: Payer: Self-pay | Admitting: Family

## 2021-11-01 DIAGNOSIS — I509 Heart failure, unspecified: Secondary | ICD-10-CM | POA: Diagnosis not present

## 2021-11-01 MED ORDER — FUROSEMIDE 20 MG PO TABS: ORAL_TABLET | ORAL | 3 refills | Status: DC

## 2021-11-01 MED ORDER — FUROSEMIDE 40 MG PO TABS: ORAL_TABLET | ORAL | 3 refills | Status: DC

## 2021-11-01 NOTE — Patient Instructions (Signed)
Return for a a follow up appointment Tuesday Oct 18 at 8:30 with Thayer Ohm or [facility]  Check your blood pressure at home 2-3 times each week and keep record of the readings.  Take your BP meds as follows:  Restart furosemide - take 40 mg daily for 3 days, then try to decrease the dose to 20 mg once daily.  If you see more than 3 pound weight gain in 1 day or 5 pounds in a week, increase to the 40 mg dose for 3 days.    Bring all of your meds, your BP cuff and your record of home blood pressures to your next appointment.  Exercise as you're able, try to walk approximately 30 minutes per day.  Keep salt intake to a minimum, especially watch canned and prepared boxed foods.  Eat more fresh fruits and vegetables and fewer canned items.  Avoid eating in fast food restaurants.    HOW TO TAKE YOUR BLOOD PRESSURE: Rest 5 minutes before taking your blood pressure.  Don't smoke or drink caffeinated beverages for at least 30 minutes before. Take your blood pressure before (not after) you eat. Sit comfortably with your back supported and both feet on the floor (don't cross your legs). Elevate your arm to heart level on a table or a desk. Use the proper sized cuff. It should fit smoothly and snugly around your bare upper arm. There should be enough room to slip a fingertip under the cuff. The bottom edge of the cuff should be 1 inch above the crease of the elbow. Ideally, take 3 measurements at one sitting and record the average.

## 2021-11-01 NOTE — Progress Notes (Unsigned)
11/03/2021 [name] 07-Sep-1965 [id number]   HPI:  [name] is a 56 y.o. female with a PMH below who presents today for heart failure medication titration (HFpEF - grade 2 diastolic dysfunction, severe LVH))  She was hospitalized in late march with hypertensive urgency and shortness of breath.  She was found to have pulmonary edema, BNP elevated to 356.  She had not seen a provider in over a year and had run out of some of her medications.  She was seen in June by Gillian Shields and medications were adjusted.  It was noted to try and wean her off the clonidine, as any compliance issues could lead to rebound hypertension.  She was then seen by Laural Golden PharmD on 2 occasions.  At the first, she was not on Entresto, as she was waiting for it to arrive by mail.  She was started on Farxiga and encouraged to watch her weight and BP to avoid further hospitalizations.  Today she returns for follow up.  She has been without her furosemide for a few weeks now and also complains of severe pain in her left hip.  She has had prior injections to help with the pain, (states at a 10 today), but her MD recently moved and she is waiting to get in at new clinic.   States she has been using Voltaren gel and alcohol rubs to help.  Also states some chest tightness since stopping her furosemide, but hasn't been bothersome enough to seek help.     Past Medical History: CKD 09/13/21  SCR 1.74, GFR 31  hypertension Treating with HF - GDMT  Pre-diabetes 3/23 A2c 5.9 (down from 6.3)  Tobacco abuse      Blood Pressure Goal:  130/80  Current Medications:    amlodipine 10, carvedilol 12.5, clondine 0.2 bid, Entresto 24/26, lasix 40  Family Hx:   mother deceased, lung cancer, DM; father living, DM, pancreatic and prostate cancers;    Social Hx:   current smoker, about 1/2 ppd; occasional alcohol   Exercise: limited by hip pain  Home BP readings: no readings with her today     Intolerances:  no  cardiac medication intolerances  Labs: 09/13/21:  Na 140, K 4.1, Glu 128, BUN 21, SCr 1.74, GFR 34   Wt Readings from Last 3 Encounters:  11/01/21 224 lb (101.6 kg)  09/13/21 221 lb 6.4 oz (100.4 kg)  08/28/21 229 lb (103.9 kg)   BP Readings from Last 3 Encounters:  11/01/21 (!) 154/102  09/13/21 (!) 138/95  08/28/21 130/89   Pulse Readings from Last 3 Encounters:  11/01/21 64  09/13/21 69  08/28/21 70    Current Outpatient Medications  Medication Sig Dispense Refill   albuterol (VENTOLIN HFA) 108 (90 Base) MCG/ACT inhaler Inhale 2 puffs into the lungs every 6 (six) hours as needed for wheezing or shortness of breath. 8 g 3   amLODipine (NORVASC) 10 MG tablet Take 1 tablet (10 mg total) by mouth daily. 30 tablet 1   aspirin EC 81 MG tablet Take 81 mg by mouth daily.     buPROPion (WELLBUTRIN) 100 MG tablet Take 100 mg by mouth daily.     cholecalciferol (VITAMIN D3) 25 MCG (1000 UNIT) tablet Take 2,000 Units by mouth daily.     cloNIDine (CATAPRES) 0.2 MG tablet Take 1 tablet (0.2 mg total) by mouth 2 (two) times daily. 180 tablet 3   dapagliflozin propanediol (FARXIGA) 10 MG TABS tablet Take 1  tablet (10 mg total) by mouth daily. 90 tablet 1   Diclofenac Sodium 3 % GEL Apply 1 application topically daily as needed for pain.     ENTRESTO 24-26 MG TAKE ONE (1) TABLET BY MOUTH TWICE DAILY 60 tablet 10   furosemide (LASIX) 20 MG tablet Take 1 tablet by mouth daily.  Can take 2 tablets x 3 days as needed for swelling 120 tablet 3   ipratropium-albuterol (DUONEB) 0.5-2.5 (3) MG/3ML SOLN Take 3 mLs by nebulization 3 (three) times daily. 360 mL 2   OLANZapine (ZYPREXA) 10 MG tablet Take 10 mg by mouth at bedtime.     oxybutynin (DITROPAN) 5 MG tablet Take 5 mg by mouth 3 (three) times daily.     QUEtiapine (SEROQUEL) 400 MG tablet Take 400 mg by mouth at bedtime.   1   topiramate (TOPAMAX) 200 MG tablet Take 200 mg by mouth daily.      traZODone (DESYREL) 100 MG tablet Take 200 mg by  mouth at bedtime.      carvedilol (COREG) 12.5 MG tablet TAKE ONE (1) TABLET BY MOUTH TWICE DAILY 60 tablet 11   No current facility-administered medications for this visit.    Allergies  Allergen Reactions   Adhesive [Tape] Rash    Past Medical History:  Diagnosis Date   Anxiety    Bipolar disorder (HCC)    Chronic back pain    DDD (degenerative disc disease), lumbar    Depression    Hypertension    Pre-diabetes    PTSD (post-traumatic stress disorder)     Blood pressure (!) 154/102, pulse 64, height 5' 4.5" (1.638 m), weight 224 lb (101.6 kg).   Acute heart failure preserved EF Patient with HFpEF, currently out of furosemide for at least 1-2 weeks.  BP elevated in office, probably secondary to medication compliance issues and ongoing hip pain.  Will refill furosemide today, and in light of renal function, will have her take 40 mg daily for 3 days, then take 20 mg daily thereafter.   She was asked to do daily weights and watch for 3 pound increases in one day or 5 pounds in a week.  She is scheduled to see Dr. Wyline Mood at the end of this month, and we can continue to follow as needed after that.     Phillips Hay PharmD CPP [facility] [address] [place], [place] [address] [phone]

## 2021-11-03 ENCOUNTER — Encounter: Payer: Self-pay | Admitting: Pharmacist Clinician (PhC)/ Clinical Pharmacy Specialist

## 2021-11-03 NOTE — Assessment & Plan Note (Signed)
Patient with HFpEF, currently out of furosemide for at least 1-2 weeks.  BP elevated in office, probably secondary to medication compliance issues and ongoing hip pain.  Will refill furosemide today, and in light of renal function, will have her take 40 mg daily for 3 days, then take 20 mg daily thereafter.   She was asked to do daily weights and watch for 3 pound increases in one day or 5 pounds in a week.  She is scheduled to see Dr. Harl Bowie at the end of this month, and we can continue to follow as needed after that.

## 2021-11-06 ENCOUNTER — Other Ambulatory Visit (HOSPITAL_COMMUNITY): Payer: Self-pay | Admitting: Adult Health

## 2021-11-06 DIAGNOSIS — Z1231 Encounter for screening mammogram for malignant neoplasm of breast: Secondary | ICD-10-CM

## 2021-11-14 ENCOUNTER — Ambulatory Visit (HOSPITAL_COMMUNITY)
Admission: RE | Admit: 2021-11-14 | Discharge: 2021-11-14 | Disposition: A | Discharge status: Home / Self Care | Payer: Medicare Other | Source: Ambulatory Visit | Attending: Adult Health | Admitting: Adult Health

## 2021-11-14 DIAGNOSIS — Z1231 Encounter for screening mammogram for malignant neoplasm of breast: Secondary | ICD-10-CM | POA: Insufficient documentation

## 2021-11-16 ENCOUNTER — Ambulatory Visit: Payer: Medicare Other | Attending: Internal Medicine | Admitting: Internal Medicine

## 2021-11-16 ENCOUNTER — Encounter: Payer: Self-pay | Admitting: Internal Medicine

## 2021-11-16 VITALS — BP 118/80 | HR 73 | Ht 64.0 in | Wt 229.0 lb

## 2021-11-16 DIAGNOSIS — I5032 Chronic diastolic (congestive) heart failure: Secondary | ICD-10-CM

## 2021-11-16 NOTE — Patient Instructions (Signed)
Medication Instructions:  No Changes In Medications at this time.  *If you need a refill on your cardiac medications before your next appointment, please call your pharmacy*  Follow-Up: At [facility], you and your health needs are our priority.  As part of our continuing mission to provide you with exceptional heart care, we have created designated Provider Care Teams.  These Care Teams include your primary Cardiologist (physician) and Advanced Practice Providers (APPs -  Physician Assistants and Nurse Practitioners) who all work together to provide you with the care you need, when you need it.  [place] Your next appointment:   1 year(s)  The format for your next appointment:   In Person  Provider:   Janina Mayo, MD
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
## 2021-12-04 ENCOUNTER — Ambulatory Visit: Payer: Medicare Other

## 2022-01-24 ENCOUNTER — Other Ambulatory Visit: Payer: Self-pay | Admitting: Family

## 2022-01-24 DIAGNOSIS — I5032 Chronic diastolic (congestive) heart failure: Secondary | ICD-10-CM

## 2022-01-25 NOTE — Telephone Encounter (Signed)
Patient of Dr. Tereso Newcomer. Please review for refill. Thank you!

## 2022-02-19 ENCOUNTER — Ambulatory Visit: Payer: Medicare Other

## 2022-03-11 ENCOUNTER — Ambulatory Visit: Payer: 59

## 2022-03-11 IMAGING — DX DG FOOT COMPLETE 3+V*R*
3 series · 3 of 3 positions shown · non-contrast
Comparison: None.

CLINICAL DATA: Osteoarthritis of both feet.

EXAM:
LEFT FOOT - COMPLETE 3+ VIEW; RIGHT FOOT COMPLETE - 3+ VIEW

[foot ap]
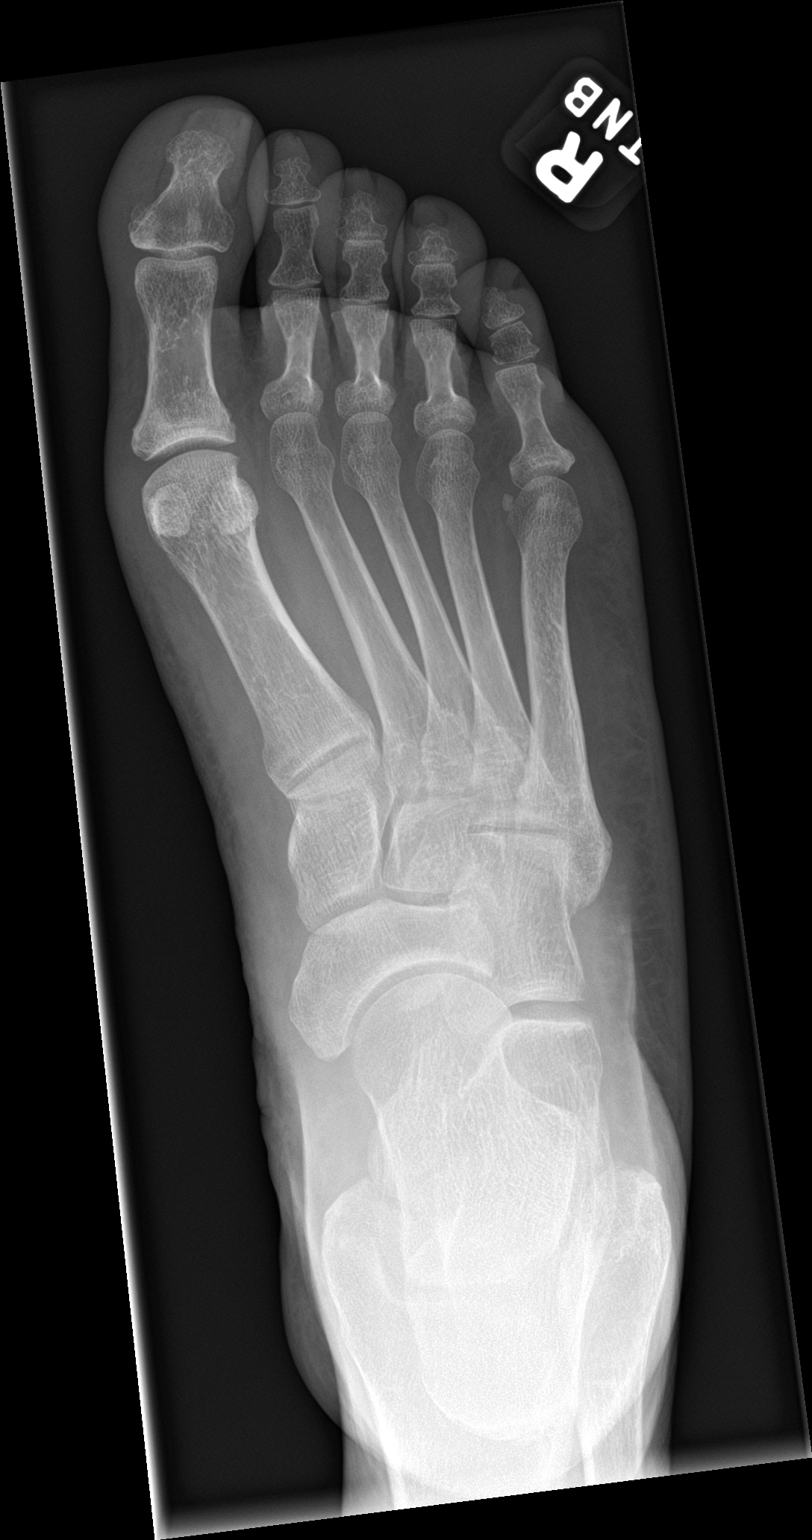

[foot obl]
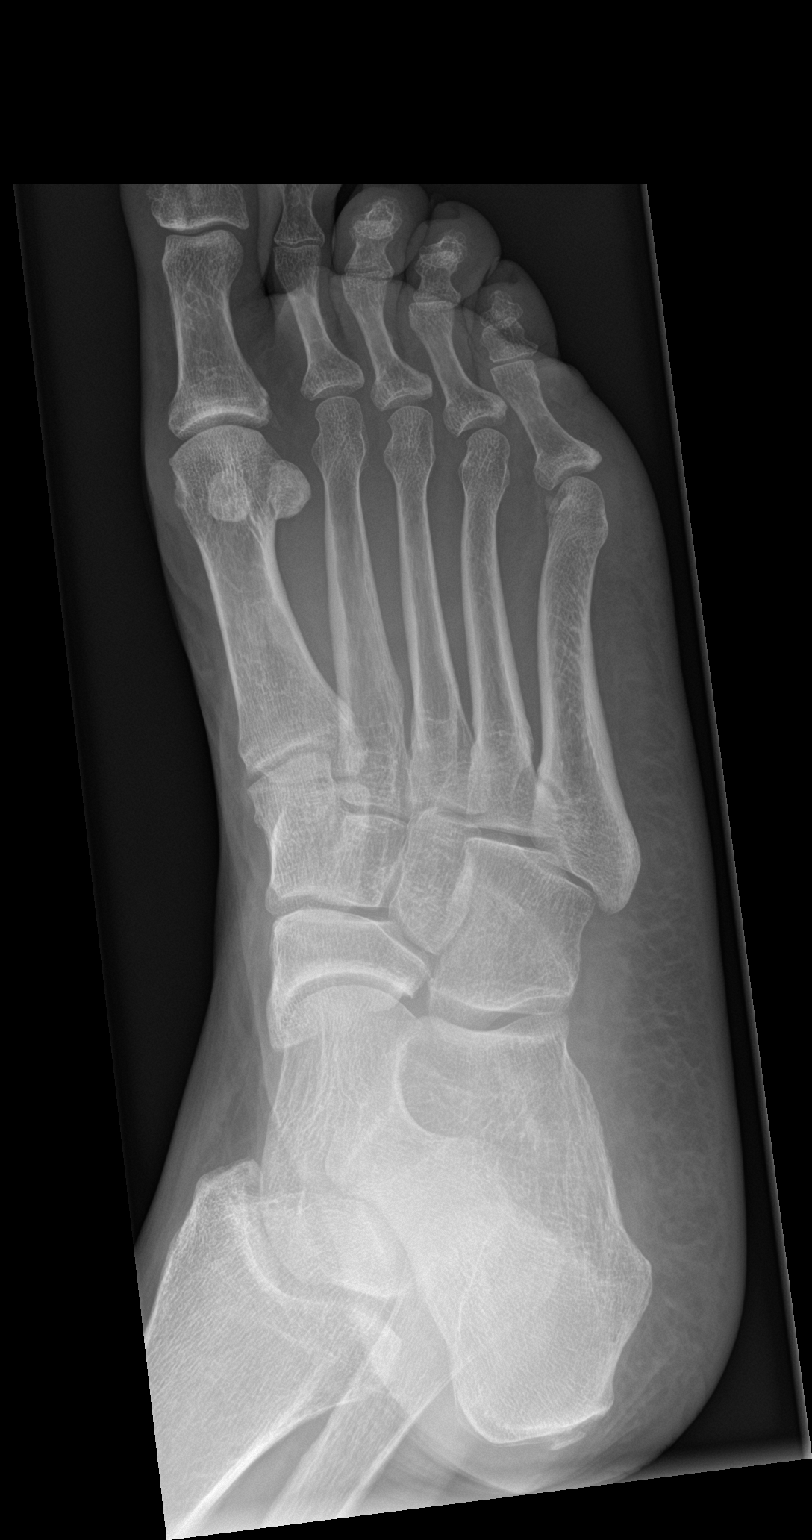

[foot lat]
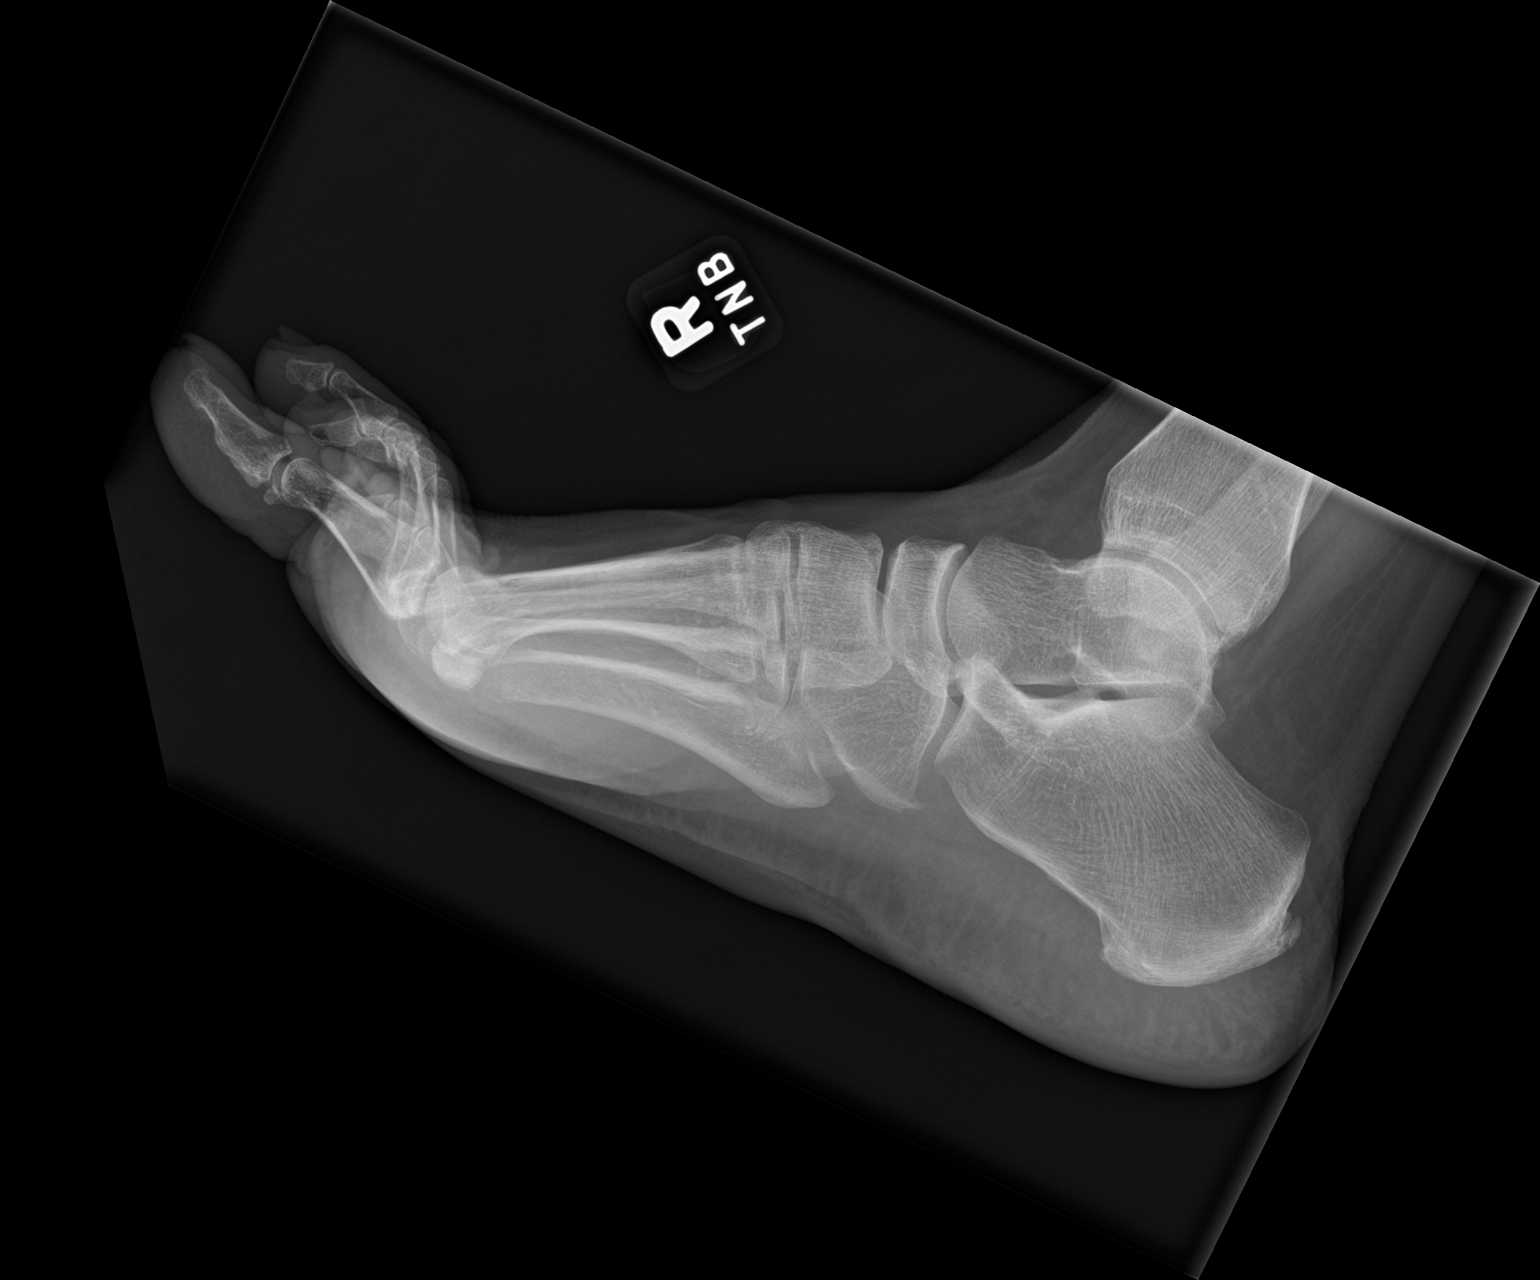

[3 of 3 positions shown; findings below may reference images not displayed]

FINDINGS: Right foot: The toes are hyperextended. The mineralization and
alignment are otherwise normal. The joint spaces are preserved.
There is no evidence of acute fracture, dislocation or erosive
change. Small posterior calcaneal spur.

Left foot: The toes are hyper extended. The mineralization and
alignment are otherwise normal. The joint spaces are preserved.
There is no evidence of acute fracture, dislocation or erosive
change.
IMPRESSION: No acute findings or evidence of inflammatory arthropathy.
Hyperextension of the toes bilaterally on the lateral views,
possibly positional.

## 2022-04-02 ENCOUNTER — Ambulatory Visit: Payer: 59

## 2022-04-16 ENCOUNTER — Other Ambulatory Visit (HOSPITAL_COMMUNITY): Payer: Self-pay | Admitting: Adult Health

## 2022-04-16 ENCOUNTER — Ambulatory Visit (HOSPITAL_COMMUNITY)
Admission: RE | Admit: 2022-04-16 | Discharge: 2022-04-16 | Disposition: A | Discharge status: Home / Self Care | Payer: 59 | Source: Ambulatory Visit | Attending: Adult Health | Admitting: Adult Health

## 2022-04-16 DIAGNOSIS — M25551 Pain in right hip: Secondary | ICD-10-CM

## 2022-04-19 ENCOUNTER — Ambulatory Visit: Payer: 59 | Attending: Cardiology | Admitting: Pharmacist

## 2022-04-19 VITALS — BP 148/93 | HR 69 | Wt 243.8 lb

## 2022-04-19 DIAGNOSIS — Z720 Tobacco use: Secondary | ICD-10-CM

## 2022-04-19 DIAGNOSIS — I10 Essential (primary) hypertension: Secondary | ICD-10-CM

## 2022-04-19 DIAGNOSIS — I509 Heart failure, unspecified: Secondary | ICD-10-CM | POA: Diagnosis not present

## 2022-04-19 MED ORDER — FUROSEMIDE 20 MG PO TABS: ORAL_TABLET | ORAL | 3 refills | Status: AC

## 2022-04-19 MED ORDER — NICOTINE 21 MG/24HR TD PT24: 21.0000 mg | MEDICATED_PATCH | Freq: Every day | TRANSDERMAL | 5 refills | Status: DC

## 2022-04-19 MED ORDER — CLONIDINE HCL 0.2 MG PO TABS: 0.2000 mg | ORAL_TABLET | Freq: Two times a day (BID) | ORAL | 3 refills | Status: AC

## 2022-04-19 MED ORDER — AMLODIPINE BESYLATE 10 MG PO TABS: 10.0000 mg | ORAL_TABLET | Freq: Every day | ORAL | 1 refills | Status: AC

## 2022-04-19 NOTE — Progress Notes (Unsigned)
Patient ID: [name]                 DOB: [birth year]                      MRN: [id number]     HPI: [name] is a 57 y.o. female referred by Dr. Harl Bowie to pharmacy clinic for HF medication management. PMH is significant for CKD, smoking, COPD, HFpEF, and HTN. Most recent LVEF 65-70% on 05/17/21.  Patient presents today with sun. Has noticed occasional increased swelling in lower extremities especially after recent trip to [place]. However also reports diet was different. No swelling currently. Is taking furosemide 5 times weekly. Reports urinary frequency and urgency.  Continues to smoke 1/2-1 ppd. Is interested in quitting.  Brought medications and requests refills including BH medications.  Discussion with patient today included the following: cardiac medication indications, review of GDMT including reasoning behind medication titration, importance of medication adherence, and patient engagement. Denies dizziness, fatigue, LE edema, or SOB. Able to complete all ADLs.   Reports BP at recent primary care visit was well controlled. Do not have records. Has not been checking at home.  Current CHF meds:  Carvedilol 12.'5mg'$  BID Farxiga '10mg'$  daily Entresto 24-'26mg'$  BID Furosemide '20mg'$  daily  Current HTN Meds: Amlodipine '10mg'$  daily Clonidine 0.'2mg'$  BID  BP goal: <130/80   Wt Readings from Last 3 Encounters:  04/19/22 243 lb 12.8 oz (110.6 kg)  11/16/21 229 lb (103.9 kg)  11/01/21 224 lb (101.6 kg)   BP Readings from Last 3 Encounters:  04/19/22 (!) 148/93  11/16/21 118/80  11/01/21 (!) 154/102   Pulse Readings from Last 3 Encounters:  04/19/22 69  11/16/21 73  11/01/21 64    Renal function: CrCl cannot be calculated (Patient's most recent lab result is older than the maximum 21 days allowed.).  Past Medical History:  Diagnosis Date   Anxiety    Bipolar disorder (HCC)    Chronic back pain    DDD (degenerative disc disease), lumbar    Depression    Hypertension     Pre-diabetes    PTSD (post-traumatic stress disorder)     Current Outpatient Medications on File Prior to Visit  Medication Sig Dispense Refill   atorvastatin (LIPITOR) 10 MG tablet Take 10 mg by mouth daily.     albuterol (VENTOLIN HFA) 108 (90 Base) MCG/ACT inhaler Inhale 2 puffs into the lungs every 6 (six) hours as needed for wheezing or shortness of breath. 8 g 3   aspirin EC 81 MG tablet Take 81 mg by mouth daily.     buPROPion (WELLBUTRIN) 100 MG tablet Take 100 mg by mouth daily.     carvedilol (COREG) 12.5 MG tablet TAKE ONE (1) TABLET BY MOUTH TWICE DAILY 60 tablet 11   cholecalciferol (VITAMIN D3) 25 MCG (1000 UNIT) tablet Take 2,000 Units by mouth daily.     Diclofenac Sodium 3 % GEL Apply 1 application topically daily as needed for pain.     ENTRESTO 24-26 MG TAKE ONE (1) TABLET BY MOUTH TWICE DAILY 60 tablet 10   FARXIGA 10 MG TABS tablet TAKE 1 TABLET BY MOUTH EVERY DAY 30 tablet 10   ipratropium-albuterol (DUONEB) 0.5-2.5 (3) MG/3ML SOLN Take 3 mLs by nebulization 3 (three) times daily. 360 mL 2   OLANZapine (ZYPREXA) 10 MG tablet Take 10 mg by mouth at bedtime.     oxybutynin (DITROPAN) 5 MG tablet Take 5 mg by mouth 3 (three)  times daily.     QUEtiapine (SEROQUEL) 400 MG tablet Take 400 mg by mouth at bedtime.   1   topiramate (TOPAMAX) 200 MG tablet Take 200 mg by mouth daily.      traZODone (DESYREL) 100 MG tablet Take 200 mg by mouth at bedtime.      No current facility-administered medications on file prior to visit.    Allergies  Allergen Reactions   Adhesive [Tape] Rash     Assessment/Plan:  1. CHF -   HYPERTENSION CONTROL Vitals:   04/19/22 0848 04/19/22 0904  BP: (!) 148/86 (!) 148/93    The patient's blood pressure is elevated above target today.  In order to address the patient's elevated BP: Blood pressure will be monitored at home to determine if medication changes need to be made.; A referral to the [facility] will be placed.     Patient BP in room elevated today above goal of <130/80. Discussed options with patient such as increasing Entresto with a close eye on her renal function. Also advised that she may decrease Lasix to 4 days a week if she feels she does not need it. Patient hesistant to increase Entresto due to recent normal BP at PCP office. Advised for her to monitor at home and will recheck in 1 month.  Congratulated patient on decision to consider smoking cessation. Has used patches before and is interested in restarting. Rx sent to pharmacy.   Karren Cobble, PharmD, BCACP, [place], [place], [place], [place], [address] Phone: [phone], Fax: [phone]

## 2022-04-19 NOTE — Patient Instructions (Addendum)
It was good seeing you again  We would like your blood pressure to be less than 130/80  We will follow up next month and if still elevated, we will discuss increasing your Entresto  Please continue your:  Carvedilol 12.'5mg'$  twice a day Farxiga '10mg'$  daily Entresto 24-'26mg'$  twice a day Furosemide '20mg'$  daily Amlodipine '10mg'$  daily Clonidine 0.'2mg'$  twice a day  I have also sent in a starter dose of Nicotine patches  We will see you back next month  Karren Cobble, PharmD, [place], [place], [place], [place], [place], [address] Phone: [phone], Fax: [phone]

## 2022-04-22 ENCOUNTER — Encounter: Payer: Self-pay | Admitting: Pharmacist

## 2022-05-08 ENCOUNTER — Telehealth: Payer: Self-pay | Admitting: Internal Medicine

## 2022-05-08 NOTE — Telephone Encounter (Signed)
Call to patient and gave information.  She is very argumentative because she doesn't have an exact date.  She has no visits anywhere between hospital and cardiology.  She states this is due to waiting on the hospital to get her an appt. Then she states because her PCP was setting this up.  However she states not seeing her PCP anywhere in this time frame. Advised her date for ER and hospital was 05/17/21 and her first appt. With cardiology was 07/19/21. She will use this for her insurance.

## 2022-05-08 NOTE — Telephone Encounter (Signed)
     Patient states she is replying for insurance and needed the medication that were prescribed her first  office [facility] visit  RN gave patient the below information. 07/19/21 Medication Instructions:    Your physician has recommended you make the following change in your medication:    STOP Metoprolol   START Carvedilol one 12.5mg  tablet twice daily   CHANGE Clonidine to 0.2mg  twice daily   CHANGE Furosemide (Lasix) to 2 tablets (80mg ) daily for 3 days THEN return to 1 tablet (40mg  ) daily    She staes she will use diagnosis from Hospitalization from March  2023

## 2022-05-08 NOTE — Telephone Encounter (Signed)
Patient would like to know the date she was diagnosed and with what heart condition.  She has applied for insurance and denied because she did not give correct information.  She needs the DX with correct dates to re file.

## 2022-05-08 NOTE — Telephone Encounter (Signed)
Patient is calling to get information on the medication she was taking on 07/19/2021. Requesting call back.

## 2022-05-08 NOTE — Telephone Encounter (Signed)
Pt is wanting to know when they were diagnosed with a heart condition

## 2022-05-16 ENCOUNTER — Telehealth: Payer: Self-pay | Admitting: *Deleted

## 2022-05-16 NOTE — Telephone Encounter (Signed)
   Pre-operative Risk Assessment    Patient Name: [name]  DOB: 1965-03-17 MRN: [id number]     Request for Surgical Clearance    Procedure:  Dental Extraction - Amount of Teeth to be Pulled:  1  Date of Surgery:  Clearance TBD                                 Surgeon:  DR. Veva Holes Surgeon's Group or Practice Name:  [facility] Phone number:  [phone] Fax number:  [phone]   Type of Clearance Requested:   - Medical    Type of Anesthesia:   PT IS REQUESTING IV ANESTHESIA USING VERSE & NARCOTIC, IS THAT OK?   Additional requests/questions:    Signed, Jeanann Lewandowsky   05/16/2022, 3:30 PM

## 2022-05-16 NOTE — Telephone Encounter (Signed)
   Patient Name: [name]  DOB: January 12, 1966 MRN: [id number]  Primary Cardiologist: Janina Mayo, MD  Chart reviewed as part of pre-operative protocol coverage.   Simple dental extractions (i.e. 1-2 teeth) are considered low risk procedures per guidelines and generally do not require any specific cardiac clearance. It is also generally accepted that for simple extractions and dental cleanings, there is no need to interrupt blood thinner therapy.  SBE prophylaxis is not required for the patient from a cardiac standpoint.  I will route this recommendation to the requesting party via Epic fax function and remove from pre-op pool.  Please call with questions.  Lenna Sciara, NP 05/16/2022, 3:38 PM

## 2022-05-23 ENCOUNTER — Telehealth: Payer: Self-pay | Admitting: Internal Medicine

## 2022-05-23 NOTE — Telephone Encounter (Signed)
    Primary Cardiologist: Janina Mayo, MD  Chart reviewed as part of pre-operative protocol coverage. Dental extractions are considered low risk procedures per guidelines and generally do not require any specific cardiac clearance. There is no need to interrupt blood thinner therapy for 1 dental extraction.   SBE prophylaxis is not required for the patient.  Cardiology is unable to provide recommendations on narcotic prescription.  Recommendations for narcotic recommendations should come from patient's PCP.  Recommendations for anesthesia cannot be recommended by cardiology.  Risk versus benefit evaluation and recommendations will need to be discussed with patient by administering provider.  I will route this recommendation to the requesting party via Epic fax function and remove from pre-op pool.  Please call with questions.  Deberah Pelton, NP 05/23/2022, 10:58 AM

## 2022-05-23 NOTE — Telephone Encounter (Signed)
I s/w Alexis with dental office. Per Ubaldo Glassing the clearance form stated the extraction was a surgical extraction, not simple extraction.   Also states the clearance we sent back did not address the anesthesia questions.  Pt asked to have IV  anesthesia using versed along with narcotic. I asked what narcotic are they using. DDS office states they may use fentanyl or ketamine. I assured the DDS office I will update the pre op APP and we will fax new clearance with correction made.

## 2022-05-23 NOTE — Telephone Encounter (Signed)
Alexis from [facility] called stating that the surgeon had a couple questions based upon the response they received from [place]. The surgeon was asking about Surgical Extraction not Simple Extraction. They also wanted to know if IV Anesthesia Verse and Narcotic is okay to use? Ubaldo Glassing gave a number of [phone] and a fax number of [phone]. Please advise.

## 2022-05-27 ENCOUNTER — Telehealth: Payer: Self-pay | Admitting: Internal Medicine

## 2022-05-27 NOTE — Telephone Encounter (Signed)
Pt called in requesting to know why she was not given permission to be put under from our office, for her surgical extraction today for her dentist appt at 12:30. She states this doesn't make sense to her since she needs clearance from her cardiologist. She said a message can be left at [phone] if she isn't available. She states she'll be leaving her home around 9:30, and then again at 11:30.

## 2022-05-27 NOTE — Telephone Encounter (Signed)
See clearance notes  

## 2022-05-27 NOTE — Telephone Encounter (Signed)
  Pt called in requesting to know why she was not given permission to be put under from our office, for her surgical extraction today for her dentist appt at 12:30. She states this doesn't make sense to her since she needs clearance from her cardiologist. She said a message can be left at [phone] if she isn't available. She states she'll be leaving her home around 9:30, and then again at 11:30.       Note   Debra, [name] [phone]  Jeralyn Ruths M28 minutes ago (8:44 AM)        I called the pt back and explained to her that cardiology did not deny her clearance for her dental procedure. I read notes to the pt and explained that cardiology will not provide recommendations in regard to the anesthesia being used along with using narcotics, that this clearance for the anesthesia will need to come from PCP as cardiology does not give any recommendations when it comes to narcotics. I read the other parts of the clearance that she does not need to hold her blood thinner, nor does she need SBE/ABX, that we have provided clearance for the procedure, but again said for the anesthesia DDS needs to reach out to PCP. I will re-fax these notes to DDS. Pt apologized for the confusion. I stated no need to apologize. I stated that I felt notes may not have been relayed correctly. Pt thanked me for taking the time to go over this with her in detail.

## 2022-05-31 ENCOUNTER — Encounter: Payer: Self-pay | Admitting: Pharmacist

## 2022-05-31 ENCOUNTER — Ambulatory Visit: Payer: 59 | Attending: Cardiology | Admitting: Pharmacist

## 2022-05-31 VITALS — BP 139/88 | HR 70 | Wt 238.4 lb

## 2022-05-31 DIAGNOSIS — I5031 Acute diastolic (congestive) heart failure: Secondary | ICD-10-CM | POA: Diagnosis not present

## 2022-05-31 MED ORDER — ISOSORB DINITRATE-HYDRALAZINE 20-37.5 MG PO TABS: 1.0000 | ORAL_TABLET | Freq: Three times a day (TID) | ORAL | 2 refills | Status: DC

## 2022-05-31 NOTE — Progress Notes (Signed)
Patient ID: [name]                 DOB: October 03, 1965                      MRN: [id number]     HPI: [name] is a 57 y.o. female referred by Dr. Wyline Mood to pharmacy clinic for HF medication management. PMH is significant for CKD, smoking, COPD, HFpEF, and HTN. Most recent LVEF 65-70% on 05/17/21.  Patinet presents today for follow up with son. Biggest complaint is dental pain. Scheduled for tooth extraction on Monday. Currently on amoxicillin and ibuprofen. Prescribed ibuprofen 800mg  every 8 hours, however she has been taking 800 mg every 3-4 hours.  Has begun using the nicotine patches prescribed at last visit. Currently wearing one. Has continued to smoke but has noticed a decrease in amount of cigarettes per day.   Lasix was decreased at last visit to 4x weekly. She has since decreased to PRN. Has no edema. Is now complaining of frequent urination. PCP started her on Myrbetriq.   Remains resistant to increasing Entresto.   Continues to report SOB with exertion. New complaint is occasional chest pain at night which is relieved by adjusting her pillows. Does not believe she has indigestion.   Is not checking BP at home.  Current CHF meds:  Carvedilol 12.5mg  BID Farxiga 10mg  daily Entresto 24-26mg  BID Furosemide 20mg  PRN  Current HTN Meds: Amlodipine 10mg  daily Clonidine 0.2mg  BID  BP goal: <130/80   Wt Readings from Last 3 Encounters:  04/19/22 243 lb 12.8 oz (110.6 kg)  11/16/21 229 lb (103.9 kg)  11/01/21 224 lb (101.6 kg)   BP Readings from Last 3 Encounters:  04/19/22 (!) 148/93  11/16/21 118/80  11/01/21 (!) 154/102   Pulse Readings from Last 3 Encounters:  04/19/22 69  11/16/21 73  11/01/21 64    Renal function: CrCl cannot be calculated (Patient's most recent lab result is older than the maximum 21 days allowed.).  Past Medical History:  Diagnosis Date   Anxiety    Bipolar disorder (HCC)    Chronic back pain    DDD (degenerative disc disease),  lumbar    Depression    Hypertension    Pre-diabetes    PTSD (post-traumatic stress disorder)     Current Outpatient Medications on File Prior to Visit  Medication Sig Dispense Refill   atorvastatin (LIPITOR) 10 MG tablet Take 10 mg by mouth daily.     albuterol (VENTOLIN HFA) 108 (90 Base) MCG/ACT inhaler Inhale 2 puffs into the lungs every 6 (six) hours as needed for wheezing or shortness of breath. 8 g 3   aspirin EC 81 MG tablet Take 81 mg by mouth daily.     buPROPion (WELLBUTRIN) 100 MG tablet Take 100 mg by mouth daily.     carvedilol (COREG) 12.5 MG tablet TAKE ONE (1) TABLET BY MOUTH TWICE DAILY 60 tablet 11   cholecalciferol (VITAMIN D3) 25 MCG (1000 UNIT) tablet Take 2,000 Units by mouth daily.     Diclofenac Sodium 3 % GEL Apply 1 application topically daily as needed for pain.     ENTRESTO 24-26 MG TAKE ONE (1) TABLET BY MOUTH TWICE DAILY 60 tablet 10   FARXIGA 10 MG TABS tablet TAKE 1 TABLET BY MOUTH EVERY DAY 30 tablet 10   ipratropium-albuterol (DUONEB) 0.5-2.5 (3) MG/3ML SOLN Take 3 mLs by nebulization 3 (three) times daily. 360 mL 2   OLANZapine (ZYPREXA)  10 MG tablet Take 10 mg by mouth at bedtime.     oxybutynin (DITROPAN) 5 MG tablet Take 5 mg by mouth 3 (three) times daily.     QUEtiapine (SEROQUEL) 400 MG tablet Take 400 mg by mouth at bedtime.   1   topiramate (TOPAMAX) 200 MG tablet Take 200 mg by mouth daily.      traZODone (DESYREL) 100 MG tablet Take 200 mg by mouth at bedtime.      No current facility-administered medications on file prior to visit.    Allergies  Allergen Reactions   Adhesive [Tape] Rash     Assessment/Plan:  CHF - Patient BP in room improved at 139/88 but still above goal of <130/80. Dental pain may be playing a role as well.  Occasional chest pain is a new complaint, unclear if cardiac in origin. Since patient resistant to increases in Entresto due to renal function, will start low dose Bidil. Warned of possible side effects of  headache. Will set up patient for appointment with Dr Wyline Mood next week for assessment.   Continue:  Carvedilol 12.5mg  BID Farxiga 10mg  daily Entresto 24-26mg  BID Furosemide 20mg  PRN Amlodipine 10mg  daily Clonidine 0.2mg  BID Start Bidil 20-37.5mg  TID Follow up with Dr Wyline Mood next week  Laural Golden, PharmD, BCACP, CDCES, CPP [address], [address] [place], [place], [address] Phone: [phone], Fax: [phone]

## 2022-05-31 NOTE — Patient Instructions (Addendum)
It was good seeing you again  We would like your blood pressure less than 130/80  I am going to start a new medication today called hydralazine/isosobide three times a day. The name brand is Bidil.  Please continue your other medications  After your dental extraction, try to remember to check your blood pressure at home  Please let us know if you have any questions  Laural Golden, PharmD, BCACP, CDCES, CPP [address] [place], [place], [address] Phone: [phone], Fax: [phone]

## 2022-06-04 ENCOUNTER — Encounter: Payer: Self-pay | Admitting: Internal Medicine

## 2022-06-04 ENCOUNTER — Ambulatory Visit: Payer: 59 | Attending: Internal Medicine | Admitting: Internal Medicine

## 2022-06-04 VITALS — BP 122/82 | HR 70 | Ht 64.0 in | Wt 240.8 lb

## 2022-06-04 DIAGNOSIS — I10 Essential (primary) hypertension: Secondary | ICD-10-CM

## 2022-06-04 DIAGNOSIS — I5032 Chronic diastolic (congestive) heart failure: Secondary | ICD-10-CM

## 2022-06-04 DIAGNOSIS — Z720 Tobacco use: Secondary | ICD-10-CM
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
## 2022-06-04 NOTE — Patient Instructions (Signed)
Medication Instructions:  The current medical regimen is effective;  continue present plan and medications.  *If you need a refill on your cardiac medications before your next appointment, please call your pharmacy*   Follow-Up: At [facility], you and your health needs are our priority.  As part of our continuing mission to provide you with exceptional heart care, we have created designated Provider Care Teams.  These Care Teams include your primary Cardiologist (physician) and Advanced Practice Providers (APPs -  Physician Assistants and Nurse Practitioners) who all work together to provide you with the care you need, when you need it.  We recommend signing up for the patient portal called "MyChart".  Sign up information is provided on this After Visit Summary.  MyChart is used to connect with patients for Virtual Visits (Telemedicine).  Patients are able to view lab/test results, encounter notes, upcoming appointments, etc.  Non-urgent messages can be sent to your provider as well.   To learn more about what you can do with MyChart, go to [email or web address].    Your next appointment:   6 month(s)  Provider:   Maisie Fus, MD

## 2022-06-13 ENCOUNTER — Other Ambulatory Visit: Payer: Self-pay | Admitting: Family

## 2022-06-13 DIAGNOSIS — I509 Heart failure, unspecified: Secondary | ICD-10-CM

## 2022-06-13 NOTE — Telephone Encounter (Signed)
Patient of Dr. Carolan Clines. Please review for refill. Thank you!

## 2022-06-24 DIAGNOSIS — M533 Sacrococcygeal disorders, not elsewhere classified: Secondary | ICD-10-CM | POA: Insufficient documentation

## 2022-07-03 ENCOUNTER — Other Ambulatory Visit: Payer: Self-pay | Admitting: Family

## 2022-07-08 ENCOUNTER — Other Ambulatory Visit: Payer: Self-pay | Admitting: Internal Medicine

## 2022-07-08 DIAGNOSIS — I5031 Acute diastolic (congestive) heart failure: Secondary | ICD-10-CM

## 2022-08-13 ENCOUNTER — Other Ambulatory Visit (HOSPITAL_COMMUNITY): Payer: Self-pay | Admitting: Nurse Practitioner

## 2022-08-13 DIAGNOSIS — F40232 Fear of other medical care: Secondary | ICD-10-CM | POA: Insufficient documentation

## 2022-08-13 DIAGNOSIS — F1290 Cannabis use, unspecified, uncomplicated: Secondary | ICD-10-CM

## 2022-08-13 DIAGNOSIS — F17219 Nicotine dependence, cigarettes, with unspecified nicotine-induced disorders: Secondary | ICD-10-CM

## 2022-08-29 ENCOUNTER — Other Ambulatory Visit: Payer: Self-pay | Admitting: Internal Medicine

## 2022-08-29 DIAGNOSIS — I5031 Acute diastolic (congestive) heart failure: Secondary | ICD-10-CM

## 2022-08-29 DIAGNOSIS — M79672 Pain in left foot: Secondary | ICD-10-CM | POA: Insufficient documentation

## 2022-09-04 ENCOUNTER — Other Ambulatory Visit: Payer: Self-pay | Admitting: Family

## 2022-09-10 DIAGNOSIS — S9032XA Contusion of left foot, initial encounter: Secondary | ICD-10-CM | POA: Insufficient documentation

## 2022-09-16 ENCOUNTER — Ambulatory Visit (HOSPITAL_COMMUNITY)
Admission: RE | Admit: 2022-09-16 | Discharge: 2022-09-16 | Disposition: A | Discharge status: Home / Self Care | Payer: 59 | Source: Ambulatory Visit | Attending: Nurse Practitioner | Admitting: Nurse Practitioner

## 2022-09-16 DIAGNOSIS — F1290 Cannabis use, unspecified, uncomplicated: Secondary | ICD-10-CM | POA: Insufficient documentation

## 2022-09-16 DIAGNOSIS — F17219 Nicotine dependence, cigarettes, with unspecified nicotine-induced disorders: Secondary | ICD-10-CM | POA: Diagnosis present

## 2022-10-22 ENCOUNTER — Other Ambulatory Visit (HOSPITAL_COMMUNITY): Payer: Self-pay | Admitting: Nurse Practitioner

## 2022-10-22 DIAGNOSIS — Z1231 Encounter for screening mammogram for malignant neoplasm of breast: Secondary | ICD-10-CM

## 2022-11-15 ENCOUNTER — Telehealth (HOSPITAL_COMMUNITY): Payer: Self-pay | Admitting: Vascular Surgery

## 2022-11-15 NOTE — Telephone Encounter (Signed)
Spoke to referring office pt is no appropriate to be seen in [facility], spoke to [place] @ [facility]

## 2022-12-17 ENCOUNTER — Encounter: Payer: Self-pay | Admitting: Internal Medicine

## 2022-12-17 ENCOUNTER — Ambulatory Visit: Payer: 59 | Attending: Internal Medicine | Admitting: Internal Medicine

## 2022-12-17 VITALS — BP 124/62 | HR 97 | Ht 64.5 in | Wt 228.4 lb

## 2022-12-17 DIAGNOSIS — I10 Essential (primary) hypertension: Secondary | ICD-10-CM | POA: Diagnosis not present
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
## 2022-12-17 NOTE — Patient Instructions (Signed)
Medication Instructions:  No change  *If you need a refill on your cardiac medications before your next appointment, please call your pharmacy*   Lab Work: BMP  If you have labs (blood work) drawn today and your tests are completely normal, you will receive your results only by: MyChart Message (if you have MyChart) OR A paper copy in the mail If you have any lab test that is abnormal or we need to change your treatment, we will call you to review the results.   Testing/Procedures: NONE    Follow-Up: At [facility], you and your health needs are our priority.  As part of our continuing mission to provide you with exceptional heart care, we have created designated Provider Care Teams.  These Care Teams include your primary Cardiologist (physician) and Advanced Practice Providers (APPs -  Physician Assistants and Nurse Practitioners) who all work together to provide you with the care you need, when you need it.  Your next appointment:   6 month(s)  Provider:   Maisie Fus, MD

## 2022-12-17 NOTE — Addendum Note (Signed)
Addended byCarolan Clines on: 12/17/2022 04:41 PM   Modules accepted: Level of Service

## 2022-12-18 ENCOUNTER — Encounter: Payer: Self-pay | Admitting: Internal Medicine

## 2022-12-18 ENCOUNTER — Telehealth: Payer: Self-pay | Admitting: Cardiology

## 2022-12-18 LAB — BASIC METABOLIC PANEL
BUN/Creatinine Ratio: 9 (ref 9–23)
BUN: 14 mg/dL (ref 6–24)
CO2: 19 mmol/L — ABNORMAL LOW (ref 20–29)
Calcium: 9.2 mg/dL (ref 8.7–10.2)
Chloride: 108 mmol/L — ABNORMAL HIGH (ref 96–106)
Creatinine, Ser: 1.51 mg/dL — ABNORMAL HIGH (ref 0.57–1.00)
Glucose: 105 mg/dL — ABNORMAL HIGH (ref 70–99)
Potassium: 3.9 mmol/L (ref 3.5–5.2)
Sodium: 141 mmol/L (ref 134–144)
eGFR: 40 mL/min/{1.73_m2} — ABNORMAL LOW (ref >59)

## 2022-12-18 NOTE — Telephone Encounter (Signed)
Left vm on son's phone [phone]) to schedule pt with new provider (Dr. Servando Salina) from Dr. Wyline Mood. Received permission from both providers to make change.  JB, 12-18-22

## 2023-03-05 ENCOUNTER — Other Ambulatory Visit: Payer: Self-pay | Admitting: Student

## 2023-03-05 DIAGNOSIS — K118 Other diseases of salivary glands: Secondary | ICD-10-CM

## 2023-03-11 ENCOUNTER — Ambulatory Visit
Admission: RE | Admit: 2023-03-11 | Discharge: 2023-03-11 | Disposition: A | Discharge status: Home / Self Care | Payer: 59 | Source: Ambulatory Visit | Attending: Student | Admitting: Student

## 2023-03-11 DIAGNOSIS — K118 Other diseases of salivary glands: Secondary | ICD-10-CM

## 2023-04-16 ENCOUNTER — Ambulatory Visit (HOSPITAL_COMMUNITY)
Admission: RE | Admit: 2023-04-16 | Discharge: 2023-04-16 | Disposition: A | Discharge status: Home / Self Care | Payer: 59 | Source: Ambulatory Visit | Attending: Nurse Practitioner | Admitting: Nurse Practitioner

## 2023-04-16 DIAGNOSIS — Z1231 Encounter for screening mammogram for malignant neoplasm of breast: Secondary | ICD-10-CM | POA: Diagnosis present

## 2023-04-22 DIAGNOSIS — R221 Localized swelling, mass and lump, neck: Secondary | ICD-10-CM | POA: Insufficient documentation

## 2023-04-22 DIAGNOSIS — K118 Other diseases of salivary glands: Secondary | ICD-10-CM | POA: Insufficient documentation

## 2023-04-22 DIAGNOSIS — L720 Epidermal cyst: Secondary | ICD-10-CM | POA: Insufficient documentation

## 2023-05-09 ENCOUNTER — Other Ambulatory Visit: Payer: Self-pay | Admitting: Internal Medicine

## 2023-05-09 DIAGNOSIS — I5031 Acute diastolic (congestive) heart failure: Secondary | ICD-10-CM

## 2023-05-13 ENCOUNTER — Ambulatory Visit: Attending: Emergency Medicine | Admitting: Emergency Medicine

## 2023-05-13 ENCOUNTER — Encounter: Payer: Self-pay | Admitting: Emergency Medicine

## 2023-05-13 VITALS — BP 132/78 | HR 98 | Ht 64.5 in | Wt 192.0 lb

## 2023-05-13 DIAGNOSIS — N1832 Chronic kidney disease, stage 3b: Secondary | ICD-10-CM

## 2023-05-13 DIAGNOSIS — E782 Mixed hyperlipidemia: Secondary | ICD-10-CM | POA: Diagnosis not present

## 2023-05-13 DIAGNOSIS — I5032 Chronic diastolic (congestive) heart failure: Secondary | ICD-10-CM | POA: Diagnosis not present

## 2023-05-13 DIAGNOSIS — I10 Essential (primary) hypertension: Secondary | ICD-10-CM

## 2023-05-13 DIAGNOSIS — Z720 Tobacco use: Secondary | ICD-10-CM

## 2023-05-13 DIAGNOSIS — I739 Peripheral vascular disease, unspecified: Secondary | ICD-10-CM | POA: Diagnosis not present

## 2023-05-13 LAB — BASIC METABOLIC PANEL
BUN/Creatinine Ratio: 9 (ref 9–23)
BUN: 15 mg/dL (ref 6–24)
CO2: 22 mmol/L (ref 20–29)
Calcium: 9.6 mg/dL (ref 8.7–10.2)
Chloride: 104 mmol/L (ref 96–106)
Creatinine, Ser: 1.69 mg/dL — ABNORMAL HIGH (ref 0.57–1.00)
Glucose: 108 mg/dL — ABNORMAL HIGH (ref 70–99)
Potassium: 3.9 mmol/L (ref 3.5–5.2)
Sodium: 142 mmol/L (ref 134–144)
eGFR: 35 mL/min/{1.73_m2} — ABNORMAL LOW (ref >59)

## 2023-05-13 NOTE — Progress Notes (Signed)
 Cardiology Office Note:    Date:  05/13/2023  ID:  [name], DOB 1965/04/11, MRN [id number] PCP: Karenann Cai, NP  [facility] Providers Cardiologist:  Thomasene Ripple, DO       Patient Profile:      Chief Complaint: 81-month follow-up for HFpEF and hypertension History of Present Illness:  [name] is a 58 y.o. female with visit-pertinent history of HFpEF, grade 2 DD, severe LVH, poorly controlled hypertension, hyperlipidemia, tobacco abuse  Admitted 3/30-05/19/21 after presenting with shortness of breath admitted for hypertensive urgency. Elevated creatinine 1.69, BNP 356, and CXR with pulmonary edema. Troponin and PE/DVT workup negative. She noted increased LE edema x 2 months. Treated with prednisone, IV lasix. Echo during admission with HFpEF LVEF 65-70%, gr2DD, severe LVH. She was referred for sleep study, advanced hypertension clinic. She was discharged on amlodipine 10 mg daily, clonidine 0.2 mg twice daily, furosemide 40 mg daily, labetalol 400 mg 3 times daily.   She established with cardiology's advance hypertension clinic on 07/2021.  She was started on carvedilol 12.5 mg twice daily.  Her clonidine was reduced to 0.2 mg twice daily.  Seen by Pharm.D. on 08/2021.  She was started on Entresto 24-26 mg twice daily and Farxiga 10 mg daily.  Seen by Pharm.D. on 05/31/2022.  She was started on BiDil 20-37.5 mg 3 times daily.  She was continued on carvedilol total of 12.5 mg twice daily, Farxiga 10 mg daily, Entresto 24-26 mg twice daily, furosemide 20 mg as needed, amlodipine 10 mg daily, clonidine 0.2 mg twice daily.   Last seen in clinic on 12/17/2022 by Dr. Wyline Mood.  She was doing well without cardiovascular concerns or complaints.  Her blood pressure was well-controlled at 124/62 for medication management was continued.   Discussed the use of AI scribe software for clinical note transcription with the patient, who gave verbal consent to proceed.  Today patient  presents by herself for a routine check-up.  Today she notes that she is doing well.  She notes she has had some medication adherence issues since last office visit.  She has occasionally missed some days of her medication and most recently missed the previous 2 days prior to today's visit.  She did take her medicine this morning.  Patient notes that she still sleeps on 3 pillows nightly and denies any shortness of breath, PND, dyspnea on exertion.  She does however note symptoms of claudication with pain when she walks that is relieved by rest.  She notes the pain is in her bilateral calves has been ongoing for at least over a year.  She is without any chest pains, lower extremity swelling, palpitations, syncope, presyncope.  The patient has been on a weight loss journey and has lost 36 pounds by dieting.  She recently started taking Ozempic, which has helped with her weight loss. However, she reports not eating for a couple of days at a time due to a lack of hunger, which has led to episodes of lightheadedness.  The patient is a smoker, consuming about half a pack of cigarettes a day, and also uses marijuana, smoking about three to four joints a day. She expresses a desire to reduce her medication load.  Her medication does come prepackaged.    Review of systems:  Please see the history of present illness. All other systems are reviewed and otherwise negative.     Home Medications:    Current Meds  Medication Sig   albuterol (VENTOLIN HFA) 108 (  90 Base) MCG/ACT inhaler Inhale 2 puffs into the lungs every 6 (six) hours as needed for wheezing or shortness of breath.   amLODipine (NORVASC) 10 MG tablet Take 1 tablet (10 mg total) by mouth daily.   amoxicillin (AMOXIL) 500 MG capsule Take 500 mg by mouth 3 (three) times daily.   aspirin EC 81 MG tablet Take 81 mg by mouth daily.   atorvastatin (LIPITOR) 10 MG tablet Take 10 mg by mouth daily.   buPROPion (WELLBUTRIN) 100 MG tablet Take 100 mg by  mouth daily.   carvedilol (COREG) 12.5 MG tablet Take 1 tablet by mouth twice daily   cholecalciferol (VITAMIN D3) 25 MCG (1000 UNIT) tablet Take 2,000 Units by mouth daily.   cloNIDine (CATAPRES) 0.2 MG tablet Take 1 tablet (0.2 mg total) by mouth 2 (two) times daily.   Diclofenac Sodium 3 % GEL Apply 1 application topically daily as needed for pain.   ENTRESTO 24-26 MG Take 1 tablet by mouth twice daily   FARXIGA 10 MG TABS tablet TAKE 1 TABLET BY MOUTH EVERY DAY   furosemide (LASIX) 20 MG tablet Take 1 tablet by mouth daily.   gabapentin (NEURONTIN) 300 MG capsule Take 300 mg by mouth 3 (three) times daily.   ibuprofen (ADVIL) 800 MG tablet Take 800 mg by mouth 3 (three) times daily.   ipratropium-albuterol (DUONEB) 0.5-2.5 (3) MG/3ML SOLN Take 3 mLs by nebulization 3 (three) times daily.   isosorbide-hydrALAZINE (BIDIL) 20-37.5 MG tablet Take 1 tablet by mouth 3 times a day   MYRBETRIQ 25 MG TB24 tablet Take 25 mg by mouth daily.   nicotine (NICOTINE STEP 1) 21 mg/24hr patch Place 1 patch (21 mg total) onto the skin daily.   OLANZapine (ZYPREXA) 10 MG tablet Take 10 mg by mouth at bedtime.   oxybutynin (DITROPAN) 5 MG tablet Take 5 mg by mouth 3 (three) times daily.   OZEMPIC, 0.25 OR 0.5 MG/DOSE, 2 MG/3ML SOPN 1 mL once a week.   QUEtiapine (SEROQUEL) 400 MG tablet Take 400 mg by mouth at bedtime.    topiramate (TOPAMAX) 200 MG tablet Take 200 mg by mouth daily.    traZODone (DESYREL) 100 MG tablet Take 200 mg by mouth at bedtime.    Studies Reviewed:   EKG Interpretation Date/Time:  Tuesday May 13 2023 08:57:43 EDT Ventricular Rate:  98 PR Interval:  158 QRS Duration:  82 QT Interval:  372 QTC Calculation: 474 R Axis:   -35  Text Interpretation: Normal sinus rhythm Possible Left atrial enlargement Left axis deviation Left ventricular hypertrophy ( R in aVL , Cornell product ) When compared with ECG of 17-Dec-2022 08:41, No significant change was found Confirmed by Rise Paganini [phone]) on 05/13/2023 4:00:14 PM    Echocardiogram 04/20/2021 1. Left ventricular ejection fraction, by estimation, is 65 to 70%. The  left ventricle has normal function. The left ventricle has no regional  wall motion abnormalities. The left ventricular internal cavity size was  mildly dilated. There is severe  concentric left ventricular hypertrophy. Left ventricular diastolic  parameters are consistent with Grade II diastolic dysfunction  (pseudonormalization). Elevated left ventricular end-diastolic pressure.   2. Right ventricular systolic function is normal. The right ventricular  size is normal. There is normal pulmonary artery systolic pressure. The  estimated right ventricular systolic pressure is 26.8 mmHg.   3. Left atrial size was moderately dilated.   4. A small pericardial effusion is present. The pericardial effusion is  anterior to the right  ventricle. There is no evidence of cardiac  tamponade.   5. The mitral valve is normal in structure. Trivial mitral valve  regurgitation. No evidence of mitral stenosis.   6. The aortic valve is normal in structure. Aortic valve regurgitation is  not visualized. No aortic stenosis is present.   7. The inferior vena cava is normal in size with greater than 50%  respiratory variability, suggesting right atrial pressure of 3 mmHg.  Risk Assessment/Calculations:         Physical Exam:   VS:  BP 132/78 (BP Location: Left Arm, Patient Position: Sitting, Cuff Size: Normal)   Pulse 98   Ht 5' 4.5" (1.638 m)   Wt 192 lb (87.1 kg)   BMI 32.45 kg/m    Wt Readings from Last 3 Encounters:  05/13/23 192 lb (87.1 kg)  12/17/22 228 lb 6.4 oz (103.6 kg)  06/04/22 240 lb 12.8 oz (109.2 kg)    GEN: Well nourished, well developed in no acute distress NECK: No JVD; No carotid bruits CARDIAC: RRR, no murmurs, rubs, gallops RESPIRATORY:  Clear to auscultation without rales, wheezing or rhonchi  ABDOMEN: Soft, non-tender,  non-distended EXTREMITIES:  No edema; No acute deformity     Assessment and Plan:  HFpEF Echocardiogram 04/2021 showed LVEF 65-70%, no RWMA, severe LVH, grade 2 diastolic dysfunction - In the setting of CKD stage IIIb and obesity.  Continues to sleep on 3 pillows however is without overt heart failure symptoms today - She is euvolemic and well compensated on exam.  NYHA class I - Recently started Ozempic and has been losing weight.  Previous OV 228 lbs now 192 lbs. - Overall she is doing well without evidence of exacerbation we will continue current medication regimen - Continue current GDMT Carvedilol 12.5 mg twice daily, Farxiga 10 mg daily, Entresto 24-26 mg twice daily, Furosemide 20 mg daily - BMET today  Hypertension Blood pressure today 132/78 and very mildly above goal of <130/80 She notes that she did miss the last 2 days of her medication and does not take her blood pressure at home - I will not make any medication changes today as she was borderline elevated above goal and has not had complete medication adherence  - Encouraged complete adherence to the prescribed medication regimen - Continue Amlodipine 10 mg daily, Carvedilol 12.5 mg twice daily, Clonidine 0.2 mg twice daily, Entresto 24-26 mg twice daily, Furosemide 20 mg daily, BiDil 20-37.5 mg twice daily - She uses pill packs  Hyperlipidemia No recent lipid panel on file.  Currently managed by her PCP -Continue Atorvastatin 10 mg daily  Leg pain She notes symptoms of claudication that has been ongoing for over a year.  She describes bilateral calf pain that is worse when she walks but is relieved by rest - Ordered lower extremity arterial duplex today to further evaluate  Tobacco abuse and cannabis use Currently smoking half a pack of cigarettes daily and 3-4 joints daily - Encouraged complete tobacco/cannabis cessation  CKD stage IIIb - GFR 40 on 11/2019 - BMET today            Dispo:  Return in about 4 months  (around 09/12/2023).  Signed, Denyce Robert, NP

## 2023-05-13 NOTE — Patient Instructions (Addendum)
 Medication Instructions:  NO CHANGES CONTINUE WITH YOUR CURRENT MEDICATION THERAPY. TAKE BLOOD PRESSURE DAILY AND GIVE [place] A CALL IF YOUR BLOOD PRESSURE STAYS CONSISTENTLY OVER 130/80.    Lab Work: BMET     Testing/Procedures: Your physician has requested that you have a lower extremity arterial duplex. This test is an ultrasound of the arteries in the legs or arms. It looks at arterial blood flow in the legs and arms. Allow one hour for Lower and Upper Arterial scans. There are no restrictions or special instructions.  Please note: We ask at that you not bring children with you during ultrasound (echo/ vascular) testing. Due to room size and safety concerns, children are not allowed in the ultrasound rooms during exams. Our front office staff cannot provide observation of children in our lobby area while testing is being conducted. An adult accompanying a patient to their appointment will only be allowed in the ultrasound room at the discretion of the ultrasound technician under special circumstances. We apologize for any inconvenience.    Follow-Up: At [facility], you and your health needs are our priority.  As part of our continuing mission to provide you with exceptional heart care, we have created designated Provider Care Teams.  These Care Teams include your primary Cardiologist (physician) and Advanced Practice Providers (APPs -  Physician Assistants and Nurse Practitioners) who all work together to provide you with the care you need, when you need it.    Your next appointment:   3-4 month(s)  Provider:   DR. Servando Salina AS A NEW PATIENT.   Other Instructions:

## 2023-05-23 DIAGNOSIS — Z9289 Personal history of other medical treatment: Secondary | ICD-10-CM | POA: Insufficient documentation

## 2023-05-23 DIAGNOSIS — E119 Type 2 diabetes mellitus without complications: Secondary | ICD-10-CM | POA: Insufficient documentation

## 2023-05-23 DIAGNOSIS — C8290 Follicular lymphoma, unspecified, unspecified site: Secondary | ICD-10-CM | POA: Insufficient documentation

## 2023-05-27 ENCOUNTER — Other Ambulatory Visit (HOSPITAL_COMMUNITY): Payer: Self-pay | Admitting: Emergency Medicine

## 2023-05-27 DIAGNOSIS — I739 Peripheral vascular disease, unspecified: Secondary | ICD-10-CM

## 2023-05-28 ENCOUNTER — Ambulatory Visit (HOSPITAL_COMMUNITY)
Admission: RE | Admit: 2023-05-28 | Discharge: 2023-05-28 | Disposition: A | Discharge status: Home / Self Care | Source: Ambulatory Visit | Attending: Emergency Medicine | Admitting: Emergency Medicine

## 2023-05-28 ENCOUNTER — Ambulatory Visit (HOSPITAL_COMMUNITY)
Admission: RE | Admit: 2023-05-28 | Discharge: 2023-05-28 | Disposition: A | Discharge status: Home / Self Care | Source: Ambulatory Visit | Attending: Cardiovascular Disease | Admitting: Cardiovascular Disease

## 2023-05-28 DIAGNOSIS — I739 Peripheral vascular disease, unspecified: Secondary | ICD-10-CM

## 2023-05-29 LAB — VAS US ABI WITH/WO TBI
Left ABI: 0.7
Right ABI: 1.04

## 2023-06-24 ENCOUNTER — Encounter: Payer: Self-pay | Admitting: Cardiovascular Disease

## 2023-06-24 ENCOUNTER — Ambulatory Visit: Attending: Cardiovascular Disease | Admitting: Cardiovascular Disease

## 2023-06-24 VITALS — BP 122/98 | HR 93 | Ht 64.5 in | Wt 184.2 lb

## 2023-06-24 DIAGNOSIS — I739 Peripheral vascular disease, unspecified: Secondary | ICD-10-CM | POA: Insufficient documentation

## 2023-06-24 MED ORDER — CILOSTAZOL 50 MG PO TABS: 50.0000 mg | ORAL_TABLET | Freq: Two times a day (BID) | ORAL | 1 refills | Status: AC

## 2023-06-24 MED ORDER — CILOSTAZOL 50 MG PO TABS: 50.0000 mg | ORAL_TABLET | Freq: Two times a day (BID) | ORAL | 1 refills | Status: DC

## 2023-06-24 NOTE — Assessment & Plan Note (Signed)
 [name] was referred to me by Palmer Bobo NP for evaluation of PAD.  She has risk factors including tobacco abuse, treated hypertension, diabetes and hyperlipidemia.  She has had left calf claudication for 6 months which is lifestyle limiting.  She did have Doppler studies performed in our office/9/25 revealing right ABI of 1.04 and a left of 0.70.  She had an occluded right SFA, popliteal and tibial vessels.  She does have moderate renal insufficiency with a serum creatinine of 1.7 and diabetic.  We talked about pharmacologic therapy first before discussing the possibility of endovascular treatment.  I am going to begin her on Pletal 50 mg p.o. twice daily I will see her back in 3 months.
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
## 2023-06-24 NOTE — Patient Instructions (Signed)
 Medication Instructions:  Your physician has recommended you make the following change in your medication:   -Start cilostazol (pletal) 50mg  twice daily.  *If you need a refill on your cardiac medications before your next appointment, please call your pharmacy*  Follow-Up: At [facility], you and your health needs are our priority.  As part of our continuing mission to provide you with exceptional heart care, our providers are all part of one team.  This team includes your primary Cardiologist (physician) and Advanced Practice Providers or APPs (Physician Assistants and Nurse Practitioners) who all work together to provide you with the care you need, when you need it.  Your next appointment:   3 month(s)  Provider:   Lauro Portal, MD    We recommend signing up for the patient portal called "MyChart".  Sign up information is provided on this After Visit Summary.  MyChart is used to connect with patients for Virtual Visits (Telemedicine).  Patients are able to view lab/test results, encounter notes, upcoming appointments, etc.  Non-urgent messages can be sent to your provider as well.   To learn more about what you can do with MyChart, go to [email or web address].

## 2023-07-09 ENCOUNTER — Telehealth: Payer: Self-pay

## 2023-07-09 ENCOUNTER — Other Ambulatory Visit: Payer: Self-pay

## 2023-07-09 DIAGNOSIS — Z1211 Encounter for screening for malignant neoplasm of colon: Secondary | ICD-10-CM

## 2023-07-09 MED ORDER — NA SULFATE-K SULFATE-MG SULF 17.5-3.13-1.6 GM/177ML PO SOLN: 1.0000 | Freq: Once | ORAL | 0 refills | Status: AC

## 2023-07-09 NOTE — Telephone Encounter (Signed)
 Gastroenterology Pre-Procedure Review  Request Date: 08/26/23 Requesting Physician: Dr. Ole Berkeley  PATIENT REVIEW QUESTIONS: The patient responded to the following health history questions as indicated:    1. Are you having any GI issues? no 2. Do you have a personal history of Polyps? no 3. Do you have a family history of Colon Cancer or Polyps? no 4. Diabetes Mellitus? yes (Takes Farxiga -has been advised to stop 3 days prior, also takes ozempic has been advised to stop 7 days prior to colonoscopy noted on instructions and noted for patient to contact her pcp if she has any problems related to her blood sugars as she is preparing for her colonoscopy) 5. Joint replacements in the past 12 months?no 6. Major health problems in the past 3 months?no 7. Any artificial heart valves, MVP, or defibrillator?no 8. Cardiac issues? Yes clearance sent to [facility] care. Pt has acute heart failure     MEDICATIONS & ALLERGIES:    Patient reports the following regarding taking any anticoagulation/antiplatelet therapy:   Plavix, Coumadin, Eliquis, Xarelto, Lovenox , Pradaxa, Brilinta, or Effient? no Aspirin ? no  Patient confirms/reports the following medications:  Current Outpatient Medications  Medication Sig Dispense Refill   albuterol  (VENTOLIN  HFA) 108 (90 Base) MCG/ACT inhaler Inhale into the lungs.     Na Sulfate-K Sulfate-Mg Sulfate concentrate (SUPREP) 17.5-3.13-1.6 GM/177ML SOLN Take 1 kit (354 mLs total) by mouth once for 1 dose. 354 mL 0   ondansetron  (ZOFRAN ) 4 MG tablet Take 4 mg by mouth every 8 (eight) hours as needed.     amLODipine  (NORVASC ) 10 MG tablet Take 1 tablet (10 mg total) by mouth daily. 90 tablet 1   amoxicillin  (AMOXIL ) 500 MG capsule Take 500 mg by mouth 3 (three) times daily.     aspirin  EC 81 MG tablet Take 81 mg by mouth daily.     atorvastatin (LIPITOR) 10 MG tablet Take 10 mg by mouth daily. (Patient not taking: Reported on 06/24/2023)     atorvastatin (LIPITOR) 20 MG tablet Take  20 mg by mouth daily.     buPROPion (WELLBUTRIN) 100 MG tablet Take 100 mg by mouth daily.     carvedilol  (COREG ) 12.5 MG tablet Take 1 tablet by mouth twice daily 60 tablet 10   cholecalciferol  (VITAMIN D3) 25 MCG (1000 UNIT) tablet Take 2,000 Units by mouth daily.     cilostazol  (PLETAL ) 50 MG tablet Take 1 tablet (50 mg total) by mouth 2 (two) times daily. 180 tablet 1   cloNIDine  (CATAPRES ) 0.2 MG tablet Take 1 tablet (0.2 mg total) by mouth 2 (two) times daily. 180 tablet 3   ENTRESTO  24-26 MG Take 1 tablet by mouth twice daily 60 tablet 11   FARXIGA  10 MG TABS tablet TAKE 1 TABLET BY MOUTH EVERY DAY 30 tablet 10   furosemide  (LASIX ) 20 MG tablet Take 1 tablet by mouth daily. 90 tablet 3   gabapentin  (NEURONTIN ) 300 MG capsule Take 300 mg by mouth 3 (three) times daily.     HYDROcodone -acetaminophen  (NORCO/VICODIN) 5-325 MG tablet Take 1 tablet by mouth every 4 (four) hours as needed.     ibuprofen  (ADVIL ) 800 MG tablet Take 800 mg by mouth 3 (three) times daily.     ipratropium-albuterol  (DUONEB) 0.5-2.5 (3) MG/3ML SOLN Take 3 mLs by nebulization 3 (three) times daily. 360 mL 2   isosorbide-hydrALAZINE  (BIDIL) 20-37.5 MG tablet Take 1 tablet by mouth 3 times a day 90 tablet 7   MYRBETRIQ 25 MG TB24 tablet Take 25 mg by  mouth daily.     OLANZapine (ZYPREXA) 10 MG tablet Take 10 mg by mouth at bedtime.     oxybutynin (DITROPAN) 5 MG tablet Take 5 mg by mouth 3 (three) times daily.     OZEMPIC, 1 MG/DOSE, 4 MG/3ML SOPN Inject 1 mg into the skin once a week.     QUEtiapine  (SEROQUEL ) 400 MG tablet Take 400 mg by mouth at bedtime.   1   topiramate  (TOPAMAX ) 200 MG tablet Take 200 mg by mouth daily.      traMADol (ULTRAM) 50 MG tablet 50 mg every 6 (six) hours as needed.     traZODone  (DESYREL ) 100 MG tablet Take 200 mg by mouth at bedtime.      No current facility-administered medications for this visit.    Patient confirms/reports the following allergies:  Allergies  Allergen Reactions    Egg Solids, Whole Nausea Only   Tape Rash and Dermatitis    No orders of the defined types were placed in this encounter.   AUTHORIZATION INFORMATION Primary Insurance: 1D#: Group #:  Secondary Insurance: 1D#: Group #:  SCHEDULE INFORMATION: Date: 08/26/23 Time: Location: [facility]

## 2023-07-10 ENCOUNTER — Telehealth: Payer: Self-pay

## 2023-07-10 ENCOUNTER — Telehealth: Payer: Self-pay | Admitting: *Deleted

## 2023-07-10 ENCOUNTER — Telehealth: Payer: Self-pay | Admitting: Cardiovascular Disease

## 2023-07-10 NOTE — Telephone Encounter (Signed)
   Pre-operative Risk Assessment    Patient Name: [name]  DOB: 10-09-1965 MRN: [id number]   Date of last office visit: 06/24/2023 Date of next office visit: 09/04/2023   Request for Surgical Clearance    Procedure:  COLONOSCOPY  Date of Surgery:  Clearance 08/26/23                                Surgeon:  DR. DARREN WOHL Surgeon's Group or Practice Name:  [facility] Phone number:  [phone] Fax number:  [phone]   Type of Clearance Requested:   - Medical  - Pharmacy:  Hold FARXIGA  X'S 3 DAYS   OZEMPIC X'S 7 DAYS   Type of Anesthesia:  General    Additional requests/questions:    [name]   07/10/2023, 7:09 AM

## 2023-07-10 NOTE — Telephone Encounter (Signed)
 Cardiac Clearance sent to [facility] -colonoscopy scheduled 08/26/2023.

## 2023-07-10 NOTE — Telephone Encounter (Signed)
   Pre-operative Risk Assessment    Patient Name: [name]  DOB: 06-Nov-1965 MRN: [id number]   Date of last office visit: 06/24/23 Date of next office visit: n/a   Request for Surgical Clearance    Procedure:  Colonoscopy  Date of Surgery:  Clearance 08/26/23                                Surgeon:  Dr. Marnee Sink Surgeon's Group or Practice Name:  [facility] Phone number:  [phone] Fax number:  [phone]   Type of Clearance Requested:  Ozempic 7days, Farxiga  3days prior    Type of Anesthesia:  General    Additional requests/questions:    SignedFletcher Humble   07/10/2023, 8:13 AM

## 2023-07-10 NOTE — Telephone Encounter (Signed)
 Good Morning [name],  We have received a surgical clearance request for [name]. They were seen recently in clinic on 04/2523. Can you please comment on surgical clearance for upcoming colonoscopy procedure. Please forward you guidance and recommendations to P [facility] PREOP   Thanks, Charles Connor, NP

## 2023-07-15 ENCOUNTER — Telehealth: Payer: Self-pay

## 2023-07-15 NOTE — Telephone Encounter (Signed)
   Patient Name: [name]  DOB: 1965-08-23 MRN: [id number]  Primary Cardiologist: Kardie Tobb, DO  Chart reviewed as part of pre-operative protocol coverage. Given past medical history and time since last visit, based on ACC/AHA guidelines, Brynlyn Dade is at acceptable risk for the planned procedure without further cardiovascular testing.   The patient was advised that if she develops new symptoms prior to surgery to contact our office to arrange for a follow-up visit, and she verbalized understanding.  Patient can hold Farxiga  3 days prior to procedure and guidance for holding Ozempic will need to come from prescribing provider.  I will route this recommendation to the requesting party via Epic fax function and remove from pre-op pool.  Please call with questions.  Francene Ing, Retha Cast, NP 07/15/2023, 7:12 AM

## 2023-07-15 NOTE — Telephone Encounter (Signed)
 Cardiology clearance received from Kardie Tobb, DO acceptable risk for the planned procedure without further cardiovascular testing.  Patient can hold Farxiga  3 days prior to procedure.

## 2023-07-15 NOTE — Telephone Encounter (Signed)
 [name], [facility]    07/15/23  9:44 AM Note Cardiology clearance received from Kardie Tobb, DO acceptable risk for the planned procedure without further cardiovascular testing.   Patient can hold Farxiga  3 days prior to procedure.

## 2023-08-19 ENCOUNTER — Telehealth: Payer: Self-pay

## 2023-08-19 NOTE — Telephone Encounter (Signed)
 Colonoscopy has been rescheduled to 11/06/23 still with Dr. Jinny at [facility].  [name], [facility]    07/15/23  9:44 AM Note Cardiology clearance received from Kardie Tobb, DO acceptable risk for the planned procedure without further cardiovascular testing.   Patient can hold Farxiga  3 days prior to procedure.

## 2023-08-19 NOTE — OR Nursing (Signed)
 Pt needs to r/s appt, does not have transportation for appt. I called the office, will put pt in the depot, told pt to call office to r/s when she has transportation

## 2023-08-19 NOTE — Telephone Encounter (Signed)
 Pt told endo that she needs to r/s upcoming procedure due to lack of transportation. Kristin said that she would put the patient in the depot for you to reschedule

## 2023-09-02 ENCOUNTER — Encounter: Payer: Self-pay | Admitting: *Deleted

## 2023-09-04 ENCOUNTER — Ambulatory Visit: Attending: Cardiology | Admitting: Cardiology

## 2023-09-24 ENCOUNTER — Encounter: Payer: Self-pay | Admitting: Cardiovascular Disease

## 2023-09-24 ENCOUNTER — Ambulatory Visit: Attending: Cardiology | Admitting: Cardiovascular Disease

## 2023-09-24 VITALS — BP 102/78 | HR 81 | Ht 64.5 in | Wt 169.0 lb

## 2023-09-24 DIAGNOSIS — I739 Peripheral vascular disease, unspecified: Secondary | ICD-10-CM | POA: Diagnosis not present

## 2023-09-24 NOTE — Patient Instructions (Signed)
 Medication Instructions:  Your physician recommends that you continue on your current medications as directed. Please refer to the Current Medication list given to you today.  *If you need a refill on your cardiac medications before your next appointment, please call your pharmacy*  Follow-Up: At [facility], you and your health needs are our priority.  As part of our continuing mission to provide you with exceptional heart care, our providers are all part of one team.  This team includes your primary Cardiologist (physician) and Advanced Practice Providers or APPs (Physician Assistants and Nurse Practitioners) who all work together to provide you with the care you need, when you need it.  Your next appointment:   6 month(s)  Provider:   Lauro Portal, MD    We recommend signing up for the patient portal called "MyChart".  Sign up information is provided on this After Visit Summary.  MyChart is used to connect with patients for Virtual Visits (Telemedicine).  Patients are able to view lab/test results, encounter notes, upcoming appointments, etc.  Non-urgent messages can be sent to your provider as well.   To learn more about what you can do with MyChart, go to [email or web address].

## 2023-09-24 NOTE — Progress Notes (Signed)
 [name] returns for follow-up of PAD having been started on Pletal  for left lower extremity claudication when I saw her last on 06/24/2023.   her claudication has completely resolved.  She still has some pain at the ball of her left foot which I did do not think is necessarily ischemically mediated.  She is continuing to smoke.  She still getting chemotherapy for her lymphoma.  Overall, I am encouraged by her response to Pletal .  This point, I do not see a need to pursue an invasive evaluation.  I will see her back in 6 months for follow-up.  Dorn DOROTHA Lesches, M.D., FACP, [facility], FAHA, [facility]  [address] [place], [place]  [address]  [phone] 09/24/2023 9:11 AM

## 2023-10-10 ENCOUNTER — Telehealth: Payer: Self-pay | Admitting: Cardiovascular Disease

## 2023-10-10 NOTE — Telephone Encounter (Signed)
 *  STAT* If patient is at the pharmacy, call can be transferred to refill team.   1. Which medications need to be refilled? (please list name of each medication and dose if known)   cilostazol  (PLETAL ) 50 MG tablet     2. Would you like to learn more about the convenience, safety, & potential cost savings by using the [facility]? No      3. Are you open to using the [facility] (Type [facility] no    4. Which pharmacy/location (including street and city if local pharmacy) is medication to be sent to?[facility] - [place], [place] - [address]    5. Do they need a 30 day or 90 day supply? 90 days

## 2023-10-15 NOTE — Telephone Encounter (Signed)
 Verbal RX sent in

## 2023-10-15 NOTE — Telephone Encounter (Signed)
*  STAT* If patient is at the pharmacy, call can be transferred to refill team.   1. Which medications need to be refilled? (please list name of each medication and dose if known)   cilostazol  (PLETAL ) 50 MG tablet   2. Would you like to learn more about the convenience, safety, & potential cost savings by using the [facility]?   3. Are you open to using the [facility] (Type [facility]. ).  4. Which pharmacy/location (including street and city if local pharmacy) is medication to be sent to?  [facility] - [place], [place] - [address]   5. Do they need a 30 day or 90 day supply?   90 day  Patient stated she still has some medication. Patient stated pharmacy wants a call back to [phone] regarding this prescription.

## 2023-11-06 ENCOUNTER — Encounter: Payer: Self-pay | Admitting: Gastroenterology

## 2023-11-06 ENCOUNTER — Ambulatory Visit

## 2023-11-06 ENCOUNTER — Encounter
Admission: RE | Disposition: A | Discharge status: Home / Self Care | Payer: Self-pay | Source: Home / Self Care | Attending: Gastroenterology

## 2023-11-06 ENCOUNTER — Ambulatory Visit
Admission: RE | Admit: 2023-11-06 | Discharge: 2023-11-06 | Disposition: A | Discharge status: Home / Self Care | Attending: Gastroenterology | Admitting: Gastroenterology

## 2023-11-06 ENCOUNTER — Other Ambulatory Visit: Payer: Self-pay

## 2023-11-06 DIAGNOSIS — D128 Benign neoplasm of rectum: Secondary | ICD-10-CM | POA: Insufficient documentation

## 2023-11-06 DIAGNOSIS — F319 Bipolar disorder, unspecified: Secondary | ICD-10-CM | POA: Diagnosis not present

## 2023-11-06 DIAGNOSIS — D123 Benign neoplasm of transverse colon: Secondary | ICD-10-CM | POA: Insufficient documentation

## 2023-11-06 DIAGNOSIS — Z5982 Transportation insecurity: Secondary | ICD-10-CM | POA: Diagnosis not present

## 2023-11-06 DIAGNOSIS — E119 Type 2 diabetes mellitus without complications: Secondary | ICD-10-CM | POA: Insufficient documentation

## 2023-11-06 DIAGNOSIS — Z7984 Long term (current) use of oral hypoglycemic drugs: Secondary | ICD-10-CM | POA: Diagnosis not present

## 2023-11-06 DIAGNOSIS — I10 Essential (primary) hypertension: Secondary | ICD-10-CM | POA: Insufficient documentation

## 2023-11-06 DIAGNOSIS — F17210 Nicotine dependence, cigarettes, uncomplicated: Secondary | ICD-10-CM | POA: Diagnosis not present

## 2023-11-06 DIAGNOSIS — D120 Benign neoplasm of cecum: Secondary | ICD-10-CM | POA: Insufficient documentation

## 2023-11-06 DIAGNOSIS — Z1211 Encounter for screening for malignant neoplasm of colon: Secondary | ICD-10-CM | POA: Insufficient documentation

## 2023-11-06 DIAGNOSIS — J449 Chronic obstructive pulmonary disease, unspecified: Secondary | ICD-10-CM | POA: Diagnosis not present

## 2023-11-06 DIAGNOSIS — Z634 Disappearance and death of family member: Secondary | ICD-10-CM | POA: Insufficient documentation

## 2023-11-06 DIAGNOSIS — D122 Benign neoplasm of ascending colon: Secondary | ICD-10-CM | POA: Diagnosis not present

## 2023-11-06 DIAGNOSIS — Z7985 Long-term (current) use of injectable non-insulin antidiabetic drugs: Secondary | ICD-10-CM | POA: Diagnosis not present

## 2023-11-06 DIAGNOSIS — Z79899 Other long term (current) drug therapy: Secondary | ICD-10-CM | POA: Diagnosis not present

## 2023-11-06 DIAGNOSIS — K635 Polyp of colon: Secondary | ICD-10-CM

## 2023-11-06 HISTORY — PX: COLONOSCOPY: SHX5424

## 2023-11-06 HISTORY — PX: POLYPECTOMY: SHX149

## 2023-11-06 SURGERY — COLONOSCOPY
Anesthesia: General

## 2023-11-06 MED ORDER — PROPOFOL 10 MG/ML IV BOLUS
INTRAVENOUS | Status: DC | PRN
  Administered 2023-11-06: 100 mg via INTRAVENOUS

## 2023-11-06 MED ORDER — LIDOCAINE HCL (CARDIAC) PF 100 MG/5ML IV SOSY
PREFILLED_SYRINGE | INTRAVENOUS | Status: DC | PRN
  Administered 2023-11-06: 100 mg via INTRAVENOUS

## 2023-11-06 MED ORDER — PROPOFOL 500 MG/50ML IV EMUL
INTRAVENOUS | Status: DC | PRN
  Administered 2023-11-06: 150 ug/kg/min via INTRAVENOUS

## 2023-11-06 MED ORDER — SODIUM CHLORIDE 0.9 % IV SOLN: INTRAVENOUS | Status: DC

## 2023-11-06 NOTE — Op Note (Signed)
 [facility] Gastroenterology Patient Name: [name] Procedure Date: 11/06/2023 10:54 AM MRN: [id number] Account #: [id number] Date of Birth: Jul 11, 1965 Admit Type: Outpatient Age: 58 Room: [facility] ENDO ROOM 4 Gender: Female Note Status: Finalized Instrument Name: Colon Scope [id number] Procedure:             Colonoscopy Indications:           Screening for colorectal malignant neoplasm Providers:             Rogelia Copping MD, MD Referring MD:          Harlene Axe (Referring MD) Medicines:             Propofol  per Anesthesia Complications:         No immediate complications. Procedure:             Pre-Anesthesia Assessment:                        - Prior to the procedure, a History and Physical was                         performed, and patient medications and allergies were                         reviewed. The patient's tolerance of previous                         anesthesia was also reviewed. The risks and benefits                         of the procedure and the sedation options and risks                         were discussed with the patient. All questions were                         answered, and informed consent was obtained. Prior                         Anticoagulants: The patient has taken no anticoagulant                         or antiplatelet agents except for aspirin . ASA Grade                         Assessment: II - A patient with mild systemic disease.                         After reviewing the risks and benefits, the patient                         was deemed in satisfactory condition to undergo the                         procedure.                        After obtaining informed consent, the colonoscope was  passed under direct vision. Throughout the procedure,                         the patient's blood pressure, pulse, and oxygen                         saturations were monitored continuously. The                          Colonoscope was introduced through the anus and                         advanced to the the cecum, identified by appendiceal                         orifice and ileocecal valve. The colonoscopy was                         performed without difficulty. The patient tolerated                         the procedure well. The quality of the bowel                         preparation was excellent. Findings:      The perianal and digital rectal examinations were normal.      A 3 mm polyp was found in the cecum. The polyp was sessile. The polyp       was removed with a cold biopsy forceps. Resection and retrieval were       complete.      Six sessile polyps were found in the transverse colon. The polyps were 3       to 4 mm in size. These polyps were removed with a cold snare. Resection       and retrieval were complete.      A polyp was found in the rectum. The polyp was sessile. The polyp was       removed with a cold snare. Resection and retrieval were complete. Impression:            - One 3 mm polyp in the cecum, removed with a cold                         biopsy forceps. Resected and retrieved.                        - Six 3 to 4 mm polyps in the transverse colon,                         removed with a cold snare. Resected and retrieved.                        - One polyp in the rectum, removed with a cold snare.                         Resected and retrieved. Recommendation:        - Discharge patient to home.                        -  Resume previous diet.                        - Continue present medications.                        - Await pathology results.                        - If the pathology report reveals adenomatous tissue,                         then repeat the colonoscopy for surveillance in 1 year. Procedure Code(s):     --- Professional ---                        [phone], Colonoscopy, flexible; with removal of                         tumor(s), polyp(s), or other  lesion(s) by snare                         technique                        [id number], 59, Colonoscopy, flexible; with biopsy, single                         or multiple Diagnosis Code(s):     --- Professional ---                        Z12.11, Encounter for screening for malignant neoplasm                         of colon                        D12.8, Benign neoplasm of rectum CPT copyright 2022 American Medical Association. All rights reserved. The codes documented in this report are preliminary and upon coder review may  be revised to meet current compliance requirements. Rogelia Copping MD, MD 11/06/2023 11:31:21 AM This report has been signed electronically. Number of Addenda: 0 Note Initiated On: 11/06/2023 10:54 AM Estimated Blood Loss:  Estimated blood loss: none.      [facility]

## 2023-11-06 NOTE — Transfer of Care (Signed)
 Immediate Anesthesia Transfer of Care Note  Patient: [name]  Procedure(s) Performed: COLONOSCOPY POLYPECTOMY, INTESTINE  Patient Location: Endoscopy Unit  Anesthesia Type:General  Level of Consciousness: awake  Airway & Oxygen Therapy: Patient Spontanous Breathing  Post-op Assessment: Report given to RN and Post -op Vital signs reviewed and stable  Post vital signs: Reviewed and stable  Last Vitals:  Vitals Value Taken Time  BP 101/71 11/06/23 11:27  Temp    Pulse 101 11/06/23 11:27  Resp 17 11/06/23 11:27  SpO2 95 % 11/06/23 11:27  Vitals shown include unfiled device data.  Last Pain:  Vitals:   11/06/23 1100  TempSrc: Temporal  PainSc:          Complications: No notable events documented.

## 2023-11-06 NOTE — Anesthesia Preprocedure Evaluation (Addendum)
 Anesthesia Evaluation  Patient identified by MRN, date of birth, ID band Patient awake    Reviewed: Allergy & Precautions, NPO status , Patient's Chart, lab work & pertinent test results  Airway Mallampati: II  TM Distance: >3 FB Neck ROM: Full    Dental  (+) Teeth Intact   Pulmonary neg pulmonary ROS, COPD, Current Smoker   Pulmonary exam normal        Cardiovascular Exercise Tolerance: Good hypertension, Pt. on medications + Peripheral Vascular Disease  negative cardio ROS Normal cardiovascular exam Rhythm:Regular Rate:Normal     Neuro/Psych     Bipolar Disorder   negative neurological ROS  negative psych ROS   GI/Hepatic negative GI ROS, Neg liver ROS,,,  Endo/Other  negative endocrine ROSdiabetes, Well Controlled, Type 2    Renal/GU negative Renal ROS  negative genitourinary   Musculoskeletal   Abdominal Normal abdominal exam  (+)   Peds negative pediatric ROS (+)  Hematology negative hematology ROS (+) Blood dyscrasia, anemia   Anesthesia Other Findings Past Medical History: No date: Anxiety No date: Bipolar disorder (HCC) No date: Chronic back pain No date: DDD (degenerative disc disease), lumbar No date: Depression No date: Hypertension No date: Pre-diabetes No date: PTSD (post-traumatic stress disorder)  Past Surgical History: No date: arm surgery     Comment:  right shoulder surgery No date: COLONOSCOPY No date: PARTIAL HYSTERECTOMY  BMI    Body Mass Index: 28.39 kg/m      Reproductive/Obstetrics negative OB ROS                              Anesthesia Physical Anesthesia Plan  ASA: 3  Anesthesia Plan: General   Post-op Pain Management:    Induction: Intravenous  PONV Risk Score and Plan: Propofol  infusion and TIVA  Airway Management Planned: Natural Airway and Nasal Cannula  Additional Equipment:   Intra-op Plan:   Post-operative Plan:    Informed Consent: I have reviewed the patients History and Physical, chart, labs and discussed the procedure including the risks, benefits and alternatives for the proposed anesthesia with the patient or authorized representative who has indicated his/her understanding and acceptance.     Dental Advisory Given  Plan Discussed with: CRNA  Anesthesia Plan Comments:         Anesthesia Quick Evaluation
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
## 2023-11-06 NOTE — Anesthesia Postprocedure Evaluation (Signed)
 Anesthesia Post Note  Patient: [name]  Procedure(s) Performed: COLONOSCOPY POLYPECTOMY, INTESTINE  Patient location during evaluation: PACU Anesthesia Type: General Level of consciousness: awake Pain management: satisfactory to patient Vital Signs Assessment: post-procedure vital signs reviewed and stable Respiratory status: spontaneous breathing Cardiovascular status: stable Anesthetic complications: no   No notable events documented.   Last Vitals:  Vitals:   11/06/23 1137 11/06/23 1140  BP: 117/70   Pulse:    Resp:    Temp:    SpO2: 100% 100%    Last Pain:  Vitals:   11/06/23 1140  TempSrc:   PainSc: 0-No pain                 VAN STAVEREN,Preesha Benjamin

## 2023-11-07 LAB — SURGICAL PATHOLOGY

## 2023-11-10 ENCOUNTER — Ambulatory Visit: Payer: Self-pay | Admitting: Gastroenterology

## 2023-12-17 ENCOUNTER — Encounter: Payer: Self-pay | Admitting: *Deleted

## 2023-12-19 ENCOUNTER — Ambulatory Visit: Attending: Cardiology | Admitting: Cardiology

## 2023-12-19 ENCOUNTER — Encounter: Payer: Self-pay | Admitting: Cardiology

## 2023-12-19 VITALS — BP 160/100 | HR 101 | Ht 64.0 in | Wt 164.8 lb

## 2023-12-19 DIAGNOSIS — R0602 Shortness of breath: Secondary | ICD-10-CM

## 2023-12-19 DIAGNOSIS — I10 Essential (primary) hypertension: Secondary | ICD-10-CM

## 2023-12-19 DIAGNOSIS — R072 Precordial pain: Secondary | ICD-10-CM | POA: Diagnosis not present

## 2023-12-19 MED ORDER — CARVEDILOL 25 MG PO TABS: 25.0000 mg | ORAL_TABLET | Freq: Two times a day (BID) | ORAL | 3 refills | Status: AC

## 2023-12-19 NOTE — Progress Notes (Signed)
 thr

## 2023-12-19 NOTE — Patient Instructions (Addendum)
 Medication Instructions:  Your physician has recommended you make the following change in your medication:  INCREASE: Coreg  25 mg twice daily  *If you need a refill on your cardiac medications before your next appointment, please call your pharmacy*   Testing/Procedures:   Your cardiac CT will be scheduled at one of the below locations:   [facility] [address]  [place], [place] [address]   If scheduled at the [facility] at [address], please enter the parking lot using the [address] entrance and use the FREE valet service at the patient drop-off area. Enter the building and check-in with registration on the main floor.   Please follow these instructions carefully (unless otherwise directed):  An IV will be required for this test and Nitroglycerin  will be given.  Hold all erectile dysfunction medications at least 3 days (72 hrs) prior to test. (Ie viagra, cialis, sildenafil, tadalafil, etc)   On the Night Before the Test: Be sure to Drink plenty of water. Do not consume any caffeinated/decaffeinated beverages or chocolate 12 hours prior to your test. Do not take any antihistamines 12 hours prior to your test.   On the Day of the Test: Drink plenty of water until 1 hour prior to the test. Do not eat any food 1 hour prior to test. You may take your regular medications prior to the test.  Take Coreg  two hours prior to test. If you take Furosemide /Hydrochlorothiazide/Spironolactone/Chlorthalidone, please HOLD on the morning of the test. Patients who wear a continuous glucose monitor MUST remove the device prior to scanning. FEMALES- please wear underwire-free bra if available, avoid dresses & tight clothing       After the Test: Drink plenty of water. After receiving IV contrast, you may experience a mild flushed feeling. This is normal. On occasion, you may experience a mild rash up to 24 hours after the test. This is  not dangerous. If this occurs, you can take Benadryl 25 mg, Zyrtec, Claritin, or Allegra and increase your fluid intake. (Patients taking Tikosyn should avoid Benadryl, and may take Zyrtec, Claritin, or Allegra) If you experience trouble breathing, this can be serious. If it is severe call 911 IMMEDIATELY. If it is mild, please call our office.  We will call to schedule your test 2-4 weeks out understanding that some insurance companies will need an authorization prior to the service being performed.   For more information and frequently asked questions, please visit our website : [email or web address]  For non-scheduling related questions, please contact the cardiac imaging nurse navigator should you have any questions/concerns: Cardiac Imaging Nurse Navigators Direct Office Dial: [phone]   For scheduling needs, including cancellations and rescheduling, please call Brittany, [phone].   Your physician has recommended that you have a pulmonary function test. Pulmonary Function Tests are a group of tests that measure how well air moves in and out of your lungs.  Pulmonary Function Tests Pulmonary function tests (PFTs) are breathing tests that are used to: Measure how well your lungs work. Find out what is causing your lung problems. Find the best treatment for you. You may have PFTs: If you have a condition that affects your lungs, such as asthma or chronic obstructive pulmonary disease (COPD). To watch for changes in your lung function over time if you have a long-term (chronic) lung disease. If you are an it trainer. PFTs check the effects of being exposed to chemicals over a long period of time. To  check lung function: Before having surgery or other procedures. If you smoke. To check if prescribed medicines or treatments are helping your lungs. Tell a health care provider about: Any allergies you have. All medicines you are taking, including inhaler  or nebulizer medicines, vitamins, herbs, eye drops, creams, and over-the-counter medicines. Any bleeding problems you have. Any surgeries you have had, especially recent surgery of the eye, abdomen, or chest. These can make PFTs difficult or unsafe. Any medical conditions you have, including chest pain or heart problems, tuberculosis, or respiratory infections such as pneumonia, a cold, or the flu. Any fear of being in closed spaces (claustrophobia). Some of your tests may be in a closed space. What are the risks? Your health care provider will talk with you about risks. These may include: Feeling light-headed due to fast, deep breathing known as overbreathing or hyperventilation. An asthma attack from deep breathing. What happens before the test? Take over-the-counter and prescription medicines only as told by your health care provider. If you take inhaler or nebulizer medicines, ask your health care provider which medicines you should take on the day of your testing. Some inhaler medicines may interfere with PFTs if they are taken shortly before the tests. Follow instructions from your health care provider about what you may eat and drink. These may include: Avoiding eating large meals. Avoiding using caffeine before the testing. Not drinkingalcohol for up to 4 hours before the test. Do not use any products that contain nicotine  or tobacco for up to 4 hours before your test. These products include cigarettes, chewing tobacco, and vaping devices, such as e-cigarettes. These can affect your test results. If you need help quitting, ask your health care provider. Wear comfortable clothing that will not get in the way of your breathing. Avoid exercise that takes a lot of effort (strenuous exercise) for at least 30 minutes before the test. What happens during the test?  You will be given: A soft noseclip to wear. This allows all of your breaths to go through your mouth instead of your nose. A  germ-free (sterile) mouthpiece. It will be attached to a spirometer machine that measures your breathing. You will be asked to do breathing exercises. The exercises will be done by breathing in (inhaling) and breathing out (exhaling). You may have to repeat the exercises many times before the testing is complete. You will need to follow instructions exactly as told to get accurate results. Make sure to blow as hard and as fast as you can when you are told to do so. You may be given a medicine called a bronchodilator. This makes the small air passages in your lungs larger so you can breathe easier. The tests will be repeated after the medicine takes effect. You will be watched for any problems, such as feeling faint or dizzy, or having trouble breathing. The procedure may vary among health care providers and hospitals. What can I expect after the test? Your results will be compared with the expected lung function of someone with healthy lungs who is similar to you in several ways. These ways include age, sex, height, weight, and race or ethnicity. This is done to show how your lung function compares with normal lung function (percent predicted). The percent predicted helps your health care provider know if your lung function is normal or not. If you have had PFTs done before, your health care provider will compare your current results with past results. This shows if your lung function is better,  worse, or the same as before. It is up to you to get the results of your procedure. Ask your health care provider, or the department that is doing the procedure, when your results will be ready. After you get your results, talk with your health care provider about treatment options, if necessary. This information is not intended to replace advice given to you by your health care provider. Make sure you discuss any questions you have with your health care provider. Document Revised: 08/27/2021 Document Reviewed:  08/27/2021 Elsevier Patient Education  2024 Elsevier Inc.  Follow-Up: At [facility], you and your health needs are our priority.  As part of our continuing mission to provide you with exceptional heart care, our providers are all part of one team.  This team includes your primary Cardiologist (physician) and Advanced Practice Providers or APPs (Physician Assistants and Nurse Practitioners) who all work together to provide you with the care you need, when you need it.  Your next appointment:   16 week(s)  Provider:   Kardie Tobb, DO    Please see our pharmacy staff in 4 weeks for blood pressure monitoring.   Please take your blood pressure daily for 2 weeks bring readings to your next appointment.   HOW TO TAKE YOUR BLOOD PRESSURE: Rest 5 minutes before taking your blood pressure. Don't smoke or drink caffeinated beverages for at least 30 minutes before. Take your blood pressure before (not after) you eat. Sit comfortably with your back supported and both feet on the floor (don't cross your legs). Elevate your arm to heart level on a table or a desk. Use the proper sized cuff. It should fit smoothly and snugly around your bare upper arm. There should be enough room to slip a fingertip under the cuff. The bottom edge of the cuff should be 1 inch above the crease of the elbow. Ideally, take 3 measurements at one sitting and record the average.  Other Instructions:
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
## 2023-12-26 ENCOUNTER — Ambulatory Visit (HOSPITAL_COMMUNITY)
Admission: RE | Admit: 2023-12-26 | Discharge: 2023-12-26 | Disposition: A | Discharge status: Home / Self Care | Source: Ambulatory Visit | Attending: Internal Medicine | Admitting: Internal Medicine

## 2023-12-26 DIAGNOSIS — R072 Precordial pain: Secondary | ICD-10-CM | POA: Diagnosis present

## 2023-12-26 LAB — POCT I-STAT CREATININE: Creatinine, Ser: 1.6 mg/dL — ABNORMAL HIGH (ref 0.44–1.00)

## 2023-12-26 MED ORDER — DILTIAZEM HCL 25 MG/5ML IV SOLN
10.0000 mg | INTRAVENOUS | Status: DC | PRN
  Administered 2023-12-26: 10 mg via INTRAVENOUS

## 2023-12-26 MED ORDER — IOHEXOL 350 MG/ML SOLN
95.0000 mL | Freq: Once | INTRAVENOUS | Status: AC | PRN
  Administered 2023-12-26: 95 mL via INTRAVENOUS

## 2023-12-26 MED ORDER — NITROGLYCERIN 0.4 MG SL SUBL
0.8000 mg | SUBLINGUAL_TABLET | Freq: Once | SUBLINGUAL | Status: AC
  Administered 2023-12-26: 0.8 mg via SUBLINGUAL

## 2023-12-26 MED ORDER — METOPROLOL TARTRATE 5 MG/5ML IV SOLN
10.0000 mg | INTRAVENOUS | Status: DC | PRN
  Administered 2023-12-26: 10 mg via INTRAVENOUS

## 2023-12-29 ENCOUNTER — Ambulatory Visit: Payer: Self-pay | Admitting: Cardiology

## 2024-01-01 ENCOUNTER — Other Ambulatory Visit: Payer: Self-pay

## 2024-01-01 DIAGNOSIS — R072 Precordial pain: Secondary | ICD-10-CM

## 2024-01-01 NOTE — Progress Notes (Signed)
 CTA placed for November 2026 per Dr. Ginette request.

## 2024-01-06 ENCOUNTER — Ambulatory Visit (HOSPITAL_COMMUNITY)
Admission: RE | Admit: 2024-01-06 | Discharge: 2024-01-06 | Disposition: A | Discharge status: Home / Self Care | Source: Ambulatory Visit | Attending: Cardiology | Admitting: Cardiology

## 2024-01-06 DIAGNOSIS — R0602 Shortness of breath: Secondary | ICD-10-CM | POA: Insufficient documentation

## 2024-01-06 LAB — PULMONARY FUNCTION TEST
DL/VA % pred: 77 %
DL/VA: 3.24 ml/min/mmHg/L
DLCO unc % pred: 57 %
DLCO unc: 11.97 ml/min/mmHg
FEF 25-75 Post: 2.89 L/s
FEF 25-75 Pre: 2.75 L/s
FEF2575-%Change-Post: 5 %
FEF2575-%Pred-Post: 119 %
FEF2575-%Pred-Pre: 113 %
FEV1-%Change-Post: 3 %
FEV1-%Pred-Post: 107 %
FEV1-%Pred-Pre: 104 %
FEV1-Post: 2.83 L
FEV1-Pre: 2.74 L
FEV1FVC-%Change-Post: 4 %
FEV1FVC-%Pred-Pre: 101 %
FEV6-%Change-Post: 0 %
FEV6-%Pred-Post: 104 %
FEV6-%Pred-Pre: 104 %
FEV6-Post: 3.43 L
FEV6-Pre: 3.42 L
FEV6FVC-%Change-Post: 0 %
FEV6FVC-%Pred-Post: 103 %
FEV6FVC-%Pred-Pre: 102 %
FVC-%Change-Post: 0 %
FVC-%Pred-Post: 101 %
FVC-%Pred-Pre: 101 %
FVC-Post: 3.43 L
FVC-Pre: 3.46 L
Post FEV1/FVC ratio: 83 %
Post FEV6/FVC ratio: 100 %
Pre FEV1/FVC ratio: 79 %
Pre FEV6/FVC Ratio: 99 %
RV % pred: 51 %
RV: 1.02 L
TLC % pred: 88 %
TLC: 4.54 L

## 2024-01-06 MED ORDER — ALBUTEROL SULFATE (2.5 MG/3ML) 0.083% IN NEBU
2.5000 mg | INHALATION_SOLUTION | Freq: Once | RESPIRATORY_TRACT | Status: AC
  Administered 2024-01-06: 2.5 mg via RESPIRATORY_TRACT

## 2024-01-06 NOTE — Telephone Encounter (Signed)
 Results/message from Dr. Emmette Harms has been released to MyChart. A letter is being sent to the last known home address.

## 2024-01-21 ENCOUNTER — Ambulatory Visit: Admitting: Podiatry

## 2024-01-21 ENCOUNTER — Encounter: Payer: Self-pay | Admitting: Podiatry

## 2024-01-21 ENCOUNTER — Ambulatory Visit

## 2024-01-21 DIAGNOSIS — D2371 Other benign neoplasm of skin of right lower limb, including hip: Secondary | ICD-10-CM | POA: Diagnosis not present

## 2024-01-21 DIAGNOSIS — M150 Primary generalized (osteo)arthritis: Secondary | ICD-10-CM | POA: Insufficient documentation

## 2024-01-21 DIAGNOSIS — M79671 Pain in right foot: Secondary | ICD-10-CM

## 2024-01-21 DIAGNOSIS — G5602 Carpal tunnel syndrome, left upper limb: Secondary | ICD-10-CM | POA: Insufficient documentation

## 2024-01-21 DIAGNOSIS — R5383 Other fatigue: Secondary | ICD-10-CM | POA: Insufficient documentation

## 2024-01-21 DIAGNOSIS — M7751 Other enthesopathy of right foot: Secondary | ICD-10-CM | POA: Diagnosis not present

## 2024-01-21 DIAGNOSIS — D2370 Other benign neoplasm of skin of unspecified lower limb, including hip: Secondary | ICD-10-CM | POA: Diagnosis not present

## 2024-01-21 DIAGNOSIS — M79672 Pain in left foot: Secondary | ICD-10-CM | POA: Diagnosis not present

## 2024-01-21 DIAGNOSIS — G5601 Carpal tunnel syndrome, right upper limb: Secondary | ICD-10-CM | POA: Insufficient documentation

## 2024-01-21 DIAGNOSIS — M7750 Other enthesopathy of unspecified foot: Secondary | ICD-10-CM

## 2024-01-21 DIAGNOSIS — D2372 Other benign neoplasm of skin of left lower limb, including hip: Secondary | ICD-10-CM

## 2024-01-21 DIAGNOSIS — Z6841 Body Mass Index (BMI) 40.0 and over, adult: Secondary | ICD-10-CM | POA: Insufficient documentation

## 2024-01-21 NOTE — Progress Notes (Signed)
 Subjective:  Patient ID: [name], female    DOB: 12-11-1965,  MRN: [id number] HPI Chief Complaint  Patient presents with   Foot Pain    New patient - bilateral foot pain - painful lesions on the bottoms of both feet    58 y.o. female presents with the above complaint.   ROS: Denies fever chills nausea vomit muscle aches pains calf pain back pain chest pain shortness of breath.  Past Medical History:  Diagnosis Date   Anxiety    Bipolar disorder (HCC)    Chronic back pain    DDD (degenerative disc disease), lumbar    Depression    Hypertension    Pre-diabetes    PTSD (post-traumatic stress disorder)    Past Surgical History:  Procedure Laterality Date   arm surgery     right shoulder surgery   COLONOSCOPY     COLONOSCOPY N/A 11/06/2023   Procedure: COLONOSCOPY;  Surgeon: Jinny Carmine, MD;  Location: [facility];  Service: Endoscopy;  Laterality: N/A;   PARTIAL HYSTERECTOMY     POLYPECTOMY  11/06/2023   Procedure: POLYPECTOMY, INTESTINE;  Surgeon: Jinny Carmine, MD;  Location: [facility];  Service: Endoscopy;;    Current Outpatient Medications:    diclofenac Sodium (VOLTAREN) 1 % GEL, APPLY 2 GRAMS TO THE AFFECTED AREA(S) BY TOPICAL ROUTE 4 TIMES PER DAY, Disp: , Rfl:    nystatin cream (MYCOSTATIN), SMARTSIG:sparingly Topical 3 Times Daily PRN, Disp: , Rfl:    predniSONE  (DELTASONE ) 10 MG tablet, Take 10 mg by mouth daily., Disp: , Rfl:    albuterol  (VENTOLIN  HFA) 108 (90 Base) MCG/ACT inhaler, Inhale into the lungs., Disp: , Rfl:    amLODipine  (NORVASC ) 10 MG tablet, Take 1 tablet (10 mg total) by mouth daily., Disp: 90 tablet, Rfl: 1   aspirin  EC 81 MG tablet, Take 81 mg by mouth daily., Disp: , Rfl:    atorvastatin (LIPITOR) 20 MG tablet, Take 20 mg by mouth daily., Disp: , Rfl:    buPROPion (WELLBUTRIN) 100 MG tablet, Take 100 mg by mouth daily., Disp: , Rfl:    carvedilol  (COREG ) 25 MG tablet, Take 1 tablet (25 mg total) by mouth 2 (two) times daily., Disp:  180 tablet, Rfl: 3   cholecalciferol  (VITAMIN D3) 25 MCG (1000 UNIT) tablet, Take 2,000 Units by mouth daily., Disp: , Rfl:    cilostazol  (PLETAL ) 50 MG tablet, Take 1 tablet (50 mg total) by mouth 2 (two) times daily., Disp: 180 tablet, Rfl: 1   cloNIDine  (CATAPRES ) 0.2 MG tablet, Take 1 tablet (0.2 mg total) by mouth 2 (two) times daily., Disp: 180 tablet, Rfl: 3   diazepam (VALIUM) 10 MG tablet, , Disp: , Rfl:    ENTRESTO  24-26 MG, Take 1 tablet by mouth twice daily, Disp: 60 tablet, Rfl: 11   FARXIGA  10 MG TABS tablet, TAKE 1 TABLET BY MOUTH EVERY DAY, Disp: 30 tablet, Rfl: 10   furosemide  (LASIX ) 20 MG tablet, Take 1 tablet by mouth daily., Disp: 90 tablet, Rfl: 3   gabapentin  (NEURONTIN ) 300 MG capsule, Take 300 mg by mouth 3 (three) times daily., Disp: , Rfl:    ibuprofen  (ADVIL ) 800 MG tablet, Take 800 mg by mouth 3 (three) times daily., Disp: , Rfl:    isosorbide-hydrALAZINE  (BIDIL) 20-37.5 MG tablet, Take 1 tablet by mouth 3 times a day, Disp: 90 tablet, Rfl: 7   meloxicam (MOBIC) 7.5 MG tablet, Take 7.5 mg by mouth daily as needed., Disp: , Rfl:    MYRBETRIQ 25  MG TB24 tablet, Take 25 mg by mouth daily., Disp: , Rfl:    OLANZapine (ZYPREXA) 10 MG tablet, Take 10 mg by mouth at bedtime., Disp: , Rfl:    ondansetron  (ZOFRAN ) 4 MG tablet, Take 4 mg by mouth every 8 (eight) hours as needed., Disp: , Rfl:    oxyCODONE (OXY IR/ROXICODONE) 5 MG immediate release tablet, Take 5 mg by mouth every 4 (four) hours as needed., Disp: , Rfl:    OZEMPIC, 1 MG/DOSE, 4 MG/3ML SOPN, Inject 1 mg into the skin once a week., Disp: , Rfl:    QUEtiapine  (SEROQUEL ) 400 MG tablet, Take 400 mg by mouth at bedtime. , Disp: , Rfl: 1   topiramate  (TOPAMAX ) 200 MG tablet, Take 200 mg by mouth daily. , Disp: , Rfl:    traMADol (ULTRAM) 50 MG tablet, 50 mg every 6 (six) hours as needed., Disp: , Rfl:    traZODone  (DESYREL ) 100 MG tablet, Take 200 mg by mouth at bedtime. , Disp: , Rfl:   Allergies  Allergen  Reactions   Egg Solids, Whole Nausea Only   Tape Rash and Dermatitis   Review of Systems Objective:  There were no vitals filed for this visit.  General: Well developed, nourished, in no acute distress, alert and oriented x3   Dermatological: Skin is warm, dry and supple bilateral. Nails x 10 are well maintained; remaining integument appears unremarkable at this time. There are no open sores, no preulcerative lesions, no rash or signs of infection present.  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 Pedal hair growth present. No varicosities and no lower extremity edema present bilateral.   Neruologic: Grossly intact via light touch bilateral. Vibratory intact via tuning fork bilateral. Protective threshold with Semmes Wienstein monofilament intact to all pedal sites bilateral. Patellar and Achilles deep tendon reflexes 2+ bilateral. No Babinski or clonus noted bilateral.   Musculoskeletal: No gross boney pedal deformities bilateral. No pain, crepitus, or limitation noted with foot and ankle range of motion bilateral. Muscular strength 5/5 in all groups tested bilateral.  She has mild pain on palpation of posterior tibial tendon bilateral and some tenderness on plantar fascial palpation.  Gait: Unassisted, Nonantalgic.    Radiographs:  None taken  Assessment & Plan:   Assessment: Benign neoplasms plantar aspect bilateral foot.  Posterior tibial tendinitis.  Plan: Debridement of benign neoplasms plantar aspect bilateral foot with chemical destruction of lesions with Salinocaine under occlusion x 3 days before being washed off.     Noelly Lasseigne T. [place], [place]

## 2024-02-13 ENCOUNTER — Ambulatory Visit: Attending: Cardiology | Admitting: Pharmacist Clinician (PhC)/ Clinical Pharmacy Specialist

## 2024-02-13 ENCOUNTER — Encounter: Payer: Self-pay | Admitting: Pharmacist Clinician (PhC)/ Clinical Pharmacy Specialist

## 2024-02-13 VITALS — BP 122/86 | HR 90

## 2024-02-13 DIAGNOSIS — I10 Essential (primary) hypertension: Secondary | ICD-10-CM | POA: Diagnosis not present

## 2024-02-13 NOTE — Progress Notes (Signed)
 "  Office Visit    Patient Name: [name] Date of Encounter: 02/13/2024  Primary Care Provider:  [facility] Primary Cardiologist:  Dub Huntsman, DO  Chief Complaint    Hypertension  Significant Past Medical History   CAD 11/25 CAC = 34.8 (86th percentile) - 25-49% large ramus  preDM 2/25 A1c down to 5.6 (had been as high as 6.3), on dapagliflozin , Ozempic  HFpEF Echo 3/23 EF at 60-65% with mildly dilated LV, concentric severe LV hypertrophy, grade II diastolic dysfunction  HLD 2/25 LDL 115  CKD 7/25 - 3b GFR 36  PAD On ASA, cilostazol  followed by Dr. Court  HFpEF On Entresto , carvedilol , dapagliflozin     Allergies[1]  History of Present Illness    [name] is a 58 y.o. female patient of Dr Huntsman, in the office today for hypertension evaluation.  She was seen by Dr. Huntsman in October for concerns of chest pain.  Her BP in the office that day was 160/100.  Carvedilol  was increased to 25 mg bid.    Today she is in the office for follow up.  She notes that when she saw Dr. Huntsman she had not taken her morning medications and that she thought home readings were much better.  When discussed the desire to see diastolic readings < 80, she noted that they'd never been that low.  Currently checking her BP once daily with device from PCP.  Working on program with them to check weight, O2 and BP daily, and follow exercise regimen three days per week for 1 hour.  Says it's a 12 week program and she is about 8 weeks in.  Has the option to continue after the initial 12 weeks.    Blood Pressure Goal:  130/80  Current Medications:  amlodipine  10 mg daily, carvedilol  25 mg bid, clonidine  0.2 mg bid, Entresto  24/26 mg bid  Family Hx: unsure about parents health, several siblings, no know heart issues, 4 kids, no issues 66-40 in age    Social Hx:      Tobacco: about 1/2 ppd  Alcohol: few times per year  Caffeine: coffee in the winter, occasional sweet tea in hot weather, no  soda  Diet:  more home cooked meals; protein mostly chicken, occasional pork and beef , vegetables canned and rinsed; snacking - sweets (cupcakes)  Exercise: in program -some resistance/stretching/ tension bands - 1 hour 3 days per week; aquatic therapy twice weekly for 1 hour  Home BP readings:  December readings (19) average 137/90 with range of 116-156/79-98  Adherence Assessment  Do you ever forget to take your medication? [] Yes [x] No  Do you ever skip doses due to side effects? [] Yes [x] No  Do you have trouble affording your medicines? [x] Yes - B12 injections not covered, gets when able [] No  Are you ever unable to pick up your medication due to transportation difficulties? [] Yes [x] No   Adherence strategy: has adherence packaging  Accessory Clinical Findings    Lab Results  Component Value Date   CREATININE 1.60 (H) 12/26/2023   BUN 15 05/13/2023   NA 142 05/13/2023   K 3.9 05/13/2023   CL 104 05/13/2023   CO2 22 05/13/2023   Lab Results  Component Value Date   ALT 17 05/17/2021   AST 19 05/17/2021   ALKPHOS 81 05/17/2021   BILITOT 0.3 05/17/2021   Lab Results  Component Value Date   HGBA1C 5.9 (H) 05/17/2021    Home Medications    Current Outpatient Medications  Medication Sig Dispense Refill   albuterol  (VENTOLIN  HFA) 108 (90 Base) MCG/ACT inhaler Inhale into the lungs.     amLODipine  (NORVASC ) 10 MG tablet Take 1 tablet (10 mg total) by mouth daily. 90 tablet 1   aspirin  EC 81 MG tablet Take 81 mg by mouth daily.     atorvastatin (LIPITOR) 20 MG tablet Take 20 mg by mouth daily.     buPROPion (WELLBUTRIN) 100 MG tablet Take 100 mg by mouth daily.     carvedilol  (COREG ) 25 MG tablet Take 1 tablet (25 mg total) by mouth 2 (two) times daily. 180 tablet 3   cholecalciferol  (VITAMIN D3) 25 MCG (1000 UNIT) tablet Take 2,000 Units by mouth daily.     cilostazol  (PLETAL ) 50 MG tablet Take 1 tablet (50 mg total) by mouth 2 (two) times daily. 180 tablet 1    cloNIDine  (CATAPRES ) 0.2 MG tablet Take 1 tablet (0.2 mg total) by mouth 2 (two) times daily. 180 tablet 3   diclofenac Sodium (VOLTAREN) 1 % GEL APPLY 2 GRAMS TO THE AFFECTED AREA(S) BY TOPICAL ROUTE 4 TIMES PER DAY     ENTRESTO  24-26 MG Take 1 tablet by mouth twice daily 60 tablet 11   FARXIGA  10 MG TABS tablet TAKE 1 TABLET BY MOUTH EVERY DAY 30 tablet 10   furosemide  (LASIX ) 20 MG tablet Take 1 tablet by mouth daily. 90 tablet 3   gabapentin  (NEURONTIN ) 300 MG capsule Take 300 mg by mouth 3 (three) times daily.     MYRBETRIQ 25 MG TB24 tablet Take 25 mg by mouth daily.     nystatin cream (MYCOSTATIN) SMARTSIG:sparingly Topical 3 Times Daily PRN     OLANZapine (ZYPREXA) 10 MG tablet Take 10 mg by mouth at bedtime.     oxyCODONE (OXY IR/ROXICODONE) 5 MG immediate release tablet Take 5 mg by mouth every 4 (four) hours as needed.     predniSONE  (DELTASONE ) 10 MG tablet Take 10 mg by mouth daily.     QUEtiapine  (SEROQUEL ) 400 MG tablet Take 400 mg by mouth at bedtime.   1   topiramate  (TOPAMAX ) 200 MG tablet Take 200 mg by mouth daily.      traMADol (ULTRAM) 50 MG tablet 50 mg every 6 (six) hours as needed.     traZODone  (DESYREL ) 100 MG tablet Take 200 mg by mouth at bedtime.      diazepam (VALIUM) 10 MG tablet      ibuprofen  (ADVIL ) 800 MG tablet Take 800 mg by mouth 3 (three) times daily.     No current facility-administered medications for this visit.         Assessment & Plan    Essential hypertension Assessment: BP is uncontrolled in office BP 122/86 mmHg;  above the diastolic goal (<130/80). Tolerates amlodipine , carvedilol , clonidine  and Entresto  well, without any side effects Denies SOB, palpitation, chest pain, headaches,or swelling Reiterated the importance of regular exercise and low salt diet   Plan:  Patient preference to continue taking current medications - she would like to get back to a home remedy that has worked in the past to keep her pressure down.  If not  successful, would encourage increasing Entresto  to 49/51.  Patient already with history of HFpEF, want to be more aggressive at lowering diastolic BP  Patient to keep record of BP readings with heart rate and report to us  at the next visit - encouraged checking readings occasionally in the evenings as well Patient to follow up with Dr. Sheena  in February  Labs ordered today:  none   Allean Mink PharmD CPP [facility]  [address] [place], [place] [address] [phone]       [1]  Allergies Allergen Reactions   Egg Solids, Whole Nausea Only   Tape Rash and Dermatitis   "

## 2024-02-13 NOTE — Assessment & Plan Note (Signed)
 Assessment: BP is uncontrolled in office BP 122/86 mmHg;  above the diastolic goal (<130/80). Tolerates amlodipine , carvedilol , clonidine  and Entresto  well, without any side effects Denies SOB, palpitation, chest pain, headaches,or swelling Reiterated the importance of regular exercise and low salt diet   Plan:  Patient preference to continue taking current medications - she would like to get back to a home remedy that has worked in the past to keep her pressure down.  If not successful, would encourage increasing Entresto  to 49/51.  Patient already with history of HFpEF, want to be more aggressive at lowering diastolic BP  Patient to keep record of BP readings with heart rate and report to us  at the next visit - encouraged checking readings occasionally in the evenings as well Patient to follow up with Dr. Sheena in February  Labs ordered today:  none

## 2024-02-13 NOTE — Patient Instructions (Addendum)
 Follow up appointment: with Dr. Sheena in February  Take your BP meds as follows: continue with all your current medications  Check your blood pressure at home daily and keep record of the readings.  Your blood pressure goal is < 130/80  To check your pressure at home you will need to:  1. Sit up in a chair, with feet flat on the floor and back supported. Do not cross your ankles or legs. 2. Rest your left arm so that the cuff is about heart level. If the cuff goes on your upper arm,  then just relax the arm on the table, arm of the chair or your lap. If you have a wrist cuff, we  suggest relaxing your wrist against your chest (think of it as Pledging the Flag with the  wrong arm).  3. Place the cuff snugly around your arm, about 1 inch above the crook of your elbow. The  cords should be inside the groove of your elbow.  4. Sit quietly, with the cuff in place, for about 5 minutes. After that 5 minutes press the power  button to start a reading. 5. Do not talk or move while the reading is taking place.  6. Record your readings on a sheet of paper. Although most cuffs have a memory, it is often  easier to see a pattern developing when the numbers are all in front of you.  7. You can repeat the reading after 1-3 minutes if it is recommended  Make sure your bladder is empty and you have not had caffeine or tobacco within the last 30 min  Always bring your blood pressure log with you to your appointments. If you have not brought your monitor in to be double checked for accuracy, please bring it to your next appointment.  You can find a list of quality blood pressure cuffs at [email or web address]  Important lifestyle changes to control high blood pressure  Intervention  Effect on the BP  Lose extra pounds and watch your waistline Weight loss is one of the most effective lifestyle changes for controlling blood pressure. If you're overweight or obese, losing even a small amount of weight can help  reduce blood pressure. Blood pressure might go down by about 1 millimeter of mercury (mm Hg) with each kilogram (about 2.2 pounds) of weight lost.  Exercise regularly As a general goal, aim for at least 30 minutes of moderate physical activity every day. Regular physical activity can lower high blood pressure by about 5 to 8 mm Hg.  Eat a healthy diet Eating a diet rich in whole grains, fruits, vegetables, and low-fat dairy products and low in saturated fat and cholesterol. A healthy diet can lower high blood pressure by up to 11 mm Hg.  Reduce salt (sodium) in your diet Even a small reduction of sodium in the diet can improve heart health and reduce high blood pressure by about 5 to 6 mm Hg.  Limit alcohol One drink equals 12 ounces of beer, 5 ounces of wine, or 1.5 ounces of 80-proof liquor.  Limiting alcohol to less than one drink a day for women or two drinks a day for men can help lower blood pressure by about 4 mm Hg.   If you have any questions or concerns please use My Chart to send questions or call the office at [phone]

## 2024-02-23 ENCOUNTER — Telehealth: Payer: Self-pay | Admitting: Cardiology

## 2024-02-23 NOTE — Telephone Encounter (Signed)
 Spoke with patient and shared message from Dr. Sheena: Yes, that will be fine to hold the Lasix .   Patient verbalized understanding and expressed appreciation for follow-up.

## 2024-02-23 NOTE — Telephone Encounter (Signed)
 Spoke with pt, she reports the oncologist said her kidney numbers are elevated and she wants to know if she can stop the furosemide . She reports no problems with swelling and her weight has been stable. Aware dr sheena is not in the office today but will forward for her review.

## 2024-02-23 NOTE — Telephone Encounter (Signed)
 Pt c/o medication issue:  1. Name of Medication: furosemide  (LASIX ) 20 MG tablet   2. How are you currently taking this medication (dosage and times per day)? Take 1 tablet by mouth daily.   3. Are you having a reaction (difficulty breathing--STAT)? No  4. What is your medication issue? Pts oncologist says she should stop taking medication due to high kidney levels. Pt wanted to check with Dr. Sheena first. Please advise.

## 2024-03-16 ENCOUNTER — Ambulatory Visit: Admitting: Cardiovascular Disease

## 2024-03-19 ENCOUNTER — Other Ambulatory Visit (HOSPITAL_COMMUNITY)

## 2024-03-24 ENCOUNTER — Ambulatory Visit: Admitting: Podiatry

## 2024-03-30 ENCOUNTER — Ambulatory Visit: Admitting: Cardiovascular Disease

## 2024-04-14 ENCOUNTER — Ambulatory Visit: Admitting: Cardiology

## 2024-04-21 ENCOUNTER — Ambulatory Visit: Admitting: Podiatry

## 2025-01-03 ENCOUNTER — Other Ambulatory Visit (HOSPITAL_COMMUNITY)
